# Patient Record
Sex: Female | Born: 1945 | Race: White | Hispanic: No | Marital: Married | Smoking: Never smoker
Health system: Southern US, Community
[De-identification: ages and names within clinical notes are randomized; demographics above are authoritative.]

## PROBLEM LIST (undated history)

## (undated) DIAGNOSIS — K219 Gastro-esophageal reflux disease without esophagitis: Secondary | ICD-10-CM

## (undated) DIAGNOSIS — I10 Essential (primary) hypertension: Secondary | ICD-10-CM

## (undated) DIAGNOSIS — G4700 Insomnia, unspecified: Secondary | ICD-10-CM

## (undated) DIAGNOSIS — E785 Hyperlipidemia, unspecified: Secondary | ICD-10-CM

## (undated) DIAGNOSIS — K559 Vascular disorder of intestine, unspecified: Secondary | ICD-10-CM

## (undated) DIAGNOSIS — T7840XA Allergy, unspecified, initial encounter: Secondary | ICD-10-CM

## (undated) DIAGNOSIS — M8580 Other specified disorders of bone density and structure, unspecified site: Secondary | ICD-10-CM

## (undated) HISTORY — DX: Other specified disorders of bone density and structure, unspecified site: M85.80

## (undated) HISTORY — DX: Essential (primary) hypertension: I10

## (undated) HISTORY — DX: Gastro-esophageal reflux disease without esophagitis: K21.9

## (undated) HISTORY — DX: Allergy, unspecified, initial encounter: T78.40XA

## (undated) HISTORY — DX: Vascular disorder of intestine, unspecified: K55.9

## (undated) HISTORY — PX: BREAST EXCISIONAL BIOPSY: SUR124

## (undated) HISTORY — PX: EYE SURGERY: SHX253

## (undated) HISTORY — PX: BREAST SURGERY: SHX581

## (undated) HISTORY — DX: Insomnia, unspecified: G47.00

## (undated) HISTORY — PX: BREAST BIOPSY: SHX20

## (undated) HISTORY — DX: Hyperlipidemia, unspecified: E78.5

## (undated) HISTORY — PX: FINGER SURGERY: SHX640

## (undated) HISTORY — PX: APPENDECTOMY: SHX54

---

## 2004-01-04 HISTORY — PX: COLONOSCOPY: SHX174

## 2004-01-26 ENCOUNTER — Emergency Department (HOSPITAL_COMMUNITY): Admission: EM | Admit: 2004-01-26 | Discharge: 2004-01-26 | Payer: Self-pay | Admitting: Emergency Medicine

## 2004-05-10 ENCOUNTER — Other Ambulatory Visit: Admission: RE | Admit: 2004-05-10 | Discharge: 2004-05-10 | Payer: Self-pay | Admitting: Family Medicine

## 2004-06-28 ENCOUNTER — Ambulatory Visit: Payer: Self-pay | Admitting: Internal Medicine

## 2004-06-28 ENCOUNTER — Ambulatory Visit (HOSPITAL_COMMUNITY): Admission: RE | Admit: 2004-06-28 | Discharge: 2004-06-28 | Payer: Self-pay | Admitting: Internal Medicine

## 2005-01-20 ENCOUNTER — Other Ambulatory Visit: Admission: RE | Admit: 2005-01-20 | Discharge: 2005-01-20 | Payer: Self-pay | Admitting: Family Medicine

## 2005-02-09 ENCOUNTER — Encounter: Admission: RE | Admit: 2005-02-09 | Discharge: 2005-02-09 | Payer: Self-pay | Admitting: Family Medicine

## 2006-12-05 ENCOUNTER — Encounter: Admission: RE | Admit: 2006-12-05 | Discharge: 2006-12-05 | Payer: Self-pay | Admitting: Obstetrics

## 2007-07-02 ENCOUNTER — Ambulatory Visit: Payer: Self-pay | Admitting: Internal Medicine

## 2007-07-02 DIAGNOSIS — J309 Allergic rhinitis, unspecified: Secondary | ICD-10-CM | POA: Insufficient documentation

## 2007-07-02 DIAGNOSIS — K219 Gastro-esophageal reflux disease without esophagitis: Secondary | ICD-10-CM | POA: Insufficient documentation

## 2007-07-02 DIAGNOSIS — K5900 Constipation, unspecified: Secondary | ICD-10-CM | POA: Insufficient documentation

## 2009-01-03 DIAGNOSIS — K559 Vascular disorder of intestine, unspecified: Secondary | ICD-10-CM

## 2009-01-03 HISTORY — DX: Vascular disorder of intestine, unspecified: K55.9

## 2009-04-21 ENCOUNTER — Ambulatory Visit: Payer: Self-pay | Admitting: Family Medicine

## 2009-04-21 DIAGNOSIS — N959 Unspecified menopausal and perimenopausal disorder: Secondary | ICD-10-CM | POA: Insufficient documentation

## 2009-04-24 ENCOUNTER — Encounter: Admission: RE | Admit: 2009-04-24 | Discharge: 2009-04-24 | Payer: Self-pay | Admitting: Family Medicine

## 2009-05-13 ENCOUNTER — Ambulatory Visit: Payer: Self-pay | Admitting: Family Medicine

## 2009-05-13 ENCOUNTER — Ambulatory Visit: Payer: Self-pay | Admitting: Obstetrics & Gynecology

## 2009-05-13 DIAGNOSIS — J019 Acute sinusitis, unspecified: Secondary | ICD-10-CM | POA: Insufficient documentation

## 2009-05-14 LAB — CONVERTED CEMR LAB
ALT: 20 units/L (ref 0–35)
Albumin: 4.1 g/dL (ref 3.5–5.2)
Alkaline Phosphatase: 75 units/L (ref 39–117)
Glucose, Bld: 91 mg/dL (ref 70–99)
LDL Cholesterol: 93 mg/dL (ref 0–99)
Potassium: 4.6 meq/L (ref 3.5–5.3)
Sodium: 140 meq/L (ref 135–145)
TSH: 0.721 microintl units/mL (ref 0.350–4.500)
Total Bilirubin: 0.5 mg/dL (ref 0.3–1.2)
Total Protein: 7.6 g/dL (ref 6.0–8.3)
Triglycerides: 197 mg/dL — ABNORMAL HIGH (ref <150)
VLDL: 39 mg/dL (ref 0–40)

## 2010-01-03 HISTORY — PX: COLONOSCOPY: SHX174

## 2010-02-02 NOTE — Assessment & Plan Note (Signed)
Summary: SOB, sinusitis   Vital Signs:  Patient profile:   65 year old female Height:      64.5 inches Weight:      153 pounds O2 Sat:      96 % on Room air Temp:     98.2 degrees F oral Pulse rate:   86 / minute BP sitting:   142 / 85  (left arm) Cuff size:   regular  Vitals Entered By: Kathlene November (May 13, 2009 9:57 AM)  O2 Flow:  Room air CC: cough, chest congestion, nasal congestion, H/A for 2 days, bodyaches last week. Started last week   Primary Care Provider:  Nani Gasser, MD  CC:  cough, chest congestion, nasal congestion, H/A for 2 days, and bodyaches last week. Started last week.  History of Present Illness: cough, chest congestion, nasal congestion, H/A for 2 days, bodyaches last week. Started last week.  In bed for about 3 days because. Taking Advil and OTC medications.  INitally had some fever. Chest still feels raw.  Does feel SOB going up the steps today.  Was bitten by a tick a couple of weeks ago. Cough has not been keeping her awake at night. NO alleviating or exacerbating sxs.   Current Medications (verified): 1)  Prempro 0.3-1.5 Mg  Tabs (Conj Estrog-Medroxyprogest Ace) .Marland Kitchen.. 1 Once Daily 2)  Allegra-D 24 Hour 180-240 Mg  Tb24 (Fexofenadine-Pseudoephedrine) .Marland Kitchen.. 1 Once Daily 3)  Prilosec 20 Mg Cpdr (Omeprazole) .... Take One Tablet By Mouth Once A Day  Allergies (verified): No Known Drug Allergies  Comments:  Nurse/Medical Assistant: The patient's medications and allergies were reviewed with the patient and were updated in the Medication and Allergy Lists. Kathlene November (May 13, 2009 9:58 AM)  Physical Exam  General:  Well-developed,well-nourished,in no acute distress; alert,appropriate and cooperative throughout examination Head:  Normocephalic and atraumatic without obvious abnormalities. No apparent alopecia or balding. Eyes:  No corneal or conjunctival inflammation noted. EOMI. Perrla. Ears:  External ear exam shows no significant lesions or  deformities.  Otoscopic examination reveals clear canals, tympanic membranes are intact bilaterally without bulging, retraction, inflammation or discharge. Hearing is grossly normal bilaterally. Nose:  External nasal examination shows no deformity or inflammation. Nasal mucosa are pink and moist without lesions or exudates. Mouth:  Oral mucosa and oropharynx without lesions or exudates.  Teeth in good repair. Neck:  No deformities, masses, or tenderness noted. Lungs:  Rhonchi at the bases bilat.  normal respiratory effort and no intercostal retractions.  Good effort.  Heart:  Normal rate and regular rhythm. S1 and S2 normal without gallop, murmur, click, rub or other extra sounds. Skin:  no rashes.  no rashes.   Cervical Nodes:  No lymphadenopathy noted Psych:  Cognition and judgment appear intact. Alert and cooperative with normal attention span and concentration. No apparent delusions, illusions, hallucinations   Impression & Recommendations:  Problem # 1:  SINUSITIS - ACUTE-NOS (ICD-461.9)  Her updated medication list for this problem includes:    Allegra-d 24 Hour 180-240 Mg Tb24 (Fexofenadine-pseudoephedrine) .Marland Kitchen... 1 once daily    Zithromax Z-pak 250 Mg Tabs (Azithromycin) .Marland Kitchen... Take as directed.  Instructed on treatment. Call if symptoms persist or worsen. If not better in 5 days then will order CXR and pt can go downstairs for that. Encouraged her to seek prompt medical care if she becomes more SOB.    Complete Medication List: 1)  Prempro 0.3-1.5 Mg Tabs (Conj estrog-medroxyprogest ace) .Marland Kitchen.. 1 once daily 2)  Allegra-d  24 Hour 180-240 Mg Tb24 (Fexofenadine-pseudoephedrine) .Marland Kitchen.. 1 once daily 3)  Prilosec 20 Mg Cpdr (Omeprazole) .... Take one tablet by mouth once a day 4)  Zithromax Z-pak 250 Mg Tabs (Azithromycin) .... Take as directed.  Patient Instructions: 1)  IF not 50% better in 5 day please call us for a Chest xray.  Prescriptions: ZITHROMAX Z-PAK 250 MG TABS (AZITHROMYCIN)  Take as directed.  # 1 pack x 0   Entered and Authorized by:   Nani Gasser MD   Signed by:   Nani Gasser MD on 05/13/2009   Method used:   Printed then faxed to ...       [facility]       [address]       [place], [place]  [address]       Ph: [phone] or [phone]       Fax: [phone]   RxID:   [id number]

## 2010-02-02 NOTE — Assessment & Plan Note (Signed)
Summary: NOV: CPE   Vital Signs:  Patient profile:   65 year old female Height:      64.5 inches Weight:      153 pounds BMI:     25.95 Temp:     98.3 degrees F oral Pulse rate:   81 / minute BP sitting:   127 / 75  (left arm) Cuff size:   regular  Vitals Entered By: Kathlene November (April 21, 2009 8:33 AM) CC: NP- CPE no pap Is Patient Diabetic? No   Primary Care Provider:  Nani Gasser, MD  CC:  NP- CPE no pap.  History of Present Illness: NP- CPE no pap. No specific complaints. Says last CPE ws in 2009. Would like referal to GYN for her paps. SHe says she is supposed to have one every 6 months but last one was ove a year ago. Also says she is overdue for her mammogram. and often has cysts that are followed. Was also supposed to have a f/u in 6 months for that and she is a year overdue.    Habits & Providers  Alcohol-Tobacco-Diet     Alcohol drinks/day: <1     Alcohol type: wine     Tobacco Status: never  Exercise-Depression-Behavior     Does Patient Exercise: no     Have you felt down or hopeless? no     STD Risk: never     Drug Use: no     Seat Belt Use: always  Current Medications (verified): 1)  Prempro 0.3-1.5 Mg  Tabs (Conj Estrog-Medroxyprogest Ace) .Marland Kitchen.. 1 Once Daily 2)  Allegra-D 24 Hour 180-240 Mg  Tb24 (Fexofenadine-Pseudoephedrine) .Marland Kitchen.. 1 Once Daily 3)  Prilosec 20 Mg Cpdr (Omeprazole) .... Take One Tablet By Mouth Once A Day  Allergies (verified): No Known Drug Allergies  Past History:  Past Medical History: Last updated: 07/02/2007 Allergic rhinitis GERD  Past Surgical History: Appendectomy 1961 breast biopsy 1980s, 1996 benign growth removed from under 1999 Cryotherapy on the cervix  Colonoscopy.  2005 or  2006  Family History: Reviewed history from 07/02/2007 and no changes required. 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  Married for [employer] with one kids.   Never Smoked Alcohol use-yes Drug use-no Regular exercise-no 3 caffeinated drinks per day. Smoking Status:  never Does Patient Exercise:  no STD Risk:  never Drug Use:  no Seat Belt Use:  always  Review of Systems       No fever/sweats/weakness, unexplained weight loss/gain.  No vison changes.  No difficulty hearing/ringing in ears, + hay fever/allergies.  No chest pain/discomfort, palpitations.  No Br lump/nipple discharge.  + cough/wheeze.  + blood in BM, no nausea/vomiting/diarrhea.  No nighttime urination, leaking urine, unusual vaginal bleeding, discharge (penis or vagina).  No muscle/joint pain. No rash, change in mole.  No HA, memory loss.  No anxiety, sleep d/o, depression.  No easy bruising/bleeding, unexplained lump   Physical Exam  General:  Well-developed,well-nourished,in no acute distress; alert,appropriate and cooperative throughout examination Head:  Normocephalic and atraumatic without obvious abnormalities. No apparent alopecia or balding. Eyes:  No corneal or conjunctival inflammation noted. EOMI. Perrla.  Ears:  External ear exam shows no significant lesions or deformities.  Otoscopic examination reveals clear canals, tympanic membranes are intact bilaterally without bulging, retraction, inflammation or discharge. Hearing  is grossly normal bilaterally. Nose:  External nasal examination shows no deformity or inflammation. Nasal mucosa are pink and moist without lesions or exudates. Mouth:  Oral mucosa and oropharynx without lesions or exudates.  Teeth in good repair. Neck:  No deformities, masses, or tenderness noted. Chest Wall:  No deformities, masses, or tenderness noted. Breasts:  No mass, nodules, thickening, tenderness, bulging, retraction, inflamation, nipple discharge or skin changes noted.   Lungs:  Normal respiratory effort,  chest expands symmetrically. Lungs are clear to auscultation, no crackles or wheezes. Heart:  Normal rate and regular rhythm. S1 and S2 normal without gallop, murmur, click, rub or other extra sounds. Abdomen:  Bowel sounds positive,abdomen soft and non-tender without masses, organomegaly or hernias noted. Msk:  No deformity or scoliosis noted of thoracic or lumbar spine.   Pulses:  R and L carotid,radial,dorsalis pedis and posterior tibial pulses are full and equal bilaterally Extremities:  No clubbing, cyanosis, edema, or deformity noted with normal full range of motion of all joints.   Neurologic:  No cranial nerve deficits noted. Station and gait are normal.DTRs are symmetrical throughout. Sensory, motor and coordinative functions appear intact. Skin:  Cyst on DIP on the first finger on teh right hand.  Cervical Nodes:  No lymphadenopathy noted Axillary Nodes:  No palpable lymphadenopathy Psych:  Cognition and judgment appear intact. Alert and cooperative with normal attention span and concentration. No apparent delusions, illusions, hallucinations   Impression & Recommendations:  Problem # 1:  PREVENTIVE HEALTH CARE (ICD-V70.0)  Due for screeening labs.   Performed an EKG since she plans on starting an intense exercise program for weight loss aznd currently she is not very active. Denies any CP or SOB.  Father died MI at age 29.  EKG shows Rate of 63 bpm, no acute changes.   Schedule for DEXA. Given pneumovax and shingles vaccines today. Will schedule for diagnositic mamm[facility] [place].   Encourage daily calcium with vitamin D two times a day  Orders: T-Comprehensive Metabolic Panel [id number]) T-Lipid Profile ([id number]) T-TSH [id number]) T-Dual DXA Bone Density/ Axial (22025) T-Mammography, Diagnostic (bilateral) (42706)  Complete Medication List: 1)  Prempro 0.3-1.5 Mg Tabs (Conj estrog-medroxyprogest ace) .Marland Kitchen.. 1 once daily 2)  Allegra-d 24 Hour 180-240 Mg  Tb24 (Fexofenadine-pseudoephedrine) .Marland Kitchen.. 1 once daily 3)  Prilosec 20 Mg Cpdr (Omeprazole) .... Take one tablet by mouth once a day  Other Orders: Gynecologic Referral (Gyn) Zoster (Shingles) Vaccine Live [phone]) Admin 1st Vaccine (83151) Pneumococcal Vaccine (76160) Admin of Any Addtl Vaccine (73710)  Patient Instructions: 1)  Call [phone] to schedule your bone density test downstairs. Ask for the [facility] location.  2)  Can go to the lab Mon-Friday 8 AM - 5PM for fasting labs. Fast for 8 hours.  Can have water and take medications.  3)  Encourage daily calcium with vitamin D two times a day   TD Result Date:  01/03/2009 TD Result:  given TD Next Due:  10 yr Flex Sig Next Due:  Not Indicated Colonoscopy Result Date:  01/03/2006 Colonoscopy Result:  normal Colonoscopy Next Due:  10 yr Hemoccult Next Due:  Not Indicated   Immunizations Administered:  Zostavax # 1:    Vaccine Type: Zostavax    Site: left deltoid    Mfr: Merck    Dose: 0.44ml    Route: IM    Given by: Kathlene November    Exp. Date: 01/30/2010    Lot #: [id number]    VIS given: 10/15/04  given April 21, 2009.  Pneumonia Vaccine:    Vaccine Type: Pneumovax    Site: right deltoid    Mfr: Merck    Dose: 0.5 ml    Route: IM    Given by: Kathlene November    Exp. Date: 02/03/2010    Lot #: [id number]    VIS given: 08/01/95 version given April 21, 2009.

## 2010-05-18 NOTE — Assessment & Plan Note (Signed)
NAME:  Stacy Henry, Stacy Henry NO.:  [id number]   MEDICAL RECORD NO.:  [id number]          PATIENT TYPE:  POB   LOCATION:  [facility] [place]         FACILITY:  [facility]   PHYSICIAN:  Elsie Lincoln, MD      DATE OF BIRTH:  February 15, 1945   DATE OF SERVICE:  05/13/2009                                  CLINIC NOTE   The patient is a 65 year old G1, P44 female who presents for yearly exam.  The patient previously had gotten care from Dr. Clearance Coots.  She has been on  hormone replacement therapy about 10 years.  He has tried to wean her in  the past, but has been unsuccessful due to severe hot flashes.  She has  recently started taking the hormone replacement every other day.  I  explained to her that she is having peaks and drops in her estrogen  levels and she might be able to quit entirely and the patient will try.  She understands the risk of breast cancer and heart disease.  The  patient denies any postmenopausal bleeding.  She has been having a  leakage of urine with coughing due to her recent cold.  She has been  having to have a pad and has some perineal irritation.   PAST MEDICAL HISTORY:  New-onset arthritis, reflux.   PAST SURGICAL HISTORY:  Laparotomy for ruptured appendix, removal of  cyst in her tongue, and benign breast lump, removed.   OB HISTORY:  NSVD x1.   GYN HISTORY:  The patient has had an abnormal Pap smear, it sounds like  LEEP x2 in the recent history in past 15 years.  The patient will need  yearly Pap smears for the next 20 years after the last LEEP.  The  patient again is menopausal and has not had any postmenopausal bleeding.  The patient has never had any history of ovarian cyst, fibroid tumors,  endometriosis, or sexually transmitted diseases.   MEDICATIONS:  Prempro, over-the-counter Allegra-D, and Prevacid.   ALLERGIES:  None.   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  She lives with her  spouse.  She does not smoke.  She drinks alcohol socially.  She does not do drugs.  Has never been  sexually or physically abused.   FAMILY HISTORY:  Her brother has diabetes.  Her father and brother have  heart disease.  Her mother had high blood pressure and some aunt has  remote history of familial cancers, but no breast, colon, ovarian, or  uterine cancers in first-degree relatives.   SYSTEMIC REVIEW:  Positive weight gain in the past recent history,  weakness, fatigue, and fever due to her recent cough, hot flashes,  headache, cough, shortness of breath, muscle and joint pain, and vaginal  itching.   PHYSICAL EXAMINATION:  VITAL SIGNS:  Pulse 95, blood pressure 161/91 on  Allegra-D, weight 152, height 65 inches.  GENERAL:  Well nourished, well developed, in no apparent distress.  HEENT:  Normocephalic, atraumatic.  Good dentition.  Thyroid, no masses.  LUNGS:  Crackles bilaterally with scant wheezes.  HEART:  Regular rate and rhythm.  BREASTS:  No masses.  No lymphadenopathy.  No nipple discharge.  ABDOMEN:  Soft, nontender.  No organomegaly.  No hernia.  GENITALIA:  Perineum and enterogenous areas are very red and irritated  down by the rectum as well, otherwise tanner V.  Vagina pink, normal  rugae, well estrogenized.  Cervix closed, nontender.  Uterus and adnexa  are not well palpated, but nontender and no masses.  Rectovaginal,  nontender.  Stool in the vault.  No cystocele.  No rectocele.  No pelvic  organ prolapse.  No hemorrhoids.  EXTREMITIES:  No edema.   ASSESSMENT AND PLAN:  A 65 year old, well-woman exam.  1. Pap smear which will be required due to loop electrosurgical      excision procedures in the past 15 years.  2. Chest x-ray PA and lateral and the patient to follow up with [facility] today due to her crackles in her lungs.  3. The patient to come back in 3 weeks after she is not wearing pads      from leaking urine to see if the perineal irritation  is better.  4. Stop taking hormone replacement therapy and see if her hot flashes      are bearable.           ______________________________  Elsie Lincoln, MD     KL/MEDQ  D:  05/13/2009  T:  05/13/2009  Job:  [id number]

## 2010-05-21 NOTE — Op Note (Signed)
NAME:  Stacy Henry, Stacy Henry                 ACCOUNT NO.:  [id number]   MEDICAL RECORD NO.:  [id number]          PATIENT TYPE:  AMB   LOCATION:  DAY                           FACILITY:  [facility]   PHYSICIAN:  R. Roetta Sessions, M.D. DATE OF BIRTH:  1945/06/05   DATE OF PROCEDURE:  06/28/2004  DATE OF DISCHARGE:                                 OPERATIVE REPORT   PROCEDURES:  Screening colonoscopy.   INDICATIONS FOR PROCEDURE:  The patient is a 65 year old lady referred at  the courtesy of Dr. _Ibara_________ in [place], [place], for  colorectal cancer screening.  She has no GI symptoms.  There is no family  history of colorectal neoplasia.  Her questions were answered.  She is  agreeable.  Please see the documentation in the medical record.   PROCEDURE NOTE:  O2 saturation, blood pressure, pulse and respirations  monitored throughout the entire procedure.  Conscious sedation with Versed 5  mg IV and Demerol 75 mg IV in divided doses.   INSTRUMENT:  Olympus video chip system.   FINDINGS:  A digital rectal exam revealed no abnormalities.   ENDOSCOPIC FINDINGS:  Prep was good.   Rectum:  Examination of the rectal mucosa, including retroflexed view of the  anal verge revealed no abnormalities.   Colon:  The colonic mucosa was surveyed from the rectosigmoid junction to  the left transverse and right colon to the area of the appendiceal orifice,  ileocecal valve and cecum.  These structures were well seen and photographed  for the record.  From this level, the scope was slowly withdrawn.  All  previously mentioned mucosal surfaces were again seen.  The colonic mucosa  appeared normal.  The patient tolerated the procedure well and was reacted  at endoscopy.   IMPRESSION:  1.  Normal rectum.  2.  Normal colon.   RECOMMENDATIONS:  Repeat colonoscopy in 10 years.     RMR/MEDQ  D:  06/28/2004  T:  06/28/2004  Job:  [id number]

## 2010-07-09 ENCOUNTER — Other Ambulatory Visit: Payer: Self-pay | Admitting: Family Medicine

## 2011-01-13 ENCOUNTER — Other Ambulatory Visit: Payer: Self-pay | Admitting: Nurse Practitioner

## 2011-01-13 ENCOUNTER — Other Ambulatory Visit: Payer: Self-pay | Admitting: Family Medicine

## 2011-01-13 DIAGNOSIS — Z139 Encounter for screening, unspecified: Secondary | ICD-10-CM

## 2011-01-18 ENCOUNTER — Ambulatory Visit
Admission: RE | Admit: 2011-01-18 | Discharge: 2011-01-18 | Disposition: A | Discharge status: Home / Self Care | Payer: Managed Care, Other (non HMO) | Source: Ambulatory Visit | Attending: Family Medicine | Admitting: Family Medicine

## 2011-01-18 DIAGNOSIS — Z139 Encounter for screening, unspecified: Secondary | ICD-10-CM

## 2012-03-26 ENCOUNTER — Other Ambulatory Visit: Payer: Self-pay | Admitting: Family Medicine

## 2012-03-26 DIAGNOSIS — Z1231 Encounter for screening mammogram for malignant neoplasm of breast: Secondary | ICD-10-CM

## 2012-03-29 ENCOUNTER — Ambulatory Visit (HOSPITAL_BASED_OUTPATIENT_CLINIC_OR_DEPARTMENT_OTHER)
Admission: RE | Admit: 2012-03-29 | Discharge: 2012-03-29 | Disposition: A | Discharge status: Home / Self Care | Payer: Medicare Other | Source: Ambulatory Visit | Attending: Family Medicine | Admitting: Family Medicine

## 2012-03-29 DIAGNOSIS — Z1231 Encounter for screening mammogram for malignant neoplasm of breast: Secondary | ICD-10-CM | POA: Insufficient documentation

## 2012-08-06 ENCOUNTER — Encounter: Payer: Self-pay | Admitting: Family Medicine

## 2012-08-06 ENCOUNTER — Telehealth: Payer: Self-pay | Admitting: General Practice

## 2012-08-06 ENCOUNTER — Ambulatory Visit (INDEPENDENT_AMBULATORY_CARE_PROVIDER_SITE_OTHER): Payer: Medicare Other | Admitting: Family Medicine

## 2012-08-06 VITALS — BP 123/78 | HR 94 | Temp 98.6°F | Wt 155.2 lb

## 2012-08-06 DIAGNOSIS — L259 Unspecified contact dermatitis, unspecified cause: Secondary | ICD-10-CM

## 2012-08-06 MED ORDER — TRIAMCINOLONE ACETONIDE 0.1 % EX OINT: TOPICAL_OINTMENT | Freq: Two times a day (BID) | CUTANEOUS | Status: AC

## 2012-08-06 NOTE — Progress Notes (Signed)
Patient ID: Stacy Henry, female   DOB: 01-25-1945, 67 y.o.   MRN: [id number] SUBJECTIVE: CC: Chief Complaint  Patient presents with  . Rash    rash lower extremities for about 6 weeks and arms     HPI: Doesn't recall any chemical exposures.  She is having her house remodeled at present. There is  Sanding and painting going on.  No past medical history on file. No past surgical history on file. History   Social History  . Marital Status: Married    Spouse Name: N/A    Number of Children: N/A  . Years of Education: N/A   Occupational History  . Not on file.   Social History Main Topics  . Smoking status: Never Smoker   . Smokeless tobacco: Not on file  . Alcohol Use: Not on file  . Drug Use: Not on file  . Sexually Active: Not on file   Other Topics Concern  . Not on file   Social History Narrative  . No narrative on file   No family history on file. No current outpatient prescriptions on file prior to visit.   No current facility-administered medications on file prior to visit.   Allergies  Allergen Reactions  . Sulfa Antibiotics    Immunization History  Administered Date(s) Administered  . Pneumococcal Polysaccharide 04/21/2009  . Td 01/03/2009  . Zoster 04/21/2009   Prior to Admission medications   Medication Sig Start Date End Date Taking? Authorizing Provider  fexofenadine-pseudoephedrine (ALLEGRA-D) 60-120 MG per tablet Take 1 tablet by mouth 2 (two) times daily.   Yes Historical Provider, MD  ranitidine (ZANTAC) 150 MG tablet Take 150 mg by mouth 2 (two) times daily.   Yes Historical Provider, MD  triamcinolone ointment (KENALOG) 0.1 % Apply topically 2 (two) times daily. 08/06/12 08/16/12  Ileana Ladd, MD     ROS: As above in the HPI. All other systems are stable or negative.  OBJECTIVE: APPEARANCE:  Patient in no acute distress.The patient appeared well nourished and normally developed. Acyanotic. Waist: VITAL SIGNS:BP 123/78  Pulse  94  Temp(Src) 98.6 F (37 C) (Oral)  Wt 155 lb 3.2 oz (70.398 kg)  BMI 26.24 kg/m2 WF  SKIN: warm and  Dryred eczematous rash on the antecubital fossae and the popliteal fossae. Rash is red and scaly.  HEAD and Neck: without JVD, Head and scalp: normal Eyes:No scleral icterus. Fundi normal, eye movements normal. Ears: Auricle normal, canal normal, Tympanic membranes normal, insufflation normal. Nose: normal Throat: normal Neck & thyroid: normal  CHEST & LUNGS: Chest wall: normal Lungs: Clear  CVS: Reveals the PMI to be normally located. Regular rhythm, First and Second Heart sounds are normal,  absence of murmurs, rubs or gallops. Peripheral vasculature: Radial pulses: normal Dorsal pedis pulses: normal Posterior pulses: normal  ABDOMEN:  Appearance: normal Benign, no organomegaly, no masses, no Abdominal Aortic enlargement. No Guarding , no rebound. No Bruits. Bowel sounds: normal  RECTAL: N/A GU: N/A  EXTREMETIES: nonedematous. Both Femoral and Pedal pulses are normal.  MUSCULOSKELETAL:  Spine: normal Joints: intact  NEUROLOGIC: oriented to time,place and person; nonfocal. Strength is normal Sensory is normal Reflexes are normal Cranial Nerves are normal.  ASSESSMENT: Contact dermatitis - Plan: triamcinolone ointment (KENALOG) 0.1 %  PLAN:   Meds ordered this encounter  Medications  . fexofenadine-pseudoephedrine (ALLEGRA-D) 60-120 MG per tablet    Sig: Take 1 tablet by mouth 2 (two) times daily.  . ranitidine (ZANTAC) 150 MG tablet  Sig: Take 150 mg by mouth 2 (two) times daily.  Marland Kitchen triamcinolone ointment (KENALOG) 0.1 %    Sig: Apply topically 2 (two) times daily.    Dispense:  80 g    Refill:  0   Skin care.  Avoidance of the chemicals in the house and paints.  Return if symptoms worsen or fail to improve.  Kathleene Bergemann P. Modesto Charon, M.D.

## 2012-08-06 NOTE — Telephone Encounter (Signed)
appt made

## 2012-10-17 ENCOUNTER — Ambulatory Visit (INDEPENDENT_AMBULATORY_CARE_PROVIDER_SITE_OTHER): Payer: Medicare Other | Admitting: Family Medicine

## 2012-10-17 ENCOUNTER — Encounter: Payer: Self-pay | Admitting: Family Medicine

## 2012-10-17 VITALS — BP 128/75 | HR 69 | Temp 98.2°F | Ht 64.5 in | Wt 153.0 lb

## 2012-10-17 DIAGNOSIS — R300 Dysuria: Secondary | ICD-10-CM

## 2012-10-17 LAB — POCT UA - MICROSCOPIC ONLY: Yeast, UA: NEGATIVE

## 2012-10-17 LAB — POCT URINALYSIS DIPSTICK

## 2012-10-17 MED ORDER — CEPHALEXIN 500 MG PO CAPS: 500.0000 mg | ORAL_CAPSULE | Freq: Three times a day (TID) | ORAL | Status: DC

## 2012-10-17 NOTE — Progress Notes (Signed)
  Subjective:    Patient ID: Stacy Henry, female    DOB: 1945-01-13, 67 y.o.   MRN: [id number]  HPI DYSURIA Onset:  2 days  Description: dysuria, increased urinary frequency  Modifying factors: none  Symptoms Urgency:  yes Frequency: yes  Hesitancy:  yes Hematuria:  no Flank Pain:  no Fever: no Nausea/Vomiting:  no Missed LMP: no STD exposure: no Discharge: no Irritants: no Rash: no  Red Flags   More than 3 UTI's last 12 months:  no PMH of  Diabetes or Immunosuppression:  no Renal Disease/Calculi: no Urinary Tract Abnormality:  no Instrumentation or Trauma: no      Review of Systems  All other systems reviewed and are negative.       Objective:   Physical Exam  Constitutional: She appears well-developed and well-nourished.  HENT:  Head: Normocephalic and atraumatic.  Eyes: Conjunctivae are normal. Pupils are equal, round, and reactive to light.  Neck: Normal range of motion.  Cardiovascular: Normal rate and regular rhythm.   Pulmonary/Chest: Effort normal and breath sounds normal.  Abdominal: Soft. Bowel sounds are normal.  No flank pain  + suprapubic tenderness   Neurological: She is alert.  Skin: Skin is warm.          Assessment & Plan:  Dysuria - Plan: POCT urinalysis dipstick, POCT UA - Microscopic Only, cephALEXin (KEFLEX) 500 MG capsule, Urine culture  Will treat with keflex  Urine culture Discussed infectious and GU red flags  Follow up as needed.

## 2012-10-19 LAB — URINE CULTURE

## 2012-10-25 ENCOUNTER — Encounter: Payer: Self-pay | Admitting: Family Medicine

## 2012-10-25 ENCOUNTER — Ambulatory Visit (INDEPENDENT_AMBULATORY_CARE_PROVIDER_SITE_OTHER): Payer: Medicare Other | Admitting: Family Medicine

## 2012-10-25 VITALS — BP 127/82 | HR 72 | Temp 98.4°F | Ht 65.0 in | Wt 153.0 lb

## 2012-10-25 DIAGNOSIS — K219 Gastro-esophageal reflux disease without esophagitis: Secondary | ICD-10-CM

## 2012-10-25 DIAGNOSIS — Z136 Encounter for screening for cardiovascular disorders: Secondary | ICD-10-CM

## 2012-10-25 DIAGNOSIS — Z0000 Encounter for general adult medical examination without abnormal findings: Secondary | ICD-10-CM

## 2012-10-25 DIAGNOSIS — Z23 Encounter for immunization: Secondary | ICD-10-CM

## 2012-10-25 DIAGNOSIS — L659 Nonscarring hair loss, unspecified: Secondary | ICD-10-CM

## 2012-10-25 LAB — POCT GLYCOSYLATED HEMOGLOBIN (HGB A1C): Hemoglobin A1C: 5.6

## 2012-10-25 MED ORDER — OMEPRAZOLE 20 MG PO CPDR: 20.0000 mg | DELAYED_RELEASE_CAPSULE | Freq: Every day | ORAL | Status: DC

## 2012-10-25 MED ORDER — CALCIUM CARBONATE-VITAMIN D 500-200 MG-UNIT PO TABS: 1.0000 | ORAL_TABLET | Freq: Two times a day (BID) | ORAL | Status: DC

## 2012-10-25 NOTE — Progress Notes (Signed)
Patient will return next week for me to do a baseline EKG.

## 2012-10-25 NOTE — Progress Notes (Signed)
Subjective:     Stacy Henry is a 67 y.o. female and is here for a comprehensive physical exam. The patient reports No significant issues apart from reflux and some mild hair thinning noted by her stylist.   Reflux: Patient reports a history of reflux symptoms over the past 3 years. Has been using over-the-counter Zantac but with some improvement of symptoms. Has had some persistent indigestion that persisted throughout the day for several months. Despite medication. Patient does state that her diet is poor. She does eat a high amount of fatty foods as well as chocolate and coffee. Does take ibuprofen intermittently. Patient denies any diaphoresis or nausea. No exertional symptoms. Symptoms are related to food ingestion. He is otherwise active.  Hair thinning: Patient states that her Stacy Henry recent told her that she's had some hair thinning. The patient denies a prior history of this in the past. No prior history of alopecia. Patient denies being under stress. No fatigue, weight gain, excessive sleep. Mood stable.  History   Social History  . Marital Status: Married    Spouse Name: N/A    Number of Children: N/A  . Years of Education: N/A   Occupational History  . Not on file.   Social History Main Topics  . Smoking status: Never Smoker   . Smokeless tobacco: Never Used  . Alcohol Use: No  . Drug Use: No  . Sexual Activity: Not on file   Other Topics Concern  . Not on file   Social History Narrative  . No narrative on file   Health Maintenance  Topic Date Due  . Influenza Vaccine  08/03/2012  . Mammogram  03/30/2014  . Colonoscopy  01/04/2019  . Tetanus/tdap  01/04/2019  . Pneumococcal Polysaccharide Vaccine Age 48 And Over  Completed  . Zostavax  Completed    The following portions of the patient's history were reviewed and updated as appropriate: allergies, current medications, past family history, past medical history, past social history, past surgical history and  problem list.  Review of Systems Pertinent items are noted in HPI.   Objective:    BP 127/82  Pulse 72  Temp(Src) 98.4 F (36.9 C) (Oral)  Ht 5\' 5"  (1.651 m)  Wt 153 lb (69.4 kg)  BMI 25.46 kg/m2 General appearance: alert and cooperative Head: Normocephalic, without obvious abnormality, atraumatic Eyes: conjunctivae/corneas clear. PERRL, EOM's intact. Fundi benign. Ears: normal TM's and external ear canals both ears Lungs: clear to auscultation bilaterally Heart: regular rate and rhythm, S1, S2 normal, no murmur, click, rub or gallop Abdomen: soft, non-tender; bowel sounds normal; no masses,  no organomegaly Extremities: extremities normal, atraumatic, no cyanosis or edema Pulses: 2+ and symmetric Skin: Skin color, texture, turgor normal. No rashes or lesions    Assessment:   Otherwise normal female exam.      Plan:  Hair thinning - Plan: TSH, CBC, IBC panel, Sedimentation Rate, NMR, lipoprofile  Screening for cardiovascular condition - Plan: POCT A1C, Comprehensive metabolic panel, NMR, lipoprofile, CANCELED: NMR Lipoprofile with Lipids  GERD (gastroesophageal reflux disease) - Plan: omeprazole (PRILOSEC) 20 MG capsule, NMR, lipoprofile  Will check baseline risk stratification labs including TSH, CBC, CMET, lipoprofile.  Will also check iron panel and ESR given hair thinning. Encouraged multivitamin.  Start vitamin D.  Start on PPI. Discussed dietary and lifestyle changes. Sxs highly atypical for cardiac source as sxs have been present >3 years and related to post prandial food intake. No chest pressure, diaphoresis, nausea. No underlying cardiac  RFs apart from age and family history. Consider cardiac and/or GI workup if sxs persist despite treatment.    See After Visit Summary for Counseling Recommendations

## 2012-10-25 NOTE — Patient Instructions (Signed)
Gastroesophageal Reflux Disease, Adult  Gastroesophageal reflux disease (GERD) happens when acid from your stomach flows up into the esophagus. When acid comes in contact with the esophagus, the acid causes soreness (inflammation) in the esophagus. Over time, GERD may create small holes (ulcers) in the lining of the esophagus.  CAUSES   · Increased body weight. This puts pressure on the stomach, making acid rise from the stomach into the esophagus.  · Smoking. This increases acid production in the stomach.  · Drinking alcohol. This causes decreased pressure in the lower esophageal sphincter (valve or ring of muscle between the esophagus and stomach), allowing acid from the stomach into the esophagus.  · Late evening meals and a full stomach. This increases pressure and acid production in the stomach.  · A malformed lower esophageal sphincter.  Sometimes, no cause is found.  SYMPTOMS   · Burning pain in the lower part of the mid-chest behind the breastbone and in the mid-stomach area. This may occur twice a week or more often.  · Trouble swallowing.  · Sore throat.  · Dry cough.  · Asthma-like symptoms including chest tightness, shortness of breath, or wheezing.  DIAGNOSIS   Your caregiver may be able to diagnose GERD based on your symptoms. In some cases, X-rays and other tests may be done to check for complications or to check the condition of your stomach and esophagus.  TREATMENT   Your caregiver may recommend over-the-counter or prescription medicines to help decrease acid production. Ask your caregiver before starting or adding any new medicines.   HOME CARE INSTRUCTIONS   · Change the factors that you can control. Ask your caregiver for guidance concerning weight loss, quitting smoking, and alcohol consumption.  · Avoid foods and drinks that make your symptoms worse, such as:  · Caffeine or alcoholic drinks.  · Chocolate.  · Peppermint or mint flavorings.  · Garlic and onions.  · Spicy foods.  · Citrus fruits,  such as oranges, lemons, or limes.  · Tomato-based foods such as sauce, chili, salsa, and pizza.  · Fried and fatty foods.  · Avoid lying down for the 3 hours prior to your bedtime or prior to taking a nap.  · Eat small, frequent meals instead of large meals.  · Wear loose-fitting clothing. Do not wear anything tight around your waist that causes pressure on your stomach.  · Raise the head of your bed 6 to 8 inches with wood blocks to help you sleep. Extra pillows will not help.  · Only take over-the-counter or prescription medicines for pain, discomfort, or fever as directed by your caregiver.  · Do not take aspirin, ibuprofen, or other nonsteroidal anti-inflammatory drugs (NSAIDs).  SEEK IMMEDIATE MEDICAL CARE IF:   · You have pain in your arms, neck, jaw, teeth, or back.  · Your pain increases or changes in intensity or duration.  · You develop nausea, vomiting, or sweating (diaphoresis).  · You develop shortness of breath, or you faint.  · Your vomit is green, yellow, black, or looks like coffee grounds or blood.  · Your stool is red, bloody, or black.  These symptoms could be signs of other problems, such as heart disease, gastric bleeding, or esophageal bleeding.  MAKE SURE YOU:   · Understand these instructions.  · Will watch your condition.  · Will get help right away if you are not doing well or get worse.  Document Released: 09/29/2004 Document Revised: 03/14/2011 Document Reviewed: 07/09/2010  [employer]® Patient   Information ©2014 [employer].

## 2012-10-27 LAB — NMR, LIPOPROFILE
LDL Particle Number: 1129 nmol/L — ABNORMAL HIGH (ref <1000)
LDL Size: 21.5 nm (ref >20.5)
LDLC SERPL CALC-MCNC: 113 mg/dL — ABNORMAL HIGH (ref <100)
LP-IR Score: 34 (ref <=45)

## 2012-10-27 LAB — COMPREHENSIVE METABOLIC PANEL
ALT: 21 IU/L (ref 0–32)
Albumin: 4.2 g/dL (ref 3.6–4.8)
Alkaline Phosphatase: 69 IU/L (ref 39–117)
BUN: 26 mg/dL (ref 8–27)
CO2: 26 mmol/L (ref 18–29)
Chloride: 103 mmol/L (ref 97–108)
GFR calc Af Amer: 73 mL/min/{1.73_m2} (ref >59)
Glucose: 98 mg/dL (ref 65–99)
Potassium: 4.4 mmol/L (ref 3.5–5.2)
Total Bilirubin: 0.4 mg/dL (ref 0.0–1.2)
Total Protein: 7.2 g/dL (ref 6.0–8.5)

## 2012-10-27 LAB — CBC
Hemoglobin: 14.2 g/dL (ref 11.1–15.9)
MCHC: 33.6 g/dL (ref 31.5–35.7)
RDW: 13.8 % (ref 12.3–15.4)
WBC: 7.6 10*3/uL (ref 3.4–10.8)

## 2012-10-27 LAB — TSH: TSH: 0.725 u[IU]/mL (ref 0.450–4.500)

## 2012-10-27 LAB — SEDIMENTATION RATE: Sed Rate: 6 mm/hr (ref 0–40)

## 2013-04-01 ENCOUNTER — Other Ambulatory Visit: Payer: Self-pay

## 2013-04-01 DIAGNOSIS — Z1231 Encounter for screening mammogram for malignant neoplasm of breast: Secondary | ICD-10-CM

## 2013-04-12 ENCOUNTER — Ambulatory Visit
Admission: RE | Admit: 2013-04-12 | Discharge: 2013-04-12 | Disposition: A | Discharge status: Home / Self Care | Payer: Medicare Other | Source: Ambulatory Visit

## 2013-04-12 DIAGNOSIS — Z1231 Encounter for screening mammogram for malignant neoplasm of breast: Secondary | ICD-10-CM

## 2013-04-19 ENCOUNTER — Other Ambulatory Visit: Payer: Self-pay | Admitting: Obstetrics

## 2013-04-19 DIAGNOSIS — E2839 Other primary ovarian failure: Secondary | ICD-10-CM

## 2013-04-22 ENCOUNTER — Ambulatory Visit
Admission: RE | Admit: 2013-04-22 | Discharge: 2013-04-22 | Disposition: A | Discharge status: Home / Self Care | Payer: Self-pay | Source: Ambulatory Visit | Attending: Obstetrics | Admitting: Obstetrics

## 2013-04-22 DIAGNOSIS — E2839 Other primary ovarian failure: Secondary | ICD-10-CM

## 2013-06-22 ENCOUNTER — Ambulatory Visit (INDEPENDENT_AMBULATORY_CARE_PROVIDER_SITE_OTHER): Payer: Medicare Other | Admitting: Nurse Practitioner

## 2013-06-22 ENCOUNTER — Encounter: Payer: Self-pay | Admitting: Nurse Practitioner

## 2013-06-22 VITALS — BP 128/74 | HR 94 | Temp 97.1°F | Ht 65.0 in | Wt 154.8 lb

## 2013-06-22 DIAGNOSIS — R300 Dysuria: Secondary | ICD-10-CM

## 2013-06-22 DIAGNOSIS — N3000 Acute cystitis without hematuria: Secondary | ICD-10-CM

## 2013-06-22 LAB — POCT URINALYSIS DIPSTICK

## 2013-06-22 LAB — POCT UA - MICROSCOPIC ONLY

## 2013-06-22 MED ORDER — CIPROFLOXACIN HCL 500 MG PO TABS: 500.0000 mg | ORAL_TABLET | Freq: Two times a day (BID) | ORAL | Status: DC

## 2013-06-22 NOTE — Patient Instructions (Signed)

## 2013-06-22 NOTE — Progress Notes (Signed)
   Subjective:    Patient ID: Stacy Henry, female    DOB: Dec 30, 1945, 68 y.o.   MRN: [id number]  HPI Patient in today c/o dysuria- started Thursday and has gotten worse- Has been taking AZO. She also has a sore place on her left shin area- thinks it is poison oak- very itchy.    Review of Systems  Respiratory: Negative.   Cardiovascular: Negative.   Genitourinary: Positive for dysuria, urgency and frequency.  Skin: Positive for wound.  All other systems reviewed and are negative.      Objective:   Physical Exam  Constitutional: She is oriented to person, place, and time. She appears well-developed and well-nourished.  Cardiovascular: Normal rate, regular rhythm and normal heart sounds.   Pulmonary/Chest: Effort normal and breath sounds normal.  Abdominal: Soft. Bowel sounds are normal. She exhibits no distension. Tenderness: suprapubic pain.  Genitourinary: Vagina normal and uterus normal.  No CVA Tenderness  Neurological: She is alert and oriented to person, place, and time.  Skin: Skin is warm and dry.  Psychiatric: She has a normal mood and affect. Her behavior is normal. Judgment and thought content normal.   BP 128/74  Pulse 94  Temp(Src) 97.1 F (36.2 C) (Oral)  Ht 5\' 5"  (1.651 m)  Wt 154 lb 12.8 oz (70.217 kg)  BMI 25.76 kg/m2   Results for orders placed in visit on 06/22/13  POCT URINALYSIS DIPSTICK      Result Value Ref Range   Color, UA interference     Clarity, UA interference     Glucose, UA interference     Bilirubin, UA interferenceinterference     Ketones, UA interference     Spec Grav, UA       Blood, UA interference     pH, UA       Protein, UA       Urobilinogen, UA       Nitrite, UA interference     Leukocytes, UA      POCT UA - MICROSCOPIC ONLY      Result Value Ref Range   WBC, Ur, HPF, POC tntc     RBC, urine, microscopic tntc     Bacteria, U Microscopic occasional     Mucus, UA none     Epithelial cells, urine per micros few       Crystals, Ur, HPF, POC none     Casts, Ur, LPF, POC none     Yeast, UA none          Assessment & Plan:  1. Dysuria - POCT urinalysis dipstick - POCT UA - Microscopic Only  2. Acute cystitis without hematuria Force fluids AZO over the counter X2 days RTO prn Culture pending - Urine culture - ciprofloxacin (CIPRO) 500 MG tablet; Take 1 tablet (500 mg total) by mouth 2 (two) times daily.   Dispense: 14 tablet; Refill: 0  Mary-Margaret Hassell Done, FNP

## 2013-06-24 ENCOUNTER — Telehealth: Payer: Self-pay | Admitting: Nurse Practitioner

## 2013-06-24 NOTE — Telephone Encounter (Signed)
Did not look like spider bite in Saturday and that is not actyally why she was herre. But ER is fine

## 2013-06-24 NOTE — Telephone Encounter (Signed)
PATIENT IS GOING TO THE ER

## 2013-06-26 LAB — URINE CULTURE

## 2013-07-31 ENCOUNTER — Other Ambulatory Visit: Payer: Self-pay | Admitting: Family Medicine

## 2013-10-21 ENCOUNTER — Encounter: Payer: Self-pay | Admitting: Family Medicine

## 2013-10-21 ENCOUNTER — Ambulatory Visit (INDEPENDENT_AMBULATORY_CARE_PROVIDER_SITE_OTHER): Payer: Medicare Other | Admitting: Family Medicine

## 2013-10-21 VITALS — BP 127/80 | HR 84 | Temp 96.5°F | Ht 65.0 in | Wt 156.5 lb

## 2013-10-21 DIAGNOSIS — N390 Urinary tract infection, site not specified: Secondary | ICD-10-CM

## 2013-10-21 DIAGNOSIS — R300 Dysuria: Secondary | ICD-10-CM

## 2013-10-21 LAB — POCT URINALYSIS DIPSTICK
Bilirubin, UA: NEGATIVE
Blood, UA: NEGATIVE
Glucose, UA: NEGATIVE
Ketones, UA: NEGATIVE
Nitrite, UA: POSITIVE
Spec Grav, UA: 1.02
Urobilinogen, UA: NEGATIVE
pH, UA: 6

## 2013-10-21 LAB — POCT UA - MICROSCOPIC ONLY
Casts, Ur, LPF, POC: NEGATIVE
Crystals, Ur, HPF, POC: NEGATIVE
Yeast, UA: NEGATIVE

## 2013-10-21 MED ORDER — CIPROFLOXACIN HCL 500 MG PO TABS: 500.0000 mg | ORAL_TABLET | Freq: Two times a day (BID) | ORAL | Status: DC

## 2013-10-21 NOTE — Progress Notes (Signed)
   Subjective:    Patient ID: Stacy Henry, female    DOB: 25-Apr-1945, 68 y.o.   MRN: [id number]  HPI  This 68 y.o. female presents for evaluation of Urinary sx's.  Review of Systems    No chest pain, SOB, HA, dizziness, vision change, N/V, diarrhea, constipation, myalgias, arthralgias or rash.  Objective:   Physical Exam Vital signs noted  Well developed well nourished female.  HEENT - Head atraumatic Normocephalic                Eyes - PERRLA, Conjuctiva - clear Sclera- Clear EOMI                Ears - EAC's Wnl TM's Wnl Gross Hearing WNL                Nose - Nares patent                 Throat - oropharanx wnl Respiratory - Lungs CTA bilateral Cardiac - RRR S1 and S2 without murmur GI - Abdomen soft Nontender and bowel sounds active x 4 Extremities - No edema. Neuro - Grossly intact.  Results for orders placed in visit on 10/21/13  POCT UA - MICROSCOPIC ONLY      Result Value Ref Range   WBC, Ur, HPF, POC 10-15     RBC, urine, microscopic 5-8     Bacteria, U Microscopic moderate     Mucus, UA moderate     Epithelial cells, urine per micros moderate     Crystals, Ur, HPF, POC negative     Casts, Ur, LPF, POC negative     Yeast, UA negative    POCT URINALYSIS DIPSTICK      Result Value Ref Range   Color, UA gold     Clarity, UA clear     Glucose, UA negative     Bilirubin, UA negative     Ketones, UA negative     Spec Grav, UA 1.020     Blood, UA negative     pH, UA 6.0     Protein, UA trace     Urobilinogen, UA negative     Nitrite, UA positive     Leukocytes, UA large (3+)         Assessment & Plan:  Dysuria - Plan: POCT UA - Microscopic Only, POCT urinalysis dipstick, ciprofloxacin (CIPRO) 500 MG tablet, Urine culture  Urinary tract infection without hematuria, site unspecified - Plan: ciprofloxacin (CIPRO) 500 MG tablet, Urine culture  Lysbeth Penner FNP

## 2013-10-23 LAB — URINE CULTURE

## 2013-12-03 ENCOUNTER — Other Ambulatory Visit (INDEPENDENT_AMBULATORY_CARE_PROVIDER_SITE_OTHER): Payer: Medicare Other

## 2013-12-03 ENCOUNTER — Telehealth: Payer: Self-pay | Admitting: Family Medicine

## 2013-12-03 ENCOUNTER — Other Ambulatory Visit: Payer: Self-pay | Admitting: Family Medicine

## 2013-12-03 DIAGNOSIS — N390 Urinary tract infection, site not specified: Secondary | ICD-10-CM

## 2013-12-03 DIAGNOSIS — R300 Dysuria: Secondary | ICD-10-CM

## 2013-12-03 DIAGNOSIS — R319 Hematuria, unspecified: Secondary | ICD-10-CM

## 2013-12-03 LAB — POCT UA - MICROSCOPIC ONLY
Casts, Ur, LPF, POC: NEGATIVE
Crystals, Ur, HPF, POC: NEGATIVE
Mucus, UA: NEGATIVE
Yeast, UA: NEGATIVE

## 2013-12-03 LAB — POCT URINALYSIS DIPSTICK

## 2013-12-03 MED ORDER — CIPROFLOXACIN HCL 500 MG PO TABS: 500.0000 mg | ORAL_TABLET | Freq: Two times a day (BID) | ORAL | Status: DC

## 2013-12-03 NOTE — Telephone Encounter (Signed)
Patient to come by and leave urine specimen to check for dysuria.  We will have provider check results.

## 2013-12-06 LAB — URINE CULTURE

## 2013-12-07 ENCOUNTER — Other Ambulatory Visit: Payer: Self-pay | Admitting: Family Medicine

## 2013-12-13 ENCOUNTER — Encounter: Payer: Self-pay | Admitting: Pharmacist

## 2013-12-13 ENCOUNTER — Ambulatory Visit (INDEPENDENT_AMBULATORY_CARE_PROVIDER_SITE_OTHER): Payer: Medicare Other | Admitting: Pharmacist

## 2013-12-13 ENCOUNTER — Encounter (INDEPENDENT_AMBULATORY_CARE_PROVIDER_SITE_OTHER): Payer: Self-pay

## 2013-12-13 VITALS — BP 122/82 | HR 80 | Ht 64.5 in | Wt 156.0 lb

## 2013-12-13 DIAGNOSIS — Z0000 Encounter for general adult medical examination without abnormal findings: Secondary | ICD-10-CM

## 2013-12-13 DIAGNOSIS — M8580 Other specified disorders of bone density and structure, unspecified site: Secondary | ICD-10-CM

## 2013-12-13 DIAGNOSIS — E7800 Pure hypercholesterolemia, unspecified: Secondary | ICD-10-CM

## 2013-12-13 LAB — POCT GLYCOSYLATED HEMOGLOBIN (HGB A1C): Hemoglobin A1C: 5.6

## 2013-12-13 MED ORDER — ALIGN PO CAPS: 1.0000 | ORAL_CAPSULE | Freq: Every day | ORAL | Status: DC

## 2013-12-13 MED ORDER — PANTOPRAZOLE SODIUM 40 MG PO TBEC: 40.0000 mg | DELAYED_RELEASE_TABLET | Freq: Every day | ORAL | Status: DC

## 2013-12-13 NOTE — Progress Notes (Signed)
Patient ID: Stacy Henry, female   DOB: 01-30-1945, 68 y.o.   MRN: [id number]  Subjective:    Stacy Henry is a 68 y.o. female who presents for Medicare Initial Wellness Visit.  Preventive Screening-Counseling & Management  Tobacco History  Smoking status  . Never Smoker   Smokeless tobacco  . Never Used    Current Problems (verified) Patient Active Problem List   Diagnosis Date Noted  . SINUSITIS - ACUTE-NOS 05/13/2009  . POSTMENOPAUSAL SYNDROME 04/21/2009  . ALLERGIC RHINITIS 07/02/2007  . GERD 07/02/2007  . CONSTIPATION 07/02/2007    Medications Prior to Visit Current Outpatient Prescriptions on File Prior to Visit  Medication Sig Dispense Refill  . fexofenadine-pseudoephedrine (ALLEGRA-D) 60-120 MG per tablet Take 1 tablet by mouth daily.     . calcium-vitamin D (OSCAL WITH D) 500-200 MG-UNIT per tablet Take 1 tablet by mouth 2 (two) times daily. (Patient not taking: Reported on 12/13/2013) 60 tablet 6   No current facility-administered medications on file prior to visit.   patietn mentions that she has been prescribed omeprazole 26m but she has taken her husbands pantoprazole 464mand it has worked much better to control GERD symptoms.   Current Medications (verified) Current Outpatient Prescriptions  Medication Sig Dispense Refill  . fexofenadine-pseudoephedrine (ALLEGRA-D) 60-120 MG per tablet Take 1 tablet by mouth daily.     . bifidobacterium infantis (ALIGN) capsule Take 1 capsule by mouth daily.    . calcium-vitamin D (OSCAL WITH D) 500-200 MG-UNIT per tablet Take 1 tablet by mouth 2 (two) times daily. (Patient not taking: Reported on 12/13/2013) 60 tablet 6  . Multiple Vitamins-Minerals (THERA-M) TABS Take 1 tablet by mouth.    . pantoprazole (PROTONIX) 40 MG tablet Take 1 tablet (40 mg total) by mouth daily. 90 tablet 1   No current facility-administered medications for this visit.     Allergies (verified) Sulfa antibiotics   PAST  HISTORY  Family History Family History  Problem Relation Age of Onset  . Alcohol abuse Sister   . Diabetes Brother   . Heart attack Brother 3563. Diabetes Brother   . Cirrhosis Brother   . Alcohol abuse Brother   . Hypertension Mother   . Atrial fibrillation Mother   . Heart attack Father   . Kidney disease Sister   . Atrial fibrillation Sister     Social History History  Substance Use Topics  . Smoking status: Never Smoker   . Smokeless tobacco: Never Used  . Alcohol Use: Yes     Comment: ocassional wine - less than weekly     Are there smokers in your home (other than you)? No  Risk Factors Current exercise habits: walking once weekly - needs to increase  Dietary issues discussed: limiting sweets   Cardiac risk factors: advanced age (older than 5560or men, 6535or women).  Depression Screen (Note: if answer to either of the following is "Yes", a more complete depression screening is indicated)   Over the past 2 weeks, have you felt down, depressed or hopeless? No  Over the past 2 weeks, have you felt little interest or pleasure in doing things? No  Have you lost interest or pleasure in daily life? No  Do you often feel hopeless? No  Do you cry easily over simple problems? No  Activities of Daily Living In your present state of health, do you have any difficulty performing the following activities?:  Driving? No Managing money?  No Feeding yourself? No  Getting from bed to chair? No  Climbing a flight of stairs? No Preparing food and eating?: No Bathing or showering? No Getting dressed: No Getting to the toilet? No Using the toilet:No Moving around from place to place: No In the past year have you fallen or had a near fall?:No   Are you sexually active?  Yes  Do you have more than one partner?  No  Hearing Difficulties: No Do you often ask people to speak up or repeat themselves? No Do you experience ringing or noises in your ears? No Do you have  difficulty understanding soft or whispered voices? No   Do you feel that you have a problem with memory? No  Do you often misplace items? No  Do you feel safe at home?  Yes  Cognitive Testing  Alert? Yes  Normal Appearance?Yes  Oriented to person? Yes  Place? Yes   Time? Yes  Recall of three objects?  Yes  Can perform simple calculations? Yes  Displays appropriate judgment?Yes  Can read the correct time from a watch face?Yes   Advanced Directives have been discussed with the patient? Yes  List the Names of Other Physician/Practitioners you currently use: 1.  Dr Mcarthur Rossetti - optometrist  Indicate any recent Medical Services you may have received from other than [facility] providers in the past year (date may be approximate).  Immunization History  Administered Date(s) Administered  . Influenza,inj,Quad PF,36+ Mos 10/25/2012  . Pneumococcal Polysaccharide-23 04/21/2009  . Td 01/03/2009  . Zoster 04/21/2009    Screening Tests Health Maintenance  Topic Date Due  . INFLUENZA VACCINE  08/03/2013  . MAMMOGRAM  04/13/2015  . COLONOSCOPY  01/04/2019  . TETANUS/TDAP  01/04/2019  . PNEUMOCOCCAL POLYSACCHARIDE VACCINE AGE 70 AND OVER  Completed  . ZOSTAVAX  Completed    All answers were reviewed with the patient and necessary referrals were made:  Stacy Henry, Stacy Henry   12/13/2013   History reviewed: allergies, current medications, past family history, past medical history, past social history, past surgical history and problem list   Patient c/o increased constipation that she feels has been worsened by 3 rounds of ABX over the last 6 months  Objective:     Body mass index is 26.37 kg/(m^2). BP 122/82 mmHg  Pulse 80  Ht 5' 4.5" (1.638 m)  Wt 156 lb (70.761 kg)  BMI 26.37 kg/m2   A1c was 5.6% 10/25/2012 Checked A1c today and was 5.6% today as well    Assessment:     Initial Medicare Annual Wellness Visit Constipation GERD osteopenia     Plan:     During the  course of the visit the patient was educated and counseled about appropriate screening and preventive services including:    Pneumococcal vaccine - plan to get in 2 weeks since just finished ABX yesterday   Influenza vaccine - appt to get in 2 weeks since just finished ABX yesterday  Td vaccine - UTD  Screening mammography - UTD  Screening Pap smear and pelvic exam  -UTD  Bone densitometry screening - UTD.  Patient was unaware of last DEXA results.  Reviewed with patient.  Discussed fracture risk.  Recommended calcium 1219m daily through dietary intake and supplementation. Recommended weight bearing exercise 30 minutes 5 days per week.  Colorectal cancer screening - UTD  Diabetes screening - checked FBG and A1c today  Glaucoma screening - has visit schedule with optometrist next week.   Advanced directives: has NO advanced directive - patient states she  already has packet at home  Recommended she take [place] or other probiotic once daily for bowel health given several courses of ABX this year  Referral made for dermatologist for suspicious moles  Change omeprazole to pantoprazole 58m 1 tablet daily  Orders Placed This Encounter  Procedures  . CMP14+EGFR  . NMR, lipoprofile  . Vit D  25 hydroxy (rtn osteoporosis monitoring)  . Ambulatory referral to Dermatology    Referral Priority:  Routine    Referral Type:  Consultation    Referral Reason:  Specialty Services Required    Requested Specialty:  Dermatology    Number of Visits Requested:  1  . POCT glycosylated hemoglobin (Hb A1C)     Diet review for nutrition referral? Yes ____  Not Indicated __X__   Patient Instructions (the written plan) was given to the patient.  Medicare Attestation I have personally reviewed: The patient's medical and social history Their use of alcohol, tobacco or illicit drugs Their current medications and supplements The patient's functional ability including ADLs,fall risks, home safety  risks, cognitive, and hearing and visual impairment Diet and physical activities Evidence for depression or mood disorders  The patient's weight, height, BMI, and BP/HR have been recorded in the chart.  I have made referrals, counseling, and provided education to the patient based on review of the above and I have provided the patient with a written personalized care plan for preventive services.     [facility]  12/13/2013

## 2013-12-13 NOTE — Patient Instructions (Addendum)
Health Maintenance Summary     INFLUENZA VACCINE Appt to get  12/2013     MAMMOGRAM Next Due 04/13/2014 Last 04/12/2013    COLONOSCOPY Next Due 01/04/2019 Last 01/03/2009   Pneumonia Appt to get Prevnar 13 12/2013    Bone Density Next Due 04/23/2015 04/22/2013   Zostavax  Completed 2011     TETANUS/TDAP Next Due 01/04/2019 Last 01/03/2009      Preventive Care for Adults A healthy lifestyle and preventive care can promote health and wellness. Preventive health guidelines for women include the following key practices.  A routine yearly physical is a good way to check with your health care provider about your health and preventive screening. It is a chance to share any concerns and updates on your health and to receive a thorough exam.  Visit your dentist for a routine exam and preventive care every 6 months. Brush your teeth twice a day and floss once a day. Good oral hygiene prevents tooth decay and gum disease.  The frequency of eye exams is based on your age, health, family medical history, use of contact lenses, and other factors. Follow your health care provider's recommendations for frequency of eye exams.  Eat a healthy diet. Foods like vegetables, fruits, whole grains, low-fat dairy products, and lean protein foods contain the nutrients you need without too many calories. Decrease your intake of foods high in solid fats, added sugars, and salt. Eat the right amount of calories for you.Get information about a proper diet from your health care provider, if necessary.  Regular physical exercise is one of the most important things you can do for your health. Most adults should get at least 150 minutes of moderate-intensity exercise (any activity that increases your heart rate and causes you to sweat) each week. In addition, most adults need muscle-strengthening exercises on 2 or more days a week.  Maintain a healthy weight. The body mass index (BMI) is a screening tool to identify possible  weight problems. It provides an estimate of body fat based on height and weight. Your health care provider can find your BMI and can help you achieve or maintain a healthy weight.For adults 20 years and older:  A BMI below 18.5 is considered underweight.  A BMI of 18.5 to 24.9 is normal.  A BMI of 25 to 29.9 is considered overweight.  A BMI of 30 and above is considered obese.  Maintain normal blood lipids and cholesterol levels by exercising and minimizing your intake of saturated fat. Eat a balanced diet with plenty of fruit and vegetables. Blood tests for lipids and cholesterol should begin at age 74 and be repeated every 5 years. If your lipid or cholesterol levels are high, you are over 50, or you are at high risk for heart disease, you may need your cholesterol levels checked more frequently.Ongoing high lipid and cholesterol levels should be treated with medicines if diet and exercise are not working.  If you smoke, find out from your health care provider how to quit. If you do not use tobacco, do not start.  Lung cancer screening is recommended for adults aged 48-80 years who are at high risk for developing lung cancer because of a history of smoking. A yearly low-dose CT scan of the lungs is recommended for people who have at least a 30-pack-year history of smoking and are a current smoker or have quit within the past 15 years. A pack year of smoking is smoking an average of 1 pack of  cigarettes a day for 1 year (for example: 1 pack a day for 30 years or 2 packs a day for 15 years). Yearly screening should continue until the smoker has stopped smoking for at least 15 years. Yearly screening should be stopped for people who develop a health problem that would prevent them from having lung cancer treatment.  If you are pregnant, do not drink alcohol. If you are breastfeeding, be very cautious about drinking alcohol. If you are not pregnant and choose to drink alcohol, do not have more than 1  drink per day. One drink is considered to be 12 ounces (355 mL) of beer, 5 ounces (148 mL) of wine, or 1.5 ounces (44 mL) of liquor.  Avoid use of street drugs. Do not share needles with anyone. Ask for help if you need support or instructions about stopping the use of drugs.  High blood pressure causes heart disease and increases the risk of stroke. Your blood pressure should be checked at least every 1 to 2 years. Ongoing high blood pressure should be treated with medicines if weight loss and exercise do not work.  If you are 59-62 years old, ask your health care provider if you should take aspirin to prevent strokes.  Diabetes screening involves taking a blood sample to check your fasting blood sugar level. This should be done once every 3 years, after age 2, if you are within normal weight and without risk factors for diabetes. Testing should be considered at a younger age or be carried out more frequently if you are overweight and have at least 1 risk factor for diabetes.  Breast cancer screening is essential preventive care for women. You should practice "breast self-awareness." This means understanding the normal appearance and feel of your breasts and may include breast self-examination. Any changes detected, no matter how small, should be reported to a health care provider. Women in their 21s and 30s should have a clinical breast exam (CBE) by a health care provider as part of a regular health exam every 1 to 3 years. After age 14, women should have a CBE every year. Starting at age 20, women should consider having a mammogram (breast X-ray test) every year. Women who have a family history of breast cancer should talk to their health care provider about genetic screening. Women at a high risk of breast cancer should talk to their health care providers about having an MRI and a mammogram every year.  Breast cancer gene (BRCA)-related cancer risk assessment is recommended for women who have  family members with BRCA-related cancers. BRCA-related cancers include breast, ovarian, tubal, and peritoneal cancers. Having family members with these cancers may be associated with an increased risk for harmful changes (mutations) in the breast cancer genes BRCA1 and BRCA2. Results of the assessment will determine the need for genetic counseling and BRCA1 and BRCA2 testing.  Routine pelvic exams to screen for cancer are no longer recommended for nonpregnant women who are considered low risk for cancer of the pelvic organs (ovaries, uterus, and vagina) and who do not have symptoms. Ask your health care provider if a screening pelvic exam is right for you.  If you have had past treatment for cervical cancer or a condition that could lead to cancer, you need Pap tests and screening for cancer for at least 20 years after your treatment. If Pap tests have been discontinued, your risk factors (such as having a new sexual partner) need to be reassessed to determine if screening  should be resumed. Some women have medical problems that increase the chance of getting cervical cancer. In these cases, your health care provider may recommend more frequent screening and Pap tests.  The HPV test is an additional test that may be used for cervical cancer screening. The HPV test looks for the virus that can cause the cell changes on the cervix. The cells collected during the Pap test can be tested for HPV. The HPV test could be used to screen women aged 52 years and older, and should be used in women of any age who have unclear Pap test results. After the age of 23, women should have HPV testing at the same frequency as a Pap test.  Colorectal cancer can be detected and often prevented. Most routine colorectal cancer screening begins at the age of 21 years and continues through age 65 years. However, your health care provider may recommend screening at an earlier age if you have risk factors for colon cancer. On a yearly  basis, your health care provider may provide home test kits to check for hidden blood in the stool. Use of a small camera at the end of a tube, to directly examine the colon (sigmoidoscopy or colonoscopy), can detect the earliest forms of colorectal cancer. Talk to your health care provider about this at age 53, when routine screening begins. Direct exam of the colon should be repeated every 5-10 years through age 32 years, unless early forms of pre-cancerous polyps or small growths are found.  People who are at an increased risk for hepatitis B should be screened for this virus. You are considered at high risk for hepatitis B if:  You were born in a country where hepatitis B occurs often. Talk with your health care provider about which countries are considered high risk.  Your parents were born in a high-risk country and you have not received a shot to protect against hepatitis B (hepatitis B vaccine).  You have HIV or AIDS.  You use needles to inject street drugs.  You live with, or have sex with, someone who has hepatitis B.  You get hemodialysis treatment.  You take certain medicines for conditions like cancer, organ transplantation, and autoimmune conditions.  Hepatitis C blood testing is recommended for all people born from [birth year] through 1965 and any individual with known risks for hepatitis C.  Practice safe sex. Use condoms and avoid high-risk sexual practices to reduce the spread of sexually transmitted infections (STIs). STIs include gonorrhea, chlamydia, syphilis, trichomonas, herpes, HPV, and human immunodeficiency virus (HIV). Herpes, HIV, and HPV are viral illnesses that have no cure. They can result in disability, cancer, and death.  You should be screened for sexually transmitted illnesses (STIs) including gonorrhea and chlamydia if:  You are sexually active and are younger than 24 years.  You are older than 24 years and your health care provider tells you that you are at  risk for this type of infection.  Your sexual activity has changed since you were last screened and you are at an increased risk for chlamydia or gonorrhea. Ask your health care provider if you are at risk.  If you are at risk of being infected with HIV, it is recommended that you take a prescription medicine daily to prevent HIV infection. This is called preexposure prophylaxis (PrEP). You are considered at risk if:  You are a heterosexual woman, are sexually active, and are at increased risk for HIV infection.  You take drugs by injection.  You are sexually active with a partner who has HIV.  Talk with your health care provider about whether you are at high risk of being infected with HIV. If you choose to begin PrEP, you should first be tested for HIV. You should then be tested every 3 months for as long as you are taking PrEP.  Osteoporosis is a disease in which the bones lose minerals and strength with aging. This can result in serious bone fractures or breaks. The risk of osteoporosis can be identified using a bone density scan. Women ages 96 years and over and women at risk for fractures or osteoporosis should discuss screening with their health care providers. Ask your health care provider whether you should take a calcium supplement or vitamin D to reduce the rate of osteoporosis.  Menopause can be associated with physical symptoms and risks. Hormone replacement therapy is available to decrease symptoms and risks. You should talk to your health care provider about whether hormone replacement therapy is right for you.  Use sunscreen. Apply sunscreen liberally and repeatedly throughout the day. You should seek shade when your shadow is shorter than you. Protect yourself by wearing long sleeves, pants, a wide-brimmed hat, and sunglasses year round, whenever you are outdoors.  Once a month, do a whole body skin exam, using a mirror to look at the skin on your back. Tell your health care  provider of new moles, moles that have irregular borders, moles that are larger than a pencil eraser, or moles that have changed in shape or color.  Stay current with required vaccines (immunizations).  Influenza vaccine. All adults should be immunized every year.  Tetanus, diphtheria, and acellular pertussis (Td, Tdap) vaccine. Pregnant women should receive 1 dose of Tdap vaccine during each pregnancy. The dose should be obtained regardless of the length of time since the last dose. Immunization is preferred during the 27th-36th week of gestation. An adult who has not previously received Tdap or who does not know her vaccine status should receive 1 dose of Tdap. This initial dose should be followed by tetanus and diphtheria toxoids (Td) booster doses every 10 years. Adults with an unknown or incomplete history of completing a 3-dose immunization series with Td-containing vaccines should begin or complete a primary immunization series including a Tdap dose. Adults should receive a Td booster every 10 years.  Varicella vaccine. An adult without evidence of immunity to varicella should receive 2 doses or a second dose if she has previously received 1 dose. Pregnant females who do not have evidence of immunity should receive the first dose after pregnancy. This first dose should be obtained before leaving the health care facility. The second dose should be obtained 4-8 weeks after the first dose.  Human papillomavirus (HPV) vaccine. Females aged 13-26 years who have not received the vaccine previously should obtain the 3-dose series. The vaccine is not recommended for use in pregnant females. However, pregnancy testing is not needed before receiving a dose. If a female is found to be pregnant after receiving a dose, no treatment is needed. In that case, the remaining doses should be delayed until after the pregnancy. Immunization is recommended for any person with an immunocompromised condition through the  age of 26 years if she did not get any or all doses earlier. During the 3-dose series, the second dose should be obtained 4-8 weeks after the first dose. The third dose should be obtained 24 weeks after the first dose and 16 weeks after the  second dose.  Zoster vaccine. One dose is recommended for adults aged 16 years or older unless certain conditions are present.  Measles, mumps, and rubella (MMR) vaccine. Adults born before 60 generally are considered immune to measles and mumps. Adults born in 2 or later should have 1 or more doses of MMR vaccine unless there is a contraindication to the vaccine or there is laboratory evidence of immunity to each of the three diseases. A routine second dose of MMR vaccine should be obtained at least 28 days after the first dose for students attending postsecondary schools, health care workers, or international travelers. People who received inactivated measles vaccine or an unknown type of measles vaccine during 1963-1967 should receive 2 doses of MMR vaccine. People who received inactivated mumps vaccine or an unknown type of mumps vaccine before 1979 and are at high risk for mumps infection should consider immunization with 2 doses of MMR vaccine. For females of childbearing age, rubella immunity should be determined. If there is no evidence of immunity, females who are not pregnant should be vaccinated. If there is no evidence of immunity, females who are pregnant should delay immunization until after pregnancy. Unvaccinated health care workers born before [birth year] who lack laboratory evidence of measles, mumps, or rubella immunity or laboratory confirmation of disease should consider measles and mumps immunization with 2 doses of MMR vaccine or rubella immunization with 1 dose of MMR vaccine.  Pneumococcal 13-valent conjugate (PCV13) vaccine. When indicated, a person who is uncertain of her immunization history and has no record of immunization should receive the  PCV13 vaccine. An adult aged 27 years or older who has certain medical conditions and has not been previously immunized should receive 1 dose of PCV13 vaccine. This PCV13 should be followed with a dose of pneumococcal polysaccharide (PPSV23) vaccine. The PPSV23 vaccine dose should be obtained at least 8 weeks after the dose of PCV13 vaccine. An adult aged 40 years or older who has certain medical conditions and previously received 1 or more doses of PPSV23 vaccine should receive 1 dose of PCV13. The PCV13 vaccine dose should be obtained 1 or more years after the last PPSV23 vaccine dose.  Pneumococcal polysaccharide (PPSV23) vaccine. When PCV13 is also indicated, PCV13 should be obtained first. All adults aged 34 years and older should be immunized. An adult younger than age 28 years who has certain medical conditions should be immunized. Any person who resides in a nursing home or long-term care facility should be immunized. An adult smoker should be immunized. People with an immunocompromised condition and certain other conditions should receive both PCV13 and PPSV23 vaccines. People with human immunodeficiency virus (HIV) infection should be immunized as soon as possible after diagnosis. Immunization during chemotherapy or radiation therapy should be avoided. Routine use of PPSV23 vaccine is not recommended for American Indians, [place] Natives, or people younger than 65 years unless there are medical conditions that require PPSV23 vaccine. When indicated, people who have unknown immunization and have no record of immunization should receive PPSV23 vaccine. One-time revaccination 5 years after the first dose of PPSV23 is recommended for people aged 19-64 years who have chronic kidney failure, nephrotic syndrome, asplenia, or immunocompromised conditions. People who received 1-2 doses of PPSV23 before age 21 years should receive another dose of PPSV23 vaccine at age 18 years or later if at least 5 years have  passed since the previous dose. Doses of PPSV23 are not needed for people immunized with PPSV23 at or after age 24  years.  Meningococcal vaccine. Adults with asplenia or persistent complement component deficiencies should receive 2 doses of quadrivalent meningococcal conjugate (MenACWY-D) vaccine. The doses should be obtained at least 2 months apart. Microbiologists working with certain meningococcal bacteria, [place] recruits, people at risk during an outbreak, and people who travel to or live in countries with a high rate of meningitis should be immunized. A first-year college student up through age 48 years who is living in a residence hall should receive a dose if she did not receive a dose on or after her 16th birthday. Adults who have certain high-risk conditions should receive one or more doses of vaccine.  Hepatitis A vaccine. Adults who wish to be protected from this disease, have certain high-risk conditions, work with hepatitis A-infected animals, work in hepatitis A research labs, or travel to or work in countries with a high rate of hepatitis A should be immunized. Adults who were previously unvaccinated and who anticipate close contact with an international adoptee during the first 60 days after arrival in the [place] from a country with a high rate of hepatitis A should be immunized.  Hepatitis B vaccine. Adults who wish to be protected from this disease, have certain high-risk conditions, may be exposed to blood or other infectious body fluids, are household contacts or sex partners of hepatitis B positive people, are clients or workers in certain care facilities, or travel to or work in countries with a high rate of hepatitis B should be immunized.  Haemophilus influenzae type b (Hib) vaccine. A previously unvaccinated person with asplenia or sickle cell disease or having a scheduled splenectomy should receive 1 dose of Hib vaccine. Regardless of previous immunization, a recipient of  a hematopoietic stem cell transplant should receive a 3-dose series 6-12 months after her successful transplant. Hib vaccine is not recommended for adults with HIV infection. Preventive Services / Frequency Ages 77 to 76 years  Blood pressure check.** / Every 1 to 2 years.  Lipid and cholesterol check.** / Every 5 years beginning at age 37.  Clinical breast exam.** / Every 3 years for women in their 50s and 102s.  BRCA-related cancer risk assessment.** / For women who have family members with a BRCA-related cancer (breast, ovarian, tubal, or peritoneal cancers).  Pap test.** / Every 2 years from ages 14 through 47. Every 3 years starting at age 47 through age 38 or 98 with a history of 3 consecutive normal Pap tests.  HPV screening.** / Every 3 years from ages 29 through ages 46 to 22 with a history of 3 consecutive normal Pap tests.  Hepatitis C blood test.** / For any individual with known risks for hepatitis C.  Skin self-exam. / Monthly.  Influenza vaccine. / Every year.  Tetanus, diphtheria, and acellular pertussis (Tdap, Td) vaccine.** / Consult your health care provider. Pregnant women should receive 1 dose of Tdap vaccine during each pregnancy. 1 dose of Td every 10 years.  Varicella vaccine.** / Consult your health care provider. Pregnant females who do not have evidence of immunity should receive the first dose after pregnancy.  HPV vaccine. / 3 doses over 6 months, if 62 and younger. The vaccine is not recommended for use in pregnant females. However, pregnancy testing is not needed before receiving a dose.  Measles, mumps, rubella (MMR) vaccine.** / You need at least 1 dose of MMR if you were born in 1957 or later. You may also need a 2nd dose. For females of childbearing age, rubella  immunity should be determined. If there is no evidence of immunity, females who are not pregnant should be vaccinated. If there is no evidence of immunity, females who are pregnant should delay  immunization until after pregnancy.  Pneumococcal 13-valent conjugate (PCV13) vaccine.** / Consult your health care provider.  Pneumococcal polysaccharide (PPSV23) vaccine.** / 1 to 2 doses if you smoke cigarettes or if you have certain conditions.  Meningococcal vaccine.** / 1 dose if you are age 55 to 73 years and a Market researcher living in a residence hall, or have one of several medical conditions, you need to get vaccinated against meningococcal disease. You may also need additional booster doses.  Hepatitis A vaccine.** / Consult your health care provider.  Hepatitis B vaccine.** / Consult your health care provider.  Haemophilus influenzae type b (Hib) vaccine.** / Consult your health care provider. Ages 20 to 28 years  Blood pressure check.** / Every 1 to 2 years.  Lipid and cholesterol check.** / Every 5 years beginning at age 43 years.  Lung cancer screening. / Every year if you are aged 1-80 years and have a 30-pack-year history of smoking and currently smoke or have quit within the past 15 years. Yearly screening is stopped once you have quit smoking for at least 15 years or develop a health problem that would prevent you from having lung cancer treatment.  Clinical breast exam.** / Every year after age 72 years.  BRCA-related cancer risk assessment.** / For women who have family members with a BRCA-related cancer (breast, ovarian, tubal, or peritoneal cancers).  Mammogram.** / Every year beginning at age 31 years and continuing for as long as you are in good health. Consult with your health care provider.  Pap test.** / Every 3 years starting at age 33 years through age 50 or 32 years with a history of 3 consecutive normal Pap tests.  HPV screening.** / Every 3 years from ages 51 years through ages 24 to 58 years with a history of 3 consecutive normal Pap tests.  Fecal occult blood test (FOBT) of stool. / Every year beginning at age 54 years and continuing  until age 60 years. You may not need to do this test if you get a colonoscopy every 10 years.  Flexible sigmoidoscopy or colonoscopy.** / Every 5 years for a flexible sigmoidoscopy or every 10 years for a colonoscopy beginning at age 40 years and continuing until age 69 years.  Hepatitis C blood test.** / For all people born from [birth year] through 1966 and any individual with known risks for hepatitis C.  Skin self-exam. / Monthly.  Influenza vaccine. / Every year.  Tetanus, diphtheria, and acellular pertussis (Tdap/Td) vaccine.** / Consult your health care provider. Pregnant women should receive 1 dose of Tdap vaccine during each pregnancy. 1 dose of Td every 10 years.  Varicella vaccine.** / Consult your health care provider. Pregnant females who do not have evidence of immunity should receive the first dose after pregnancy.  Zoster vaccine.** / 1 dose for adults aged 12 years or older.  Measles, mumps, rubella (MMR) vaccine.** / You need at least 1 dose of MMR if you were born in 1957 or later. You may also need a 2nd dose. For females of childbearing age, rubella immunity should be determined. If there is no evidence of immunity, females who are not pregnant should be vaccinated. If there is no evidence of immunity, females who are pregnant should delay immunization until after pregnancy.  Pneumococcal 13-valent conjugate (  PCV13) vaccine.** / Consult your health care provider.  Pneumococcal polysaccharide (PPSV23) vaccine.** / 1 to 2 doses if you smoke cigarettes or if you have certain conditions.  Meningococcal vaccine.** / Consult your health care provider.  Hepatitis A vaccine.** / Consult your health care provider.  Hepatitis B vaccine.** / Consult your health care provider.  Haemophilus influenzae type b (Hib) vaccine.** / Consult your health care provider. Ages 1 years and over  Blood pressure check.** / Every 1 to 2 years.  Lipid and cholesterol check.** / Every 5 years  beginning at age 77 years.  Lung cancer screening. / Every year if you are aged 73-80 years and have a 30-pack-year history of smoking and currently smoke or have quit within the past 15 years. Yearly screening is stopped once you have quit smoking for at least 15 years or develop a health problem that would prevent you from having lung cancer treatment.  Clinical breast exam.** / Every year after age 98 years.  BRCA-related cancer risk assessment.** / For women who have family members with a BRCA-related cancer (breast, ovarian, tubal, or peritoneal cancers).  Mammogram.** / Every year beginning at age 72 years and continuing for as long as you are in good health. Consult with your health care provider.  Pap test.** / Every 3 years starting at age 34 years through age 36 or 72 years with 3 consecutive normal Pap tests. Testing can be stopped between 65 and 70 years with 3 consecutive normal Pap tests and no abnormal Pap or HPV tests in the past 10 years.  HPV screening.** / Every 3 years from ages 38 years through ages 55 or 62 years with a history of 3 consecutive normal Pap tests. Testing can be stopped between 65 and 70 years with 3 consecutive normal Pap tests and no abnormal Pap or HPV tests in the past 10 years.  Fecal occult blood test (FOBT) of stool. / Every year beginning at age 14 years and continuing until age 27 years. You may not need to do this test if you get a colonoscopy every 10 years.  Flexible sigmoidoscopy or colonoscopy.** / Every 5 years for a flexible sigmoidoscopy or every 10 years for a colonoscopy beginning at age 39 years and continuing until age 67 years.  Hepatitis C blood test.** / For all people born from [birth year] through 1966 and any individual with known risks for hepatitis C.  Osteoporosis screening.** / A one-time screening for women ages 50 years and over and women at risk for fractures or osteoporosis.  Skin self-exam. / Monthly.  Influenza vaccine. /  Every year.  Tetanus, diphtheria, and acellular pertussis (Tdap/Td) vaccine.** / 1 dose of Td every 10 years.  Varicella vaccine.** / Consult your health care provider.  Zoster vaccine.** / 1 dose for adults aged 26 years or older.  Pneumococcal 13-valent conjugate (PCV13) vaccine.** / Consult your health care provider.  Pneumococcal polysaccharide (PPSV23) vaccine.** / 1 dose for all adults aged 51 years and older.  Meningococcal vaccine.** / Consult your health care provider.  Hepatitis A vaccine.** / Consult your health care provider.  Hepatitis B vaccine.** / Consult your health care provider.  Haemophilus influenzae type b (Hib) vaccine.** / Consult your health care provider. ** Family history and personal history of risk and conditions may change your health care provider's recommendations. Document Released: 02/15/2001 Document Revised: 05/06/2013 Document Reviewed: 05/17/2010 [facility] Patient Information 2015 [place], [place]. This information is not intended to replace advice given to you  by your health care provider. Make sure you discuss any questions you have with your health care provider.

## 2013-12-14 LAB — CMP14+EGFR
ALT: 20 IU/L (ref 0–32)
AST: 25 IU/L (ref 0–40)
Albumin/Globulin Ratio: 1.5 (ref 1.1–2.5)
Albumin: 4.3 g/dL (ref 3.6–4.8)
Alkaline Phosphatase: 75 IU/L (ref 39–117)
BILIRUBIN TOTAL: 0.5 mg/dL (ref 0.0–1.2)
BUN/Creatinine Ratio: 20 (ref 11–26)
BUN: 22 mg/dL (ref 8–27)
CALCIUM: 9.3 mg/dL (ref 8.7–10.3)
CHLORIDE: 101 mmol/L (ref 97–108)
CO2: 26 mmol/L (ref 18–29)
Creatinine, Ser: 1.09 mg/dL — ABNORMAL HIGH (ref 0.57–1.00)
GFR calc Af Amer: 60 mL/min/{1.73_m2} (ref >59)
GFR calc non Af Amer: 52 mL/min/{1.73_m2} — ABNORMAL LOW (ref >59)
GLUCOSE: 100 mg/dL — AB (ref 65–99)
Globulin, Total: 2.9 g/dL (ref 1.5–4.5)
POTASSIUM: 4.8 mmol/L (ref 3.5–5.2)
Sodium: 141 mmol/L (ref 134–144)
TOTAL PROTEIN: 7.2 g/dL (ref 6.0–8.5)

## 2013-12-14 LAB — NMR, LIPOPROFILE
Cholesterol: 187 mg/dL (ref 100–199)
HDL CHOLESTEROL BY NMR: 49 mg/dL (ref >39)
HDL Particle Number: 32.4 umol/L (ref >=30.5)
LDL PARTICLE NUMBER: 838 nmol/L (ref <1000)
LDL SIZE: 21.5 nm (ref >20.5)
LDL-C: 101 mg/dL — ABNORMAL HIGH (ref 0–99)
LP-IR Score: 42 (ref <=45)
Small LDL Particle Number: 288 nmol/L (ref <=527)
Triglycerides by NMR: 185 mg/dL — ABNORMAL HIGH (ref 0–149)

## 2013-12-14 LAB — VITAMIN D 25 HYDROXY (VIT D DEFICIENCY, FRACTURES): Vit D, 25-Hydroxy: 24.6 ng/mL — ABNORMAL LOW (ref 30.0–100.0)

## 2013-12-18 ENCOUNTER — Telehealth: Payer: Self-pay | Admitting: Pharmacist

## 2013-12-18 NOTE — Telephone Encounter (Signed)
A1c WNL.  CMP WNL except serum creatinine slightly elevated LDL ok at 101 but Triglycerides slightly elevated - recommend low CHO foods. B12 = WNL Vitamin D low - recommend start vitamin D OTC - 1000 iu daily .  Recheck labs in 3 months  Tried to call - LM to call office

## 2013-12-19 NOTE — Telephone Encounter (Signed)
Patient aware of labs - appt for Dr Sabra Heck for follow up of chronic conditions for march 2016

## 2013-12-30 ENCOUNTER — Ambulatory Visit: Payer: Medicare Other

## 2014-01-29 DIAGNOSIS — L570 Actinic keratosis: Secondary | ICD-10-CM | POA: Diagnosis not present

## 2014-01-29 DIAGNOSIS — L821 Other seborrheic keratosis: Secondary | ICD-10-CM | POA: Diagnosis not present

## 2014-02-05 DIAGNOSIS — M9901 Segmental and somatic dysfunction of cervical region: Secondary | ICD-10-CM | POA: Diagnosis not present

## 2014-02-05 DIAGNOSIS — M9903 Segmental and somatic dysfunction of lumbar region: Secondary | ICD-10-CM | POA: Diagnosis not present

## 2014-02-05 DIAGNOSIS — M9902 Segmental and somatic dysfunction of thoracic region: Secondary | ICD-10-CM | POA: Diagnosis not present

## 2014-02-05 DIAGNOSIS — M5137 Other intervertebral disc degeneration, lumbosacral region: Secondary | ICD-10-CM | POA: Diagnosis not present

## 2014-02-06 DIAGNOSIS — M9903 Segmental and somatic dysfunction of lumbar region: Secondary | ICD-10-CM | POA: Diagnosis not present

## 2014-02-06 DIAGNOSIS — M5137 Other intervertebral disc degeneration, lumbosacral region: Secondary | ICD-10-CM | POA: Diagnosis not present

## 2014-02-06 DIAGNOSIS — M9901 Segmental and somatic dysfunction of cervical region: Secondary | ICD-10-CM | POA: Diagnosis not present

## 2014-02-06 DIAGNOSIS — M9902 Segmental and somatic dysfunction of thoracic region: Secondary | ICD-10-CM | POA: Diagnosis not present

## 2014-02-10 DIAGNOSIS — M9903 Segmental and somatic dysfunction of lumbar region: Secondary | ICD-10-CM | POA: Diagnosis not present

## 2014-02-10 DIAGNOSIS — H11063 Recurrent pterygium of eye, bilateral: Secondary | ICD-10-CM | POA: Diagnosis not present

## 2014-02-10 DIAGNOSIS — H2513 Age-related nuclear cataract, bilateral: Secondary | ICD-10-CM | POA: Diagnosis not present

## 2014-02-10 DIAGNOSIS — M9902 Segmental and somatic dysfunction of thoracic region: Secondary | ICD-10-CM | POA: Diagnosis not present

## 2014-02-10 DIAGNOSIS — M5137 Other intervertebral disc degeneration, lumbosacral region: Secondary | ICD-10-CM | POA: Diagnosis not present

## 2014-02-10 DIAGNOSIS — M9901 Segmental and somatic dysfunction of cervical region: Secondary | ICD-10-CM | POA: Diagnosis not present

## 2014-02-12 DIAGNOSIS — M5137 Other intervertebral disc degeneration, lumbosacral region: Secondary | ICD-10-CM | POA: Diagnosis not present

## 2014-02-12 DIAGNOSIS — M9902 Segmental and somatic dysfunction of thoracic region: Secondary | ICD-10-CM | POA: Diagnosis not present

## 2014-02-12 DIAGNOSIS — M9901 Segmental and somatic dysfunction of cervical region: Secondary | ICD-10-CM | POA: Diagnosis not present

## 2014-02-12 DIAGNOSIS — M9903 Segmental and somatic dysfunction of lumbar region: Secondary | ICD-10-CM | POA: Diagnosis not present

## 2014-02-24 DIAGNOSIS — M9901 Segmental and somatic dysfunction of cervical region: Secondary | ICD-10-CM | POA: Diagnosis not present

## 2014-02-24 DIAGNOSIS — M9902 Segmental and somatic dysfunction of thoracic region: Secondary | ICD-10-CM | POA: Diagnosis not present

## 2014-02-24 DIAGNOSIS — M5137 Other intervertebral disc degeneration, lumbosacral region: Secondary | ICD-10-CM | POA: Diagnosis not present

## 2014-02-24 DIAGNOSIS — M9903 Segmental and somatic dysfunction of lumbar region: Secondary | ICD-10-CM | POA: Diagnosis not present

## 2014-02-27 DIAGNOSIS — M9903 Segmental and somatic dysfunction of lumbar region: Secondary | ICD-10-CM | POA: Diagnosis not present

## 2014-02-27 DIAGNOSIS — M5137 Other intervertebral disc degeneration, lumbosacral region: Secondary | ICD-10-CM | POA: Diagnosis not present

## 2014-02-27 DIAGNOSIS — M9902 Segmental and somatic dysfunction of thoracic region: Secondary | ICD-10-CM | POA: Diagnosis not present

## 2014-02-27 DIAGNOSIS — M9901 Segmental and somatic dysfunction of cervical region: Secondary | ICD-10-CM | POA: Diagnosis not present

## 2014-03-26 ENCOUNTER — Ambulatory Visit: Payer: Medicare Other | Admitting: Family Medicine

## 2014-04-25 ENCOUNTER — Ambulatory Visit: Payer: Medicare Other | Admitting: Family Medicine

## 2014-04-28 ENCOUNTER — Encounter: Payer: Self-pay | Admitting: Physician Assistant

## 2014-04-28 ENCOUNTER — Other Ambulatory Visit: Payer: Self-pay | Admitting: Family Medicine

## 2014-04-28 ENCOUNTER — Ambulatory Visit (INDEPENDENT_AMBULATORY_CARE_PROVIDER_SITE_OTHER): Payer: Medicare Other | Admitting: Physician Assistant

## 2014-04-28 VITALS — BP 135/74 | HR 86 | Temp 97.3°F | Ht 64.5 in | Wt 154.0 lb

## 2014-04-28 DIAGNOSIS — Z0000 Encounter for general adult medical examination without abnormal findings: Secondary | ICD-10-CM

## 2014-04-28 DIAGNOSIS — Z0189 Encounter for other specified special examinations: Secondary | ICD-10-CM

## 2014-04-28 LAB — POCT CBC
GRANULOCYTE PERCENT: 50.2 % (ref 37–80)
HCT, POC: 43.5 % (ref 37.7–47.9)
Hemoglobin: 13.8 g/dL (ref 12.2–16.2)
LYMPH, POC: 2.8 (ref 0.6–3.4)
MCH, POC: 27.8 pg (ref 27–31.2)
MCHC: 31.7 g/dL — AB (ref 31.8–35.4)
MCV: 87.6 fL (ref 80–97)
MPV: 8.8 fL (ref 0–99.8)
POC GRANULOCYTE: 3.5 (ref 2–6.9)
POC LYMPH %: 39.4 % (ref 10–50)
Platelet Count, POC: 230 10*3/uL (ref 142–424)
RBC: 4.96 M/uL (ref 4.04–5.48)
RDW, POC: 14.3 %
WBC: 7 10*3/uL (ref 4.6–10.2)

## 2014-04-28 MED ORDER — FEXOFENADINE-PSEUDOEPHED ER 60-120 MG PO TB12: 1.0000 | ORAL_TABLET | Freq: Every day | ORAL | Status: DC

## 2014-04-28 NOTE — Telephone Encounter (Signed)
done

## 2014-04-28 NOTE — Progress Notes (Signed)
   Subjective:    Patient ID: Stacy Henry, female    DOB: 03-07-1945, 69 y.o.   MRN: [id number]  HPI 69 y/o female presents for f/u of labs for cholesterol screening. She was seen by [facility] rep and told that she needed labs but she is unsure of ones needed.     Review of Systems  All other systems reviewed and are negative.      Objective:   Physical Exam  Constitutional: She is oriented to person, place, and time. She appears well-developed and well-nourished. No distress.  HENT:  Head: Normocephalic and atraumatic.  Pulmonary/Chest: Effort normal and breath sounds normal.  Neurological: She is alert and oriented to person, place, and time.  Skin: She is not diaphoretic.  Psychiatric: She has a normal mood and affect. Her behavior is normal. Judgment and thought content normal.  Nursing note and vitals reviewed.         Assessment & Plan:  1. Encounter for wellness examination  - POCT CBC - Lipid panel - CMP14+EGFR - Thyroid Panel With TSH - Vit D  25 hydroxy (rtn osteoporosis monitoring)   Continue all meds Labs pending Health Maintenance reviewed Diet and exercise encouraged   Khyren Hing A. Benjamin Stain PA-C

## 2014-04-29 ENCOUNTER — Other Ambulatory Visit: Payer: Self-pay | Admitting: Physician Assistant

## 2014-04-29 DIAGNOSIS — E559 Vitamin D deficiency, unspecified: Secondary | ICD-10-CM

## 2014-04-29 LAB — LIPID PANEL
CHOL/HDL RATIO: 3.3 ratio (ref 0.0–4.4)
Cholesterol, Total: 182 mg/dL (ref 100–199)
HDL: 56 mg/dL (ref >39)
LDL Calculated: 97 mg/dL (ref 0–99)
Triglycerides: 144 mg/dL (ref 0–149)
VLDL Cholesterol Cal: 29 mg/dL (ref 5–40)

## 2014-04-29 LAB — CMP14+EGFR
A/G RATIO: 1.6 (ref 1.1–2.5)
ALT: 20 IU/L (ref 0–32)
AST: 26 IU/L (ref 0–40)
Albumin: 4.3 g/dL (ref 3.5–4.8)
Alkaline Phosphatase: 71 IU/L (ref 39–117)
BUN / CREAT RATIO: 17 (ref 11–26)
BUN: 18 mg/dL (ref 8–27)
Bilirubin Total: 0.6 mg/dL (ref 0.0–1.2)
CO2: 26 mmol/L (ref 18–29)
Calcium: 9.5 mg/dL (ref 8.7–10.3)
Chloride: 104 mmol/L (ref 97–108)
Creatinine, Ser: 1.05 mg/dL — ABNORMAL HIGH (ref 0.57–1.00)
GFR calc non Af Amer: 54 mL/min/{1.73_m2} — ABNORMAL LOW (ref >59)
GFR, EST AFRICAN AMERICAN: 62 mL/min/{1.73_m2} (ref >59)
GLOBULIN, TOTAL: 2.7 g/dL (ref 1.5–4.5)
GLUCOSE: 103 mg/dL — AB (ref 65–99)
POTASSIUM: 4.4 mmol/L (ref 3.5–5.2)
Sodium: 144 mmol/L (ref 134–144)
Total Protein: 7 g/dL (ref 6.0–8.5)

## 2014-04-29 LAB — THYROID PANEL WITH TSH
Free Thyroxine Index: 2.2 (ref 1.2–4.9)
T3 UPTAKE RATIO: 27 % (ref 24–39)
T4, Total: 8.3 ug/dL (ref 4.5–12.0)
TSH: 0.533 u[IU]/mL (ref 0.450–4.500)

## 2014-04-29 LAB — VITAMIN D 25 HYDROXY (VIT D DEFICIENCY, FRACTURES): VIT D 25 HYDROXY: 24.8 ng/mL — AB (ref 30.0–100.0)

## 2014-04-29 MED ORDER — CHOLECALCIFEROL 125 MCG (5000 UT) PO TABS: 1.0000 | ORAL_TABLET | Freq: Every day | ORAL | Status: DC

## 2014-04-30 ENCOUNTER — Telehealth: Payer: Self-pay | Admitting: *Deleted

## 2014-04-30 NOTE — Telephone Encounter (Signed)
Pt notified of results Verbalizes understanding 

## 2014-04-30 NOTE — Telephone Encounter (Signed)
-----   Message from Adella Nissen, PA-C sent at 04/29/2014  3:57 PM EDT ----- Labs WNL except Vit D. Prescription sent to pharmacy  Tiffany A. Benjamin Stain PA-C

## 2014-05-19 ENCOUNTER — Telehealth: Payer: Self-pay | Admitting: Internal Medicine

## 2014-05-19 NOTE — Telephone Encounter (Signed)
On recall for tcs

## 2014-05-20 NOTE — Telephone Encounter (Signed)
Letter mailed to pt.  

## 2014-05-21 DIAGNOSIS — L814 Other melanin hyperpigmentation: Secondary | ICD-10-CM | POA: Diagnosis not present

## 2014-05-21 DIAGNOSIS — D225 Melanocytic nevi of trunk: Secondary | ICD-10-CM | POA: Diagnosis not present

## 2014-05-21 DIAGNOSIS — L821 Other seborrheic keratosis: Secondary | ICD-10-CM | POA: Diagnosis not present

## 2014-05-21 DIAGNOSIS — L570 Actinic keratosis: Secondary | ICD-10-CM | POA: Diagnosis not present

## 2014-07-02 DIAGNOSIS — L570 Actinic keratosis: Secondary | ICD-10-CM | POA: Diagnosis not present

## 2014-07-08 ENCOUNTER — Encounter: Payer: Self-pay | Admitting: Family

## 2014-07-08 ENCOUNTER — Encounter (INDEPENDENT_AMBULATORY_CARE_PROVIDER_SITE_OTHER): Payer: Self-pay

## 2014-07-08 ENCOUNTER — Ambulatory Visit (INDEPENDENT_AMBULATORY_CARE_PROVIDER_SITE_OTHER): Payer: Medicare Other | Admitting: Family

## 2014-07-08 VITALS — BP 141/86 | HR 84 | Temp 97.7°F

## 2014-07-08 DIAGNOSIS — R309 Painful micturition, unspecified: Secondary | ICD-10-CM | POA: Diagnosis not present

## 2014-07-08 DIAGNOSIS — B373 Candidiasis of vulva and vagina: Secondary | ICD-10-CM

## 2014-07-08 DIAGNOSIS — B3731 Acute candidiasis of vulva and vagina: Secondary | ICD-10-CM

## 2014-07-08 LAB — POCT URINALYSIS DIPSTICK

## 2014-07-08 LAB — POCT UA - MICROSCOPIC ONLY
BACTERIA, U MICROSCOPIC: NEGATIVE
CRYSTALS, UR, HPF, POC: NEGATIVE
Casts, Ur, LPF, POC: NEGATIVE
EPITHELIAL CELLS, URINE PER MICROSCOPY: NEGATIVE
Mucus, UA: NEGATIVE
Yeast, UA: NEGATIVE

## 2014-07-08 MED ORDER — BUTOCONAZOLE NITRATE (1 DOSE) 2 % VA CREA: TOPICAL_CREAM | VAGINAL | Status: DC

## 2014-07-08 MED ORDER — FLUCONAZOLE 150 MG PO TABS: 150.0000 mg | ORAL_TABLET | ORAL | Status: DC

## 2014-07-08 NOTE — Patient Instructions (Addendum)

## 2014-07-08 NOTE — Progress Notes (Signed)
   Subjective:    Patient ID: Stacy Henry, female    DOB: 11-Jul-1945, 69 y.o.   MRN: [id number]  Vaginal Itching The patient's primary symptoms include genital itching. The patient's pertinent negatives include no genital lesions, genital odor, pelvic pain or vaginal bleeding. This is a recurrent problem. Associated symptoms include chills, dysuria, flank pain, frequency and urgency. Pertinent negatives include no headaches, nausea or vomiting.  Dysuria  This is a new problem. The current episode started in the past 7 days. The problem occurs every urination. The problem has been unchanged. The quality of the pain is described as burning. The pain is at a severity of 8/10. There has been no fever. Associated symptoms include chills, flank pain, frequency and urgency. Pertinent negatives include no discharge, hesitancy, nausea or vomiting. She has tried increased fluids (AZO) for the symptoms. The treatment provided mild relief.      Review of Systems  Constitutional: Positive for chills.  HENT: Negative.   Eyes: Negative.   Respiratory: Negative.  Negative for shortness of breath.   Cardiovascular: Negative.  Negative for palpitations.  Gastrointestinal: Negative.  Negative for nausea and vomiting.  Endocrine: Negative.   Genitourinary: Positive for dysuria, urgency, frequency and flank pain. Negative for hesitancy and pelvic pain.  Musculoskeletal: Negative.   Neurological: Negative.  Negative for headaches.  Hematological: Negative.   Psychiatric/Behavioral: Negative.   All other systems reviewed and are negative.      Objective:   Physical Exam  Constitutional: She is oriented to person, place, and time. She appears well-developed and well-nourished. No distress.  HENT:  Head: Normocephalic and atraumatic.  Eyes: Pupils are equal, round, and reactive to light.  Neck: Normal range of motion. Neck supple. No thyromegaly present.  Cardiovascular: Normal rate, regular  rhythm, normal heart sounds and intact distal pulses.   No murmur heard. Pulmonary/Chest: Effort normal and breath sounds normal. No respiratory distress. She has no wheezes.  Abdominal: Soft. Bowel sounds are normal. She exhibits no distension. There is no tenderness.  Musculoskeletal: Normal range of motion. She exhibits no edema or tenderness.  Negative for CVA tenderness   Neurological: She is alert and oriented to person, place, and time. She has normal reflexes. No cranial nerve deficit.  Skin: Skin is warm and dry.  Psychiatric: She has a normal mood and affect. Her behavior is normal. Judgment and thought content normal.  Vitals reviewed.     BP 141/86 mmHg  Pulse 84  Temp(Src) 97.7 F (36.5 C) (Oral)     Assessment & Plan:  1. Voiding pain - POCT urinalysis dipstick - POCT UA - Microscopic Only  2. Vagina, candidiasis -Keep clean and dry -Cotton underwear -Do bubble baths -RTO prn - fluconazole (DIFLUCAN) 150 MG tablet; Take 1 tablet (150 mg total) by mouth every 3 (three) days.  Dispense: 3 tablet; Refill: 0 - Butoconazole Nitrate, 1 Dose, 2 % CREA; Insert 5 g into the vagina once daily at night for 3 days  Dispense: 15 g; Refill: 0  Evelina Dun, FNP

## 2014-07-09 ENCOUNTER — Telehealth: Payer: Self-pay | Admitting: Family

## 2014-07-09 MED ORDER — TERCONAZOLE 0.8 % VA CREA: 1.0000 | TOPICAL_CREAM | Freq: Every day | VAGINAL | Status: DC

## 2014-07-09 NOTE — Telephone Encounter (Signed)
Prescription sent to pharmacy.

## 2014-08-11 ENCOUNTER — Ambulatory Visit (INDEPENDENT_AMBULATORY_CARE_PROVIDER_SITE_OTHER): Payer: Medicare Other | Admitting: Physician Assistant

## 2014-08-11 ENCOUNTER — Encounter (INDEPENDENT_AMBULATORY_CARE_PROVIDER_SITE_OTHER): Payer: Self-pay

## 2014-08-11 ENCOUNTER — Encounter: Payer: Self-pay | Admitting: Physician Assistant

## 2014-08-11 VITALS — BP 120/79 | HR 85 | Temp 97.4°F | Ht 64.5 in | Wt 154.0 lb

## 2014-08-11 DIAGNOSIS — B373 Candidiasis of vulva and vagina: Secondary | ICD-10-CM | POA: Diagnosis not present

## 2014-08-11 DIAGNOSIS — R300 Dysuria: Secondary | ICD-10-CM | POA: Diagnosis not present

## 2014-08-11 DIAGNOSIS — A499 Bacterial infection, unspecified: Secondary | ICD-10-CM | POA: Diagnosis not present

## 2014-08-11 DIAGNOSIS — N3090 Cystitis, unspecified without hematuria: Secondary | ICD-10-CM

## 2014-08-11 DIAGNOSIS — B3731 Acute candidiasis of vulva and vagina: Secondary | ICD-10-CM

## 2014-08-11 DIAGNOSIS — N760 Acute vaginitis: Secondary | ICD-10-CM

## 2014-08-11 DIAGNOSIS — B9689 Other specified bacterial agents as the cause of diseases classified elsewhere: Secondary | ICD-10-CM

## 2014-08-11 LAB — POCT URINALYSIS DIPSTICK
Bilirubin, UA: NEGATIVE
Glucose, UA: NEGATIVE
KETONES UA: NEGATIVE
Nitrite, UA: NEGATIVE
PROTEIN UA: NEGATIVE
RBC UA: NEGATIVE
Spec Grav, UA: >=1.03
Urobilinogen, UA: NEGATIVE
pH, UA: 5

## 2014-08-11 LAB — POCT WET PREP (WET MOUNT)

## 2014-08-11 LAB — POCT UA - MICROSCOPIC ONLY
Casts, Ur, LPF, POC: NEGATIVE
Crystals, Ur, HPF, POC: NEGATIVE
MUCUS UA: NEGATIVE
RBC, URINE, MICROSCOPIC: NEGATIVE
Yeast, UA: NEGATIVE

## 2014-08-11 MED ORDER — NITROFURANTOIN MONOHYD MACRO 100 MG PO CAPS: 100.0000 mg | ORAL_CAPSULE | Freq: Two times a day (BID) | ORAL | Status: DC

## 2014-08-11 MED ORDER — METRONIDAZOLE 500 MG PO TABS: 500.0000 mg | ORAL_TABLET | Freq: Two times a day (BID) | ORAL | Status: DC

## 2014-08-11 MED ORDER — FLUCONAZOLE 150 MG PO TABS: ORAL_TABLET | ORAL | Status: DC

## 2014-08-11 NOTE — Progress Notes (Signed)
   Subjective:    Patient ID: Stacy Henry, female    DOB: 02-Aug-1945, 69 y.o.   MRN: [id number]  HPI 69 y/o female presents with c/o vaginal itching. She was recently treated for yeast infection with terconazole x 3 days,  after taking antibiotics for UTI. No change in washing detergents or soaps.     Review of Systems  Endocrine: Positive for polyuria.  Genitourinary: Positive for dysuria and frequency. Negative for hematuria and vaginal discharge.       Vaginal itch  Musculoskeletal: Negative for back pain.  Skin: Positive for rash (redness of vagina and labia ).       Objective:   Physical Exam  Abdominal: There is tenderness (suprapubic ).  No cva ttp     Results for orders placed or performed in visit on 08/11/14  POCT UA - Microscopic Only  Result Value Ref Range   WBC, Ur, HPF, POC 80-150    RBC, urine, microscopic NEG    Bacteria, U Microscopic FEW    Mucus, UA NEG    Epithelial cells, urine per micros FEW    Crystals, Ur, HPF, POC NEG    Casts, Ur, LPF, POC NEG    Yeast, UA NEG   POCT urinalysis dipstick  Result Value Ref Range   Color, UA AMBER    Clarity, UA CLEAR    Glucose, UA NEG    Bilirubin, UA NEG    Ketones, UA NEG    Spec Grav, UA >=1.030    Blood, UA NEG    pH, UA 5.0    Protein, UA NEG    Urobilinogen, UA negative    Nitrite, UA NEG    Leukocytes, UA moderate (2+) (A) Negative  POCT Wet Prep ([place])  Result Value Ref Range   Source Wet Prep POC     WBC, Wet Prep HPF POC 1-3    Bacteria Wet Prep HPF POC Moderate (A) None, Few   Clue Cells Wet Prep HPF POC None None   Yeast Wet Prep HPF POC None    KOH Wet Prep POC     Trichomonas Wet Prep HPF POC NONE          Assessment & Plan:  1. Dysuria  - POCT UA - Microscopic Only - POCT urinalysis dipstick - POCT Wet Prep [facility]) - Urine culture - nitrofurantoin, macrocrystal-monohydrate, (MACROBID) 100 MG capsule; Take 1 capsule (100 mg total) by mouth 2 (two) times daily.   Dispense: 20 capsule; Refill: 0  2. Cystitis  - nitrofurantoin, macrocrystal-monohydrate, (MACROBID) 100 MG capsule; Take 1 capsule (100 mg total) by mouth 2 (two) times daily.  Dispense: 20 capsule; Refill: 0  3. BV (bacterial vaginosis)  - metroNIDAZOLE (FLAGYL) 500 MG tablet; Take 1 tablet (500 mg total) by mouth 2 (two) times daily.  Dispense: 14 tablet; Refill: 0  4. Vulvovaginal candidiasis  - fluconazole (DIFLUCAN) 150 MG tablet; Take 1 tablet PO on day 1. Repeat in 3 days  Dispense: 2 tablet; Refill: 0   Continue all meds Labs pending Health Maintenance reviewed Diet and exercise encouraged RTO 2 weeks   Orian Figueira A. Benjamin Stain PA-C

## 2014-08-11 NOTE — Patient Instructions (Signed)

## 2014-08-13 ENCOUNTER — Telehealth: Payer: Self-pay | Admitting: Physician Assistant

## 2014-08-13 ENCOUNTER — Other Ambulatory Visit: Payer: Self-pay | Admitting: Physician Assistant

## 2014-08-13 LAB — URINE CULTURE

## 2014-08-13 MED ORDER — CIPROFLOXACIN HCL 500 MG PO TABS: 500.0000 mg | ORAL_TABLET | Freq: Two times a day (BID) | ORAL | Status: DC

## 2014-08-13 NOTE — Telephone Encounter (Signed)
Patient states that the antibiotics are causing her severe nausea and diarrhea. She states she has tried them with food and milk and it has not helped at all. Please advise

## 2014-08-13 NOTE — Telephone Encounter (Signed)
No answer left VM to call back.

## 2014-08-13 NOTE — Telephone Encounter (Signed)
She needs to stop Macrobid because the bacteria from the culture was only intermittently resistant to it. Prescription sent to pharmacy for Cipro. If nausea continues, I can send over vaginal suppositories for the Metronidazole.   Briunna Leicht A. Benjamin Stain PA-C

## 2014-08-16 ENCOUNTER — Other Ambulatory Visit: Payer: Self-pay | Admitting: Nurse Practitioner

## 2014-08-20 NOTE — Telephone Encounter (Signed)
Patient aware.

## 2014-08-27 DIAGNOSIS — H1045 Other chronic allergic conjunctivitis: Secondary | ICD-10-CM | POA: Diagnosis not present

## 2014-08-27 DIAGNOSIS — H16223 Keratoconjunctivitis sicca, not specified as Sjogren's, bilateral: Secondary | ICD-10-CM | POA: Diagnosis not present

## 2014-10-09 ENCOUNTER — Encounter: Payer: Self-pay | Admitting: Family Medicine

## 2014-10-09 ENCOUNTER — Ambulatory Visit (INDEPENDENT_AMBULATORY_CARE_PROVIDER_SITE_OTHER): Payer: Medicare Other | Admitting: Family Medicine

## 2014-10-09 VITALS — BP 129/70 | HR 91 | Temp 98.3°F | Ht 64.5 in | Wt 154.0 lb

## 2014-10-09 DIAGNOSIS — R197 Diarrhea, unspecified: Secondary | ICD-10-CM | POA: Diagnosis not present

## 2014-10-09 NOTE — Patient Instructions (Signed)
Great to meet you!  The stool studies can take several days to come back.   Be sure to let us know if you have bloody or black sticky stools, develop persistent fever, or cannot tolerate fluids by mouth  We will work on a GI appointment.

## 2014-10-09 NOTE — Progress Notes (Signed)
   HPI  Patient presents today for evaluation of diarrhea and abdominal pain.  Patient explains that her last 2 months she's had diarrhea having at least 3 loose stools per day.  She's been having episodic periods of abdominal pain, chills, subjective fever, racing heart, elevated blood pressure, and emesis that it happened on 2 different occasions. The 2 occasions were October 1 in October 5 and they lasted about 4-6 hours.  She states that the pain is similar to previous episode of ischemic colitis that she had, however she denies hematochezia and now on a this time.  She denies fevers or feeling poorly other than these episodes. She is sore today due to the emesis last night but states that she feels fine otherwise.  PMH: Smoking status noted ROS: Per HPI  Objective: BP 129/70 mmHg  Pulse 91  Temp(Src) 98.3 F (36.8 C) (Oral)  Ht 5' 4.5" (1.638 m)  Wt 154 lb (69.854 kg)  BMI 26.04 kg/m2 Gen: NAD, alert, cooperative with exam HEENT: NCAT, CV: RRR, good S1/S2, no murmur Resp: CTABL, no wheezes, non-labored Abd: Positive bowel sounds, slight tenderness to palpation throughout, no guarding Ext: No edema, warm Neuro: Alert and oriented, No gross deficits  Assessment and plan:  # Diarrhea, episodic abdominal pain Evaluate for CHF and other infectious etiology with stool studies Abdominal exam is reassuring Given 2 month duration and previous episode of ischemic colitis will go ahead and refer to GI. Given her strict return precautions, for now will avoid antibiotics frequent recent courses and reassuring exam with complete resolution of symptoms after her episodes. Good by mouth hydration Basic labs   Orders Placed This Encounter  Procedures  . Clostridium Difficile by PCR    Order Specific Question:  Is your patient experiencing loose or watery stools (3 or more in 24 hours)?    Answer:  Yes    Order Specific Question:  Has the patient received laxatives in the last 24  hours?    Answer:  No    Order Specific Question:  Has a negative Cdiff test resulted in the last 7 days?    Answer:  No  . GI pathogen panel by PCR, stool  . CBC with Differential  . CMP14+EGFR  . Ambulatory referral to Gastroenterology    Referral Priority:  Routine    Referral Type:  Consultation    Referral Reason:  Specialty Services Required    Number of Visits Requested:  Lyle, MD [facility] 10/09/2014, 12:13 PM

## 2014-10-10 ENCOUNTER — Telehealth: Payer: Self-pay | Admitting: Family Medicine

## 2014-10-10 LAB — CBC WITH DIFFERENTIAL/PLATELET
Basophils Absolute: 0 10*3/uL (ref 0.0–0.2)
Basos: 0 %
EOS (ABSOLUTE): 0.3 10*3/uL (ref 0.0–0.4)
EOS: 1 %
HEMATOCRIT: 40.7 % (ref 34.0–46.6)
HEMOGLOBIN: 13.6 g/dL (ref 11.1–15.9)
Immature Grans (Abs): 0 10*3/uL (ref 0.0–0.1)
Immature Granulocytes: 0 %
LYMPHS ABS: 2 10*3/uL (ref 0.7–3.1)
Lymphs: 9 %
MCH: 29.4 pg (ref 26.6–33.0)
MCHC: 33.4 g/dL (ref 31.5–35.7)
MCV: 88 fL (ref 79–97)
MONOCYTES: 5 %
Monocytes Absolute: 1.1 10*3/uL — ABNORMAL HIGH (ref 0.1–0.9)
Neutrophils Absolute: 20.1 10*3/uL — ABNORMAL HIGH (ref 1.4–7.0)
Neutrophils: 85 %
Platelets: 264 10*3/uL (ref 150–379)
RBC: 4.62 x10E6/uL (ref 3.77–5.28)
RDW: 13.8 % (ref 12.3–15.4)
WBC: 23.5 10*3/uL (ref 3.4–10.8)

## 2014-10-10 LAB — CMP14+EGFR
A/G RATIO: 1.3 (ref 1.1–2.5)
ALT: 20 IU/L (ref 0–32)
AST: 20 IU/L (ref 0–40)
Albumin: 4 g/dL (ref 3.5–4.8)
Alkaline Phosphatase: 63 IU/L (ref 39–117)
BUN/Creatinine Ratio: 23 (ref 11–26)
BUN: 22 mg/dL (ref 8–27)
Bilirubin Total: 0.7 mg/dL (ref 0.0–1.2)
CALCIUM: 9.5 mg/dL (ref 8.7–10.3)
CO2: 25 mmol/L (ref 18–29)
Chloride: 100 mmol/L (ref 97–108)
Creatinine, Ser: 0.94 mg/dL (ref 0.57–1.00)
GFR calc non Af Amer: 62 mL/min/{1.73_m2} (ref >59)
GFR, EST AFRICAN AMERICAN: 71 mL/min/{1.73_m2} (ref >59)
Globulin, Total: 3.1 g/dL (ref 1.5–4.5)
Glucose: 87 mg/dL (ref 65–99)
POTASSIUM: 3.8 mmol/L (ref 3.5–5.2)
Sodium: 142 mmol/L (ref 134–144)
TOTAL PROTEIN: 7.1 g/dL (ref 6.0–8.5)

## 2014-10-10 MED ORDER — METRONIDAZOLE 500 MG PO TABS: 500.0000 mg | ORAL_TABLET | Freq: Three times a day (TID) | ORAL | Status: DC

## 2014-10-10 MED ORDER — AMOXICILLIN-POT CLAVULANATE 875-125 MG PO TABS: 1.0000 | ORAL_TABLET | Freq: Two times a day (BID) | ORAL | Status: DC

## 2014-10-10 NOTE — Telephone Encounter (Addendum)
Called to discuss WBC of 23.5, Likely an infectious source for her abd pain.   Left VM to call back.   I will plan to treat for infectious colitis carefully to cover for C diff.   Augmentin 875 BID X 10 days and  Flagyl 500 TID X 10 days.   Laroy Apple, MD [facility] 10/10/2014, 10:44 AM

## 2014-10-10 NOTE — Telephone Encounter (Signed)
Aware ,script sent in.   May need to schedule follow up for more testing if no improvement.

## 2014-10-14 ENCOUNTER — Telehealth: Payer: Self-pay | Admitting: *Deleted

## 2014-10-14 ENCOUNTER — Encounter: Payer: Self-pay | Admitting: Internal Medicine

## 2014-10-14 DIAGNOSIS — R197 Diarrhea, unspecified: Secondary | ICD-10-CM | POA: Diagnosis not present

## 2014-10-14 NOTE — Telephone Encounter (Signed)
-----   Message from Timmothy Euler, MD sent at 10/14/2014  8:01 AM EDT -----  I cant see that she has dropped off her stool studies. These are important, if she is still having abd issues I think it would be smart for her to get seen again.   Do you mind checking on her? Thanks, sam   ----- Message -----    From: SYSTEM    Sent: 10/14/2014  12:05 AM      To: Timmothy Euler, MD

## 2014-10-14 NOTE — Telephone Encounter (Signed)
Attempted to contact patient.  Patient VM is full and can not leave a message

## 2014-10-19 LAB — CLOSTRIDIUM DIFFICILE BY PCR

## 2014-10-19 LAB — GI PATHOGEN PANEL BY PCR, STOOL
C difficile Toxin A/B: NOT DETECTED
CRYPTOSPORIDIUM: NOT DETECTED
Campylobacter: NOT DETECTED
E. coli O157: NOT DETECTED
Enterotoxigenic E coli (ETEC): NOT DETECTED
GIARDIA LAMBLIA: NOT DETECTED
Norovirus GI/GII: NOT DETECTED
ROTAVIRUS A: NOT DETECTED
SALMONELLA: NOT DETECTED
Shiga Toxin-producing E. coli: NOT DETECTED
Shigella: NOT DETECTED

## 2014-11-04 ENCOUNTER — Ambulatory Visit (INDEPENDENT_AMBULATORY_CARE_PROVIDER_SITE_OTHER): Payer: Medicare Other | Admitting: Gastroenterology

## 2014-11-04 ENCOUNTER — Encounter: Payer: Self-pay | Admitting: Gastroenterology

## 2014-11-04 VITALS — BP 130/82 | HR 92 | Temp 97.2°F | Ht 65.0 in | Wt 154.0 lb

## 2014-11-04 DIAGNOSIS — K559 Vascular disorder of intestine, unspecified: Secondary | ICD-10-CM | POA: Insufficient documentation

## 2014-11-04 DIAGNOSIS — R197 Diarrhea, unspecified: Secondary | ICD-10-CM

## 2014-11-04 NOTE — Progress Notes (Signed)
CC'D TO PCP °

## 2014-11-04 NOTE — Assessment & Plan Note (Signed)
69 year old female with recurrent episodes of severe acute onset abdominal pain associated with vomiting, diarrhea often bloody he presents for further evaluation. Most recent episode about 4 weeks ago. Noted to have leukocytosis at that time. No blood per rectum with that episode. Otherwise very similar to prior episodes that have been occurring every couple of years over the past 15 years. Hospitalized back in 2011, records reviewed, endoscopically appear to be consistent with ischemic colitis although I do not have any pathology available and it is not clear that biopsies were done. Clinically she is doing better at this time. Discussed with patient today, prior to her leaving the office, need to review records. Based on review of records, it appears that she did have ischemic colitis in 2011 detailed. Last colonoscopy at that time. I will address further with Dr. Gala Romney. Suspect she will need to have CTA abdomen to evaluate mesenteric vasculature is a next step. Would like to follow-up on leukocytosis as well. Further recommendations to follow.

## 2014-11-04 NOTE — Progress Notes (Signed)
Primary Care Physician: Wardell Honour, MD  Primary Gastroenterologist:  Garfield Cornea, MD   Chief Complaint  Patient presents with  . Diarrhea    HPI: Stacy Henry is a 69 y.o. female here at the request of her PCP for further evaluation of recurrent abdominal pain associated with diarrhea. She was last seen in 2006 at time of screening colonoscopy, which was normal. Patient states that she has had recurrent episodes of severe abdominal pain associated with vomiting and diarrhea, often associated with blood per rectum which is been going on since 1999. Would have episode every couple of years. Actually did well from 2005 2 2012 when she had severe episode resulting in admission at [facility]. She describes having a colonoscopy and was told she had ischemic colitis. Recently had another episode. Describes acute onset severe crampy abdominal pain. Feels better if she's able to vomit or have a bowel movement. Pain usually lasts for several hours but then goes away. Significant fatigue afterwards. Recent episode slightly different and the fact that she did not have any rectal bleeding associated with it.  She notes frequent UTIs over the past one year resulting in multiple courses of antibiotics. Typically chronically has constipation which is managed with dietary measures. With last episode of abdominal pain 4 weeks ago she has had persisting diarrhea 2-3 stools per day although over the last couple of days symptoms have been improving. GI pathogen panel was negative recently. She did have a leukocytosis of 23,000. She was treated empirically with Augmentin and Flagyl per PCP which made her significantly nauseated.  She has chronic GERD, well controlled on pantoprazole. Denies dysphagia.  Reviewed records via care everywhere. CT abdomen and pelvis in 2011 showed long segment wall thickening of the entire descending colon near the junction with the sigmoid colon.  Fatty liver. Stool studies were negative at that time. Colonoscopy dated 08/17/2009 by Dr. Tora Duck showed moderate ischemic colitis around the splenic flexure, ulceration was noted as well as friability. Sharp demarcation from normal tissue to inflamed tissue. I don't see where any pathology was obtained.    Current Outpatient Prescriptions  Medication Sig Dispense Refill  . fexofenadine-pseudoephedrine (ALLEGRA-D) 60-120 MG per tablet Take 1 tablet by mouth daily. 30 tablet 11  . pantoprazole (PROTONIX) 40 MG tablet Take 1 tablet (40 mg total) by mouth daily. 90 tablet 1   No current facility-administered medications for this visit.    Allergies as of 11/04/2014 - Review Complete 11/04/2014  Allergen Reaction Noted  . Sulfa antibiotics  08/06/2012   Past Medical History  Diagnosis Date  . GERD (gastroesophageal reflux disease)   . Osteopenia   . Allergy   . Ischemic colitis ([place]) 2011   Past Surgical History  Procedure Laterality Date  . Appendectomy  as teenager  . Breast surgery Bilateral     "lump"removal  . Colonoscopy  2006    RMR: normal.   Family History  Problem Relation Age of Onset  . Alcohol abuse Sister   . Diabetes Brother   . Heart attack Brother 45  . Diabetes Brother   . Cirrhosis Brother   . Alcohol abuse Brother   . Hypertension Mother   . Atrial fibrillation Mother   . Heart attack Father   . Kidney disease Sister   . Atrial fibrillation Sister   . Colon cancer Neg Hx    Social History   Social History  . Marital Status: Married  Spouse Name: N/A  . Number of Children: N/A  . Years of Education: N/A   Social History Main Topics  . Smoking status: Never Smoker   . Smokeless tobacco: Never Used  . Alcohol Use: Yes     Comment: ocassional wine - less than weekly  . Drug Use: No  . Sexual Activity: Yes   Other Topics Concern  . None   Social History Narrative    ROS:  General: Negative for anorexia, weight loss, fever, chills,  fatigue, weakness. ENT: Negative for hoarseness, difficulty swallowing , nasal congestion. CV: Negative for chest pain, angina, palpitations, dyspnea on exertion, peripheral edema.  Respiratory: Negative for dyspnea at rest, dyspnea on exertion, cough, sputum, wheezing.  GI: See history of present illness. GU:  Negative for dysuria, hematuria, urinary incontinence, urinary frequency, nocturnal urination.  Endo: Negative for unusual weight change.    Physical Examination:   BP 130/82 mmHg  Pulse 92  Temp(Src) 97.2 F (36.2 C) (Oral)  Ht 5\' 5"  (1.651 m)  Wt 154 lb (69.854 kg)  BMI 25.63 kg/m2  General: Well-nourished, well-developed in no acute distress.  Eyes: No icterus. Mouth: Oropharyngeal mucosa moist and pink , no lesions erythema or exudate. Lungs: Clear to auscultation bilaterally.  Heart: Regular rate and rhythm, no murmurs rubs or gallops.  Abdomen: Bowel sounds are normal, nontender, nondistended, no hepatosplenomegaly or masses, no abdominal bruits or hernia , no rebound or guarding.   Extremities: No lower extremity edema. No clubbing or deformities. Neuro: Alert and oriented x 4   Skin: Warm and dry, no jaundice.   Psych: Alert and cooperative, normal mood and affect.  Labs:  GI pathogen panel negative on 10/16 Lab Results  Component Value Date   WBC 23.5* 10/09/2014   HGB 13.8 04/28/2014   HCT 40.7 10/09/2014   MCV 87.6 04/28/2014          Lab Results  Component Value Date   CREATININE 0.94 10/09/2014   BUN 22 10/09/2014   NA 142 10/09/2014   K 3.8 10/09/2014   CL 100 10/09/2014   CO2 25 10/09/2014   Lab Results  Component Value Date   ALT 20 10/09/2014   AST 20 10/09/2014   ALKPHOS 63 10/09/2014   BILITOT 0.7 10/09/2014     Imaging Studies: No results found.

## 2014-11-04 NOTE — Addendum Note (Signed)
Addended by: Mahala Menghini on: 11/04/2014 02:10 PM   Modules accepted: Orders

## 2014-11-04 NOTE — Progress Notes (Signed)
Please let patient know that we do need to recheck her CBC due to elevated WBC count recently. I have reviewed her outside records and waiting on response from Dr. Gala Romney about proceeding with CTA abd.   Please have CBC, creatinine done (in preparation for CTA).

## 2014-11-04 NOTE — Patient Instructions (Signed)
1. We will review your records from [place] [facility] and make further recommendations.

## 2014-11-05 ENCOUNTER — Encounter: Payer: Self-pay | Admitting: Gastroenterology

## 2014-11-05 NOTE — Progress Notes (Signed)
Pt is aware. She will go and have blood work done. [facility] did lab orders.   Ok to schedule tcs. Please schedule.

## 2014-11-05 NOTE — Progress Notes (Signed)
Please let patient know that we need labs as outlined.  Per RMR: He recommends colonoscopy as initial step, since last colonoscopy 5 years ago. Please schedule if patient agreeable.

## 2014-11-06 ENCOUNTER — Other Ambulatory Visit: Payer: Self-pay

## 2014-11-06 DIAGNOSIS — R109 Unspecified abdominal pain: Secondary | ICD-10-CM

## 2014-11-06 DIAGNOSIS — R197 Diarrhea, unspecified: Secondary | ICD-10-CM

## 2014-11-06 MED ORDER — PEG 3350-KCL-NA BICARB-NACL 420 G PO SOLR: 4000.0000 mL | Freq: Once | ORAL | Status: DC

## 2014-11-06 NOTE — Progress Notes (Signed)
Pt is set for 12/03/2014 @ 0900. Instructions mailed to pt. She is aware of date and time

## 2014-11-18 DIAGNOSIS — R197 Diarrhea, unspecified: Secondary | ICD-10-CM | POA: Diagnosis not present

## 2014-11-18 DIAGNOSIS — K559 Vascular disorder of intestine, unspecified: Secondary | ICD-10-CM | POA: Diagnosis not present

## 2014-11-19 LAB — CBC WITH DIFFERENTIAL/PLATELET
Basophils Absolute: 0 10*3/uL (ref 0.0–0.1)
Basophils Relative: 0 % (ref 0–1)
Eosinophils Absolute: 0.2 10*3/uL (ref 0.0–0.7)
Eosinophils Relative: 2 % (ref 0–5)
HEMATOCRIT: 43.6 % (ref 36.0–46.0)
Hemoglobin: 14.2 g/dL (ref 12.0–15.0)
LYMPHS ABS: 3.3 10*3/uL (ref 0.7–4.0)
LYMPHS PCT: 37 % (ref 12–46)
MCH: 28.5 pg (ref 26.0–34.0)
MCHC: 32.6 g/dL (ref 30.0–36.0)
MCV: 87.4 fL (ref 78.0–100.0)
MONO ABS: 0.8 10*3/uL (ref 0.1–1.0)
MONOS PCT: 9 % (ref 3–12)
MPV: 11 fL (ref 8.6–12.4)
NEUTROS ABS: 4.6 10*3/uL (ref 1.7–7.7)
NEUTROS PCT: 52 % (ref 43–77)
Platelets: 275 10*3/uL (ref 150–400)
RBC: 4.99 MIL/uL (ref 3.87–5.11)
RDW: 14.2 % (ref 11.5–15.5)
WBC: 8.9 10*3/uL (ref 4.0–10.5)

## 2014-11-19 LAB — CREATININE, SERUM: Creat: 0.93 mg/dL (ref 0.60–0.93)

## 2014-11-23 NOTE — Progress Notes (Signed)
Quick Note:  Please let patient know her labs are normal. Her WBC now normal. TCS as planned. ______

## 2014-12-03 ENCOUNTER — Encounter (HOSPITAL_COMMUNITY)
Admission: RE | Disposition: A | Discharge status: Home / Self Care | Payer: Self-pay | Source: Ambulatory Visit | Attending: Internal Medicine

## 2014-12-03 ENCOUNTER — Ambulatory Visit (HOSPITAL_COMMUNITY)
Admission: RE | Admit: 2014-12-03 | Discharge: 2014-12-03 | Disposition: A | Discharge status: Home / Self Care | Payer: Medicare Other | Source: Ambulatory Visit | Attending: Internal Medicine | Admitting: Internal Medicine

## 2014-12-03 ENCOUNTER — Encounter (HOSPITAL_COMMUNITY): Payer: Self-pay

## 2014-12-03 DIAGNOSIS — K219 Gastro-esophageal reflux disease without esophagitis: Secondary | ICD-10-CM | POA: Diagnosis not present

## 2014-12-03 DIAGNOSIS — R1084 Generalized abdominal pain: Secondary | ICD-10-CM | POA: Diagnosis not present

## 2014-12-03 DIAGNOSIS — Z8719 Personal history of other diseases of the digestive system: Secondary | ICD-10-CM | POA: Diagnosis not present

## 2014-12-03 DIAGNOSIS — R197 Diarrhea, unspecified: Secondary | ICD-10-CM | POA: Insufficient documentation

## 2014-12-03 DIAGNOSIS — R109 Unspecified abdominal pain: Secondary | ICD-10-CM

## 2014-12-03 DIAGNOSIS — R1110 Vomiting, unspecified: Secondary | ICD-10-CM | POA: Diagnosis not present

## 2014-12-03 DIAGNOSIS — K921 Melena: Secondary | ICD-10-CM | POA: Diagnosis not present

## 2014-12-03 DIAGNOSIS — Z79899 Other long term (current) drug therapy: Secondary | ICD-10-CM | POA: Insufficient documentation

## 2014-12-03 HISTORY — PX: COLONOSCOPY: SHX5424

## 2014-12-03 SURGERY — COLONOSCOPY
Anesthesia: Moderate Sedation

## 2014-12-03 MED ORDER — MEPERIDINE HCL 100 MG/ML IJ SOLN
INTRAMUSCULAR | Status: DC | PRN
  Administered 2014-12-03 (×2): 50 mg via INTRAVENOUS

## 2014-12-03 MED ORDER — MIDAZOLAM HCL 5 MG/5ML IJ SOLN
INTRAMUSCULAR | Status: DC | PRN
  Administered 2014-12-03: 1 mg via INTRAVENOUS
  Administered 2014-12-03 (×2): 2 mg via INTRAVENOUS

## 2014-12-03 MED ORDER — ONDANSETRON HCL 4 MG/2ML IJ SOLN
INTRAMUSCULAR | Status: DC | PRN
  Administered 2014-12-03: 4 mg via INTRAVENOUS

## 2014-12-03 MED ORDER — ONDANSETRON HCL 4 MG/2ML IJ SOLN
INTRAMUSCULAR | Status: AC
  Filled 2014-12-03: qty 2

## 2014-12-03 MED ORDER — MEPERIDINE HCL 100 MG/ML IJ SOLN
INTRAMUSCULAR | Status: AC
  Filled 2014-12-03: qty 2

## 2014-12-03 MED ORDER — SODIUM CHLORIDE 0.9 % IV SOLN
INTRAVENOUS | Status: DC
  Administered 2014-12-03: 08:00:00 via INTRAVENOUS

## 2014-12-03 MED ORDER — STERILE WATER FOR IRRIGATION IR SOLN
Status: DC | PRN
  Administered 2014-12-03: 09:00:00

## 2014-12-03 MED ORDER — MIDAZOLAM HCL 5 MG/5ML IJ SOLN
INTRAMUSCULAR | Status: AC
  Filled 2014-12-03: qty 10

## 2014-12-03 NOTE — H&P (View-Only) (Signed)
    Primary Care Physician: MILLER, STEPHEN M, MD  Primary Gastroenterologist:  Michael Rourk, MD   Chief Complaint  Patient presents with  . Diarrhea    HPI: Stacy Henry is a 69 y.o. female here at the request of her PCP for further evaluation of recurrent abdominal pain associated with diarrhea. She was last seen in 2006 at time of screening colonoscopy, which was normal. Patient states that she has had recurrent episodes of severe abdominal pain associated with vomiting and diarrhea, often associated with blood per rectum which is been going on since 1999. Would have episode every couple of years. Actually did well from 2005 2 2012 when she had severe episode resulting in admission at [facility]. She describes having a colonoscopy and was told she had ischemic colitis. Recently had another episode. Describes acute onset severe crampy abdominal pain. Feels better if she's able to vomit or have a bowel movement. Pain usually lasts for several hours but then goes away. Significant fatigue afterwards. Recent episode slightly different and the fact that she did not have any rectal bleeding associated with it.  She notes frequent UTIs over the past one year resulting in multiple courses of antibiotics. Typically chronically has constipation which is managed with dietary measures. With last episode of abdominal pain 4 weeks ago she has had persisting diarrhea 2-3 stools per day although over the last couple of days symptoms have been improving. GI pathogen panel was negative recently. She did have a leukocytosis of 23,000. She was treated empirically with Augmentin and Flagyl per PCP which made her significantly nauseated.  She has chronic GERD, well controlled on pantoprazole. Denies dysphagia.  Reviewed records via care everywhere. CT abdomen and pelvis in 2011 showed long segment wall thickening of the entire descending colon near the junction with the sigmoid colon.  Fatty liver. Stool studies were negative at that time. Colonoscopy dated 08/17/2009 by Dr. William Bray showed moderate ischemic colitis around the splenic flexure, ulceration was noted as well as friability. Sharp demarcation from normal tissue to inflamed tissue. I don't see where any pathology was obtained.    Current Outpatient Prescriptions  Medication Sig Dispense Refill  . fexofenadine-pseudoephedrine (ALLEGRA-D) 60-120 MG per tablet Take 1 tablet by mouth daily. 30 tablet 11  . pantoprazole (PROTONIX) 40 MG tablet Take 1 tablet (40 mg total) by mouth daily. 90 tablet 1   No current facility-administered medications for this visit.    Allergies as of 11/04/2014 - Review Complete 11/04/2014  Allergen Reaction Noted  . Sulfa antibiotics  08/06/2012   Past Medical History  Diagnosis Date  . GERD (gastroesophageal reflux disease)   . Osteopenia   . Allergy   . Ischemic colitis (HCC) 2011   Past Surgical History  Procedure Laterality Date  . Appendectomy  as teenager  . Breast surgery Bilateral     "lump"removal  . Colonoscopy  2006    RMR: normal.   Family History  Problem Relation Age of Onset  . Alcohol abuse Sister   . Diabetes Brother   . Heart attack Brother 35  . Diabetes Brother   . Cirrhosis Brother   . Alcohol abuse Brother   . Hypertension Mother   . Atrial fibrillation Mother   . Heart attack Father   . Kidney disease Sister   . Atrial fibrillation Sister   . Colon cancer Neg Hx    Social History   Social History  . Marital Status: Married      Spouse Name: N/A  . Number of Children: N/A  . Years of Education: N/A   Social History Main Topics  . Smoking status: Never Smoker   . Smokeless tobacco: Never Used  . Alcohol Use: Yes     Comment: ocassional wine - less than weekly  . Drug Use: No  . Sexual Activity: Yes   Other Topics Concern  . None   Social History Narrative    ROS:  General: Negative for anorexia, weight loss, fever, chills,  fatigue, weakness. ENT: Negative for hoarseness, difficulty swallowing , nasal congestion. CV: Negative for chest pain, angina, palpitations, dyspnea on exertion, peripheral edema.  Respiratory: Negative for dyspnea at rest, dyspnea on exertion, cough, sputum, wheezing.  GI: See history of present illness. GU:  Negative for dysuria, hematuria, urinary incontinence, urinary frequency, nocturnal urination.  Endo: Negative for unusual weight change.    Physical Examination:   BP 130/82 mmHg  Pulse 92  Temp(Src) 97.2 F (36.2 C) (Oral)  Ht 5' 5" (1.651 m)  Wt 154 lb (69.854 kg)  BMI 25.63 kg/m2  General: Well-nourished, well-developed in no acute distress.  Eyes: No icterus. Mouth: Oropharyngeal mucosa moist and pink , no lesions erythema or exudate. Lungs: Clear to auscultation bilaterally.  Heart: Regular rate and rhythm, no murmurs rubs or gallops.  Abdomen: Bowel sounds are normal, nontender, nondistended, no hepatosplenomegaly or masses, no abdominal bruits or hernia , no rebound or guarding.   Extremities: No lower extremity edema. No clubbing or deformities. Neuro: Alert and oriented x 4   Skin: Warm and dry, no jaundice.   Psych: Alert and cooperative, normal mood and affect.  Labs:  GI pathogen panel negative on 10/16 Lab Results  Component Value Date   WBC 23.5* 10/09/2014   HGB 13.8 04/28/2014   HCT 40.7 10/09/2014   MCV 87.6 04/28/2014          Lab Results  Component Value Date   CREATININE 0.94 10/09/2014   BUN 22 10/09/2014   NA 142 10/09/2014   K 3.8 10/09/2014   CL 100 10/09/2014   CO2 25 10/09/2014   Lab Results  Component Value Date   ALT 20 10/09/2014   AST 20 10/09/2014   ALKPHOS 63 10/09/2014   BILITOT 0.7 10/09/2014     Imaging Studies: No results found.      

## 2014-12-03 NOTE — Discharge Instructions (Signed)
°  Colonoscopy Discharge Instructions  Read the instructions outlined below and refer to this sheet in the next few weeks. These discharge instructions provide you with general information on caring for yourself after you leave the hospital. Your doctor may also give you specific instructions. While your treatment has been planned according to the most current medical practices available, unavoidable complications occasionally occur. If you have any problems or questions after discharge, call Dr. Gala Romney at [phone]. ACTIVITY  You may resume your regular activity, but move at a slower pace for the next 24 hours.   Take frequent rest periods for the next 24 hours.   Walking will help get rid of the air and reduce the bloated feeling in your belly (abdomen).   No driving for 24 hours (because of the medicine (anesthesia) used during the test).    Do not sign any important legal documents or operate any machinery for 24 hours (because of the anesthesia used during the test).  NUTRITION  Drink plenty of fluids.   You may resume your normal diet as instructed by your doctor.   Begin with a light meal and progress to your normal diet. Heavy or fried foods are harder to digest and may make you feel sick to your stomach (nauseated).   Avoid alcoholic beverages for 24 hours or as instructed.  MEDICATIONS  You may resume your normal medications unless your doctor tells you otherwise.  WHAT YOU CAN EXPECT TODAY  Some feelings of bloating in the abdomen.   Passage of more gas than usual.   Spotting of blood in your stool or on the toilet paper.  IF YOU HAD POLYPS REMOVED DURING THE COLONOSCOPY:  No aspirin products for 7 days or as instructed.   No alcohol for 7 days or as instructed.   Eat a soft diet for the next 24 hours.  FINDING OUT THE RESULTS OF YOUR TEST Not all test results are available during your visit. If your test results are not back during the visit, make an appointment  with your caregiver to find out the results. Do not assume everything is normal if you have not heard from your caregiver or the medical facility. It is important for you to follow up on all of your test results.  SEEK IMMEDIATE MEDICAL ATTENTION IF:  You have more than a spotting of blood in your stool.   Your belly is swollen (abdominal distention).   You are nauseated or vomiting.   You have a temperature over 101.   You have abdominal pain or discomfort that is severe or gets worse throughout the day.   Maintain adequate hydration daily  Your colonoscopy was normal today. However, I recommend that you have further evaluation of the blood vessels which go to your colon.  I recommend you have a CAT scan angiogram of the abdomen and pelvis in the near future. My office will be happy to schedule when you're ready.

## 2014-12-03 NOTE — Op Note (Signed)
[facility] [address] [place], [address]   COLONOSCOPY PROCEDURE REPORT  PATIENT: Stacy Henry, Stacy Henry  MR#: [id number] BIRTHDATE: 1945/04/24 , 72  yrs. old GENDER: female ENDOSCOPIST: R.  Garfield Cornea, MD FACP [facility] REFERRED [id number] Sabra Heck, M.D. PROCEDURE DATE:  Dec 08, 2014 PROCEDURE:   Colonoscopy, diagnostic INDICATIONS:Recurrent, intermittent bouts of abdominal pain, diarrhea and hematochezia. MEDICATIONS: Versed 5 mg IV and Demerol 100 mg IV in divided doses. Zofran 4 mg IV. ASA CLASS:       Class II  CONSENT: The risks, benefits, alternatives and imponderables including but not limited to bleeding, perforation as well as the possibility of a missed lesion have been reviewed.  The potential for biopsy, lesion removal, etc. have also been discussed. Questions have been answered.  All parties agreeable.  Please see the history and physical in the medical record for more information.  DESCRIPTION OF PROCEDURE:   After the risks benefits and alternatives of the procedure were thoroughly explained, informed consent was obtained.  The digital rectal exam revealed no abnormalities of the rectum.   The [id number]  endoscope was introduced through the anus and advanced to the cecum, which was identified by both the appendix and ileocecal valve. No adverse events experienced.   The quality of the prep was adequate  The instrument was then slowly withdrawn as the colon was fully examined. Estimated blood loss is zero unless otherwise noted in this procedure report.      COLON FINDINGS: Normal-appearing rectum.  Normal-appearing colonic mucosa.  Retroflexion was performed. .  Withdrawal time=7 minutes 0 seconds.  The scope was withdrawn and the procedure completed. COMPLICATIONS: There were no immediate complications.  ENDOSCOPIC IMPRESSION: Normal colonoscopy. Given symptoms and prior history, it is reasonable to conclude the patient has had  recurrent bouts of ischemic or segmental colitis. She needs a vascular study of her mesenteric vasculature.  RECOMMENDATIONS: Maintain adequate daily hydration. Will proceed with a CAT scan angiogram of the abdomen and pelvis. Patient's husband states they're busy over the next 8 weeks and they will call us back to schedule within this interval.  eSigned:  R. Garfield Cornea, MD [facility]al[facility]ss[facility] 12-08-14 10:05 AM   cc:  CPT CODES: ICD CODES:  The ICD and CPT codes recommended by this software are interpretations from the data that the clinical staff has captured with the software.  The verification of the translation of this report to the ICD and CPT codes and modifiers is the sole responsibility of the health care institution and practicing physician where this report was generated.  [employer]. will not be held responsible for the validity of the ICD and CPT codes included on this report.  AMA assumes no liability for data contained or not contained herein. CPT is a Designer, television/film set of the [employer].  PATIENT NAME:  Stacy Henry, Stacy Henry MR#: [id number]

## 2014-12-03 NOTE — Interval H&P Note (Signed)
History and Physical Interval Note:  12/03/2014 9:08 AM  Stacy Henry  has presented today for surgery, with the diagnosis of abdominal pain, diarrhea  The various methods of treatment have been discussed with the patient and family. After consideration of risks, benefits and other options for treatment, the patient has consented to  Procedure(s) with comments: COLONOSCOPY (N/A) - 0900 as a surgical intervention .  The patient's history has been reviewed, patient examined, no change in status, stable for surgery.  I have reviewed the patient's chart and labs.  Questions were answered to the patient's satisfaction.     Stacy Henry  No change. Diagnostic colonoscopy per plan.  The risks, benefits, limitations, alternatives and imponderables have been reviewed with the patient. Questions have been answered. All parties are agreeable.

## 2014-12-05 ENCOUNTER — Encounter (HOSPITAL_COMMUNITY): Payer: Self-pay | Admitting: Internal Medicine

## 2014-12-10 ENCOUNTER — Ambulatory Visit (INDEPENDENT_AMBULATORY_CARE_PROVIDER_SITE_OTHER): Payer: Medicare Other | Admitting: Pharmacist

## 2014-12-10 ENCOUNTER — Encounter: Payer: Self-pay | Admitting: Pharmacist

## 2014-12-10 VITALS — BP 144/88 | HR 64 | Ht 65.0 in | Wt 156.0 lb

## 2014-12-10 DIAGNOSIS — Z0000 Encounter for general adult medical examination without abnormal findings: Secondary | ICD-10-CM | POA: Diagnosis not present

## 2014-12-10 DIAGNOSIS — Z23 Encounter for immunization: Secondary | ICD-10-CM | POA: Diagnosis not present

## 2014-12-10 MED ORDER — FEXOFENADINE HCL 60 MG PO TABS
60.0000 mg | ORAL_TABLET | Freq: Two times a day (BID) | ORAL | Status: DC
Start: 2014-12-10 — End: 2015-01-16

## 2014-12-10 MED ORDER — FLUTICASONE PROPIONATE 50 MCG/ACT NA SUSP: 2.0000 | Freq: Every day | NASAL | Status: DC

## 2014-12-10 NOTE — Progress Notes (Signed)
prevnar 13 and fluarix given pt tolerated well

## 2014-12-10 NOTE — Progress Notes (Signed)
Patient ID: Stacy Henry, female   DOB: October 09, 1945, 69 y.o.   MRN: Stacy [id number]    Subjective:   Stacy Henry is a 69 y.o. white female who presents for a subsequent Medicare Annual Wellness Visit.  Patient is pleasant and cooperative.  She is present with her husband today.  Review of Systems  Review of Systems  Constitutional: Negative.   HENT: Positive for congestion.   Eyes: Negative.   Respiratory: Negative.   Gastrointestinal: Negative.   Genitourinary: Negative.   Musculoskeletal: Negative.   Skin: Negative.   Neurological: Positive for headaches.  Endo/Heme/Allergies: Negative.   Psychiatric/Behavioral: Negative.    Current Medications (verified) Outpatient Encounter Prescriptions as of 12/10/2014  Medication Sig  . BIOTIN PO Take 1 tablet by mouth daily.  . Cyanocobalamin (VITAMIN B-12 PO) Take 1 tablet by mouth daily.  . pantoprazole (PROTONIX) 40 MG tablet Take 1 tablet (40 mg total) by mouth daily.  . [DISCONTINUED] fexofenadine-pseudoephedrine (ALLEGRA-D) 60-120 MG per tablet Take 1 tablet by mouth daily.  . fexofenadine (ALLEGRA ALLERGY) 60 MG tablet Take 1 tablet (60 mg total) by mouth 2 (two) times daily.  . fluticasone (FLONASE) 50 MCG/ACT nasal spray Place 2 sprays into both nostrils daily.  . [DISCONTINUED] polyethylene glycol-electrolytes (NULYTELY/GOLYTELY) 420 G solution Take 4,000 mLs by mouth once. (Patient not taking: Reported on 12/10/2014)   No facility-administered encounter medications on file as of 12/10/2014.    Allergies (verified) Sulfa antibiotics   History: Past Medical History  Diagnosis Date  . GERD (gastroesophageal reflux disease)   . Osteopenia   . Allergy   . Ischemic colitis ([place]) 2011   Past Surgical History  Procedure Laterality Date  . Appendectomy  as teenager  . Breast surgery Bilateral     "lump"removal  . Colonoscopy  2006    RMR: normal.  . Colonoscopy  2012    [facility]: most c/w  ischemic colitis, no path  . Colonoscopy N/A 12/03/2014    Procedure: COLONOSCOPY;  Surgeon: Daneil Dolin, MD;  Location: [facility];  Service: Endoscopy;  Laterality: N/A;  0900  . Eye surgery     Family History  Problem Relation Age of Onset  . Alcohol abuse Sister   . Diabetes Brother   . Heart attack Brother 13  . Diabetes Brother   . Cirrhosis Brother   . Alcohol abuse Brother   . Hypertension Mother   . Atrial fibrillation Mother   . Macular degeneration Mother   . Heart attack Father   . Kidney disease Sister   . Atrial fibrillation Sister   . Colon cancer Neg Hx    Social History   Occupational History  . Not on file.   Social History Main Topics  . Smoking status: Never Smoker   . Smokeless tobacco: Never Used  . Alcohol Use: Yes     Comment: ocassional wine - less than weekly  . Drug Use: No  . Sexual Activity: Yes    Do you feel safe at home?  Yes  Dietary issues and exercise activities: Current Exercise Habits:: The patient has a physically strenous job, but has no regular exercise apart from work.  Current Dietary habits:  Admits to eating "more sweets than I should"  Objective:    Today's Vitals   12/10/14 1209  BP: 144/88  Pulse: 64  Height: 5\' 5"  (1.651 m)  Weight: 156 lb (70.761 kg)  PainSc: 0-No pain   Body mass index is 25.96 kg/(m^2).  Activities of Daily Living In your present state of health, do you have any difficulty performing the following activities: 12/10/2014  Hearing? N  Vision? N  Difficulty concentrating or making decisions? N  Walking or climbing stairs? N  Dressing or bathing? N  Doing errands, shopping? N  Preparing Food and eating ? N  Using the Toilet? N  In the past six months, have you accidently leaked urine? N  Do you have problems with loss of bowel control? N  Managing your Medications? N  Managing your Finances? N  Housekeeping or managing your Housekeeping? N    Are there smokers in your home  (other than you)? No   Cardiac Risk Factors include: advanced age (>35men, >56 women);sedentary lifestyle  Depression Screen PHQ 2/9 Scores 12/10/2014 10/09/2014 07/08/2014 04/28/2014  PHQ - 2 Score 0 3 0 0  PHQ- 9 Score - 8 - -    Fall Risk Fall Risk  12/10/2014 10/09/2014 07/08/2014 04/28/2014 12/13/2013  Falls in the past year? No No No No No    Cognitive Function: No flowsheet data found.  Immunizations and Health Maintenance Immunization History  Administered Date(s) Administered  . Influenza,inj,Quad PF,36+ Mos 10/25/2012, 12/10/2014  . Pneumococcal Conjugate-13 12/10/2014  . Pneumococcal Polysaccharide-23 04/21/2009  . Td 01/03/2009  . Zoster 04/21/2009   Health Maintenance Due  Topic Date Due  . Hepatitis C Screening  Mar 11, 1945    Patient Care Team: Wardell Honour, MD as PCP - General (Family Medicine) Druscilla Brownie, MD as Consulting Physician (Dermatology) Daneil Dolin, MD as Consulting Physician (Gastroenterology)  Indicate any recent Medical Services you may have received from other than [facility] providers in the past year (date may be approximate).    Assessment:    Annual Wellness Visit  HTN - BP was elevated today and patient reports similar when checked a few weeks ago.   Screening Tests Health Maintenance  Topic Date Due  . Hepatitis C Screening  1945-01-25  . MAMMOGRAM  04/13/2015  . INFLUENZA VACCINE  08/04/2015  . TETANUS/TDAP  01/04/2019  . COLONOSCOPY  12/02/2024  . DEXA SCAN  Completed  . ZOSTAVAX  Completed  . PNA vac Low Risk Adult  Completed        Plan:   During the course of the visit Stacy Henry was educated and counseled about the following appropriate screening and preventive services:   Vaccines to include Pneumoccal, Influenza, Hepatitis B, Td, Zostavax - discussed benefit of above vaccinations.  Patient received influenza and prevnar 13 vaccines today in office   Colorectal cancer screening - UTD, patient is currently being  evaluated by GI for abdominal pain  Cardiovascular disease screening - BP was elevated today.  Possibly related to taking allegra D. Lipids at goal   Change Allegra D to Allergra;  Start flonase NS 1-2 sprays in each nostril daily  Diabetes screening - last FBG was WNL  Bone Denisty / Osteoporosis Screening - UTD next due 04/23/2015  Mammogram - UTD (q 2 years)  Glaucoma screening / Diabetic Eye Exam - UTD  Nutrition counseling - discussed limiting fat and red meat in diet  Increase lean sources of protein such as fish, [place] and chicken.  Increase non starchy vegetables.  Limit sugar and processed foods.   Advanced Directives information packet given and reviewed in office today.   Dexa ordered for 04/2015  RTC in 4 weeks to recheck BP  Physical activity - patient will start [place] program 2017.    Patient  Instructions (the written plan) were given to the patient.   Cherre Robins, [facility]   12/10/2014

## 2014-12-10 NOTE — Patient Instructions (Addendum)
Ms. Stacy Henry , Thank you for taking time to come for your Medicare Wellness Visit. I appreciate your ongoing commitment to your health goals. Please review the following plan we discussed and let me know if I can assist you in the future.   These are the goals we discussed:  Continue to eat lots of non-starchy vegetables - carrots, green bean, squash, zucchini, tomatoes, onions, peppers, spinach and other green leafy vegetables, cabbage, lettuce, cucumbers, asparagus, okra (not fried), eggplant limit sugar and processed foods (cakes, cookies, ice cream, crackers and chips) Increase fresh fruit but limit serving sizes 1/2 cup or about the size of tennis or baseball limit red meat to no more than 1-2 times per week (serving size about the size of your palm) Choose whole grains / lean proteins - whole wheat bread, quinoa, whole grain rice (1/2 cup), fish, chicken, [place]  Continue as planned to start Silver Sneakers program in 2017!    This is a list of the screening recommended for you and due dates:  Health Maintenance  Topic Date Due  .  Hepatitis C: One time screening is recommended by Center for Disease Control  (CDC) for  adults born from [birth year] through 1966.   1945-04-12  . Pneumonia vaccines (2 of 2 - PCV13) completed  . Flu Shot  08/2015  . Mammogram  04/13/2015  . Tetanus Vaccine  01/04/2019  . Colon Cancer Screening  12/02/2024  . DEXA scan (bone density measurement)  04/2015  . Shingles Vaccine  Completed  *Topic was postponed. The date shown is not the original due date.    Hypertension Hypertension, commonly called high blood pressure, is when the force of blood pumping through your arteries is too strong. Your arteries are the blood vessels that carry blood from your heart throughout your body. A blood pressure reading consists of a higher number over a lower number, such as 110/72. The higher number (systolic) is the pressure inside your arteries when your heart pumps. The  lower number (diastolic) is the pressure inside your arteries when your heart relaxes. Ideally you want your blood pressure below 120/80. Hypertension forces your heart to work harder to pump blood. Your arteries may become narrow or stiff. Having untreated or uncontrolled hypertension can cause heart attack, stroke, kidney disease, and other problems. RISK FACTORS Some risk factors for high blood pressure are controllable. Others are not.  Risk factors you cannot control include:   Race. You may be at higher risk if you are African American.  Age. Risk increases with age.  Gender. Men are at higher risk than women before age 97 years. After age 91, women are at higher risk than men. Risk factors you can control include:  Not getting enough exercise or physical activity.  Being overweight.  Getting too much fat, sugar, calories, or salt in your diet.  Drinking too much alcohol. SIGNS AND SYMPTOMS Hypertension does not usually cause signs or symptoms. Extremely high blood pressure (hypertensive crisis) may cause headache, anxiety, shortness of breath, and nosebleed. DIAGNOSIS To check if you have hypertension, your health care provider will measure your blood pressure while you are seated, with your arm held at the level of your heart. It should be measured at least twice using the same arm. Certain conditions can cause a difference in blood pressure between your right and left arms. A blood pressure reading that is higher than normal on one occasion does not mean that you need treatment. If it is not clear  whether you have high blood pressure, you may be asked to return on a different day to have your blood pressure checked again. Or, you may be asked to monitor your blood pressure at home for 1 or more weeks. TREATMENT Treating high blood pressure includes making lifestyle changes and possibly taking medicine. Living a healthy lifestyle can help lower high blood pressure. You may need to  change some of your habits. Lifestyle changes may include:  Following the DASH diet. This diet is high in fruits, vegetables, and whole grains. It is low in salt, red meat, and added sugars.  Keep your sodium intake below 2,300 mg per day.  Getting at least 30-45 minutes of aerobic exercise at least 4 times per week.  Losing weight if necessary.  Not smoking.  Limiting alcoholic beverages.  Learning ways to reduce stress. Your health care provider may prescribe medicine if lifestyle changes are not enough to get your blood pressure under control, and if one of the following is true:  You are 21-78 years of age and your systolic blood pressure is above 140.  You are 65 years of age or older, and your systolic blood pressure is above 150.  Your diastolic blood pressure is above 90.  You have diabetes, and your systolic blood pressure is over [id number] or your diastolic blood pressure is over 90.  You have kidney disease and your blood pressure is above 140/90.  You have heart disease and your blood pressure is above 140/90. Your personal target blood pressure may vary depending on your medical conditions, your age, and other factors. HOME CARE INSTRUCTIONS  Have your blood pressure rechecked as directed by your health care provider.   Take medicines only as directed by your health care provider. Follow the directions carefully. Blood pressure medicines must be taken as prescribed. The medicine does not work as well when you skip doses. Skipping doses also puts you at risk for problems.  Do not smoke.   Monitor your blood pressure at home as directed by your health care provider. SEEK MEDICAL CARE IF:   You think you are having a reaction to medicines taken.  You have recurrent headaches or feel dizzy.  You have swelling in your ankles.  You have trouble with your vision. SEEK IMMEDIATE MEDICAL CARE IF:  You develop a severe headache or confusion.  You have unusual  weakness, numbness, or feel faint.  You have severe chest or abdominal pain.  You vomit repeatedly.  You have trouble breathing. MAKE SURE YOU:   Understand these instructions.  Will watch your condition.  Will get help right away if you are not doing well or get worse.   This information is not intended to replace advice given to you by your health care provider. Make sure you discuss any questions you have with your health care provider.   Document Released: 12/20/2004 Document Revised: 05/06/2014 Document Reviewed: 10/12/2012 Elsevier Interactive Patient Education [employer].

## 2014-12-15 ENCOUNTER — Ambulatory Visit: Payer: Medicare Other | Admitting: Pharmacist

## 2015-01-12 ENCOUNTER — Ambulatory Visit: Payer: Self-pay | Admitting: Pharmacist

## 2015-01-16 ENCOUNTER — Ambulatory Visit (INDEPENDENT_AMBULATORY_CARE_PROVIDER_SITE_OTHER): Payer: Medicare Other | Admitting: Pharmacist

## 2015-01-16 ENCOUNTER — Encounter: Payer: Self-pay | Admitting: Pharmacist

## 2015-01-16 VITALS — BP 132/82 | HR 77

## 2015-01-16 DIAGNOSIS — K219 Gastro-esophageal reflux disease without esophagitis: Secondary | ICD-10-CM | POA: Diagnosis not present

## 2015-01-16 DIAGNOSIS — Z79899 Other long term (current) drug therapy: Secondary | ICD-10-CM | POA: Diagnosis not present

## 2015-01-16 DIAGNOSIS — R030 Elevated blood-pressure reading, without diagnosis of hypertension: Secondary | ICD-10-CM | POA: Diagnosis not present

## 2015-01-16 DIAGNOSIS — IMO0001 Reserved for inherently not codable concepts without codable children: Secondary | ICD-10-CM

## 2015-01-16 NOTE — Progress Notes (Signed)
Subjective:    Patient here for follow-up of elevated blood pressure.  She is exercising and is adherent to a low-salt diet.  Blood pressure was noted to be elevated at her Annual Wellness visit 12/10/2014.  She was taking allegra D daily at that times.  Advised to switch to Allegra without decongestant.  She tried but states that she had more congestion however she is only taking Allegra D every other day.  Cardiac symptoms: none.  Cardiovascular risk factors: advanced age (older than 83 for men, 23 for women), family history of premature cardiovascular disease and hypertensionUse of agents associated with hypertension: decongestants. History of target organ damage: none.  Patient also asks about diet recommendations to decrease GERD as she would like to discontinue pantoprazole in future if possible.   The following portions of the patient's history were reviewed and updated as appropriate: allergies, current medications, past family history, past medical history, past social history, past surgical history and problem list.  Review of Systems Review of Systems  Constitutional: Negative.   HENT: Negative.   Respiratory: Negative.   Cardiovascular: Negative.   Gastrointestinal: Positive for heartburn. Negative for nausea, vomiting, abdominal pain, diarrhea, constipation, blood in stool and melena.  Skin: Negative.      Objective:    Filed Vitals:   01/16/15 1050  BP: 132/82  Pulse: 77    Assessment:    Hypertension, normal blood pressure noted today.  GERD / medication management  Plan:    Dietary sodium restriction. Regular aerobic exercise. Check blood pressures once or twice per week and record. Discussed limiting high fat, high acid foods to help with GERD (list provided to patient) Also recommended that she try taking pantoprazole every other day for 2 weeks and then gradually expanding number of days between doses until she feels she can discontinue.  She can use either  pepcid AC or tums as needed for GERD (no more than 3 per day though) Follow up: 6 months and as needed.    Cherre Robins, PharmD, CPP

## 2015-01-16 NOTE — Patient Instructions (Signed)
When trying to taper pantoprazole / Protonix - start with taking every other day for 2 weeks and then try to lengthen to every 3 to 4 days as able.  For breakthrough acid reflux you can try either Pepcid AC or Tum as needed  Food Choices for Gastroesophageal Reflux Disease, Adult When you have gastroesophageal reflux disease (GERD), the foods you eat and your eating habits are very important. Choosing the right foods can help ease the discomfort of GERD. WHAT GENERAL GUIDELINES DO I NEED TO FOLLOW?  Choose fruits, vegetables, whole grains, low-fat dairy products, and low-fat meat, fish, and poultry.  Limit fats such as oils, salad dressings, butter, nuts, and avocado.  Keep a food diary to identify foods that cause symptoms.  Avoid foods that cause reflux. These may be different for different people.  Eat frequent small meals instead of three large meals each day.  Eat your meals slowly, in a relaxed setting.  Limit fried foods.  Cook foods using methods other than frying.  Avoid drinking alcohol.  Avoid drinking large amounts of liquids with your meals.  Avoid bending over or lying down until 2-3 hours after eating. WHAT FOODS ARE NOT RECOMMENDED? The following are some foods and drinks that may worsen your symptoms: Vegetables Tomatoes. Tomato juice. Tomato and spaghetti sauce. Chili peppers. Onion and garlic. Horseradish. Fruits Oranges, grapefruit, and lemon (fruit and juice). Meats High-fat meats, fish, and poultry. This includes hot dogs, ribs, ham, sausage, salami, and bacon. Dairy Whole milk and chocolate milk. Sour cream. Cream. Butter. Ice cream. Cream cheese.  Beverages Coffee and tea, with or without caffeine. Carbonated beverages or energy drinks. Condiments Hot sauce. Barbecue sauce.  Sweets/Desserts Chocolate and cocoa. Donuts. Peppermint and spearmint. Fats and Oils High-fat foods, including [place] fries and potato chips. Other Vinegar. Strong spices,  such as black pepper, white pepper, red pepper, cayenne, curry powder, cloves, ginger, and chili powder. The items listed above may not be a complete list of foods and beverages to avoid. Contact your dietitian for more information.   This information is not intended to replace advice given to you by your health care provider. Make sure you discuss any questions you have with your health care provider.   Document Released: 12/20/2004 Document Revised: 01/10/2014 Document Reviewed: 10/24/2012 Elsevier Interactive Patient Education [employer].

## 2015-02-26 ENCOUNTER — Ambulatory Visit (INDEPENDENT_AMBULATORY_CARE_PROVIDER_SITE_OTHER): Payer: Medicare Other | Admitting: Family Medicine

## 2015-02-26 ENCOUNTER — Encounter: Payer: Self-pay | Admitting: Family Medicine

## 2015-02-26 VITALS — BP 140/89 | HR 99 | Temp 97.9°F | Ht 65.0 in | Wt 156.2 lb

## 2015-02-26 DIAGNOSIS — R300 Dysuria: Secondary | ICD-10-CM | POA: Diagnosis not present

## 2015-02-26 DIAGNOSIS — N3000 Acute cystitis without hematuria: Secondary | ICD-10-CM

## 2015-02-26 DIAGNOSIS — J209 Acute bronchitis, unspecified: Secondary | ICD-10-CM

## 2015-02-26 LAB — POCT URINALYSIS DIPSTICK
BILIRUBIN UA: NEGATIVE
GLUCOSE UA: NEGATIVE
KETONES UA: NEGATIVE
Nitrite, UA: NEGATIVE
Protein, UA: NEGATIVE
RBC UA: NEGATIVE
Urobilinogen, UA: NEGATIVE
pH, UA: 6

## 2015-02-26 LAB — POCT UA - MICROSCOPIC ONLY
CRYSTALS, UR, HPF, POC: NEGATIVE
Casts, Ur, LPF, POC: NEGATIVE
YEAST UA: NEGATIVE

## 2015-02-26 MED ORDER — CEFDINIR 300 MG PO CAPS: 300.0000 mg | ORAL_CAPSULE | Freq: Two times a day (BID) | ORAL | Status: DC

## 2015-02-26 MED ORDER — BENZONATATE 100 MG PO CAPS: 100.0000 mg | ORAL_CAPSULE | Freq: Two times a day (BID) | ORAL | Status: DC | PRN

## 2015-02-26 NOTE — Progress Notes (Signed)
   HPI  Patient presents today efer to visit.  Patient She's had cough, chest tightness, malaise, and fever the last 9 days. Initially her symptoms began and then she had subjective fever with malaise for 3-4 days. The alaisehas resolved but she continues to have coughing. She's tolerating food and fluid easily. She's been using over-the-counter cough and cold medicines. She denies any fever, chills, body aches  She's also had dysuria for 3 days No abdominal pain, fever or chills during this time, or food or fluid intolerance.  PMH: Smoking status noted ROS: Per HPI  Objective: BP 140/89 mmHg  Pulse 99  Temp(Src) 97.9 F (36.6 C) (Oral)  Ht 5\' 5"  (1.651 m)  Wt 156 lb 3.2 oz (70.852 kg)  BMI 25.99 kg/m2 Gen: NAD, alert, cooperative with exam HEENT: NCAT, TMs normal, oropharynx clear with swollen adenoids CV: RRR, good S1/S2, no murmur Resp: CTABL, no wheezes, non-labored Ext: No edema, warm Neuro: Alert and oriented, No gross deficits  Assessment and plan:  # UTI Treat with Omnicef considering cough and cold, this would cover pneumonia as well. Culture Supportive care discussed  # Cough Seems to be improving, likely viral URI consistent, , however considering time course and the fact that I'm treating with antibiotics for UTII will go ahead and cover for CAP   Orders Placed This Encounter  Procedures  . POCT urinalysis dipstick  . POCT UA - Microscopic Only    Meds ordered this encounter  Medications  . cefdinir (OMNICEF) 300 MG capsule    Sig: Take 1 capsule (300 mg total) by mouth 2 (two) times daily. 1 po BID    Dispense:  20 capsule    Refill:  0  . benzonatate (TESSALON) 100 MG capsule    Sig: Take 1 capsule (100 mg total) by mouth 2 (two) times daily as needed for cough.    Dispense:  20 capsule    Refill:  Rosman, MD [facility] 02/26/2015, 4:09 PM

## 2015-02-26 NOTE — Addendum Note (Signed)
Addended by: Wyline Mood on: 02/26/2015 04:41 PM   Modules accepted: Orders, SmartSet

## 2015-02-26 NOTE — Patient Instructions (Signed)
Great to see you!   Urinary Tract Infection Urinary tract infections (UTIs) can develop anywhere along your urinary tract. Your urinary tract is your body's drainage system for removing wastes and extra water. Your urinary tract includes two kidneys, two ureters, a bladder, and a urethra. Your kidneys are a pair of bean-shaped organs. Each kidney is about the size of your fist. They are located below your ribs, one on each side of your spine. CAUSES Infections are caused by microbes, which are microscopic organisms, including fungi, viruses, and bacteria. These organisms are so small that they can only be seen through a microscope. Bacteria are the microbes that most commonly cause UTIs. SYMPTOMS  Symptoms of UTIs may vary by age and gender of the patient and by the location of the infection. Symptoms in young women typically include a frequent and intense urge to urinate and a painful, burning feeling in the bladder or urethra during urination. Older women and men are more likely to be tired, shaky, and weak and have muscle aches and abdominal pain. A fever may mean the infection is in your kidneys. Other symptoms of a kidney infection include pain in your back or sides below the ribs, nausea, and vomiting. DIAGNOSIS To diagnose a UTI, your caregiver will ask you about your symptoms. Your caregiver will also ask you to provide a urine sample. The urine sample will be tested for bacteria and white blood cells. White blood cells are made by your body to help fight infection. TREATMENT  Typically, UTIs can be treated with medication. Because most UTIs are caused by a bacterial infection, they usually can be treated with the use of antibiotics. The choice of antibiotic and length of treatment depend on your symptoms and the type of bacteria causing your infection. HOME CARE INSTRUCTIONS  If you were prescribed antibiotics, take them exactly as your caregiver instructs you. Finish the medication even if  you feel better after you have only taken some of the medication.  Drink enough water and fluids to keep your urine clear or pale yellow.  Avoid caffeine, tea, and carbonated beverages. They tend to irritate your bladder.  Empty your bladder often. Avoid holding urine for long periods of time.  Empty your bladder before and after sexual intercourse.  After a bowel movement, women should cleanse from front to back. Use each tissue only once. SEEK MEDICAL CARE IF:   You have back pain.  You develop a fever.  Your symptoms do not begin to resolve within 3 days. SEEK IMMEDIATE MEDICAL CARE IF:   You have severe back pain or lower abdominal pain.  You develop chills.  You have nausea or vomiting.  You have continued burning or discomfort with urination. MAKE SURE YOU:   Understand these instructions.  Will watch your condition.  Will get help right away if you are not doing well or get worse.   This information is not intended to replace advice given to you by your health care provider. Make sure you discuss any questions you have with your health care provider.   Document Released: 09/29/2004 Document Revised: 09/10/2014 Document Reviewed: 01/28/2011 Elsevier Interactive Patient Education 2016 Elsevier Inc.  

## 2015-03-02 LAB — URINE CULTURE

## 2015-04-21 ENCOUNTER — Telehealth: Payer: Self-pay | Admitting: Internal Medicine

## 2015-04-21 ENCOUNTER — Other Ambulatory Visit: Payer: Self-pay

## 2015-04-21 DIAGNOSIS — R197 Diarrhea, unspecified: Secondary | ICD-10-CM

## 2015-04-21 DIAGNOSIS — R109 Unspecified abdominal pain: Secondary | ICD-10-CM

## 2015-04-21 DIAGNOSIS — K559 Vascular disorder of intestine, unspecified: Secondary | ICD-10-CM

## 2015-04-21 NOTE — Telephone Encounter (Signed)
PATIENT LAST TCS REPORT STATES THAT SHE NEEDS A CT SCAN   PLEASE CALL HER TO SCHEDULE [phone]

## 2015-04-21 NOTE — Telephone Encounter (Signed)
Patient is set for CTA on 04/24/2015 @ 0915. Pt is aware to be fasting. Per [facility] online no PA is required.

## 2015-04-24 ENCOUNTER — Ambulatory Visit (HOSPITAL_COMMUNITY)
Admission: RE | Admit: 2015-04-24 | Discharge: 2015-04-24 | Disposition: A | Discharge status: Home / Self Care | Payer: Medicare Other | Source: Ambulatory Visit | Attending: Internal Medicine | Admitting: Internal Medicine

## 2015-04-24 DIAGNOSIS — R109 Unspecified abdominal pain: Secondary | ICD-10-CM

## 2015-04-24 DIAGNOSIS — R197 Diarrhea, unspecified: Secondary | ICD-10-CM | POA: Diagnosis not present

## 2015-04-24 DIAGNOSIS — K559 Vascular disorder of intestine, unspecified: Secondary | ICD-10-CM | POA: Diagnosis not present

## 2015-04-24 LAB — POCT I-STAT CREATININE: Creatinine, Ser: 1 mg/dL (ref 0.44–1.00)

## 2015-04-24 MED ORDER — IOPAMIDOL (ISOVUE-300) INJECTION 61%
100.0000 mL | Freq: Once | INTRAVENOUS | Status: AC | PRN
  Administered 2015-04-24: 100 mL via INTRAVENOUS

## 2015-07-13 DIAGNOSIS — M9903 Segmental and somatic dysfunction of lumbar region: Secondary | ICD-10-CM | POA: Diagnosis not present

## 2015-07-13 DIAGNOSIS — M5137 Other intervertebral disc degeneration, lumbosacral region: Secondary | ICD-10-CM | POA: Diagnosis not present

## 2015-07-13 DIAGNOSIS — M9902 Segmental and somatic dysfunction of thoracic region: Secondary | ICD-10-CM | POA: Diagnosis not present

## 2015-07-13 DIAGNOSIS — M9901 Segmental and somatic dysfunction of cervical region: Secondary | ICD-10-CM | POA: Diagnosis not present

## 2015-07-15 DIAGNOSIS — M9902 Segmental and somatic dysfunction of thoracic region: Secondary | ICD-10-CM | POA: Diagnosis not present

## 2015-07-15 DIAGNOSIS — M9903 Segmental and somatic dysfunction of lumbar region: Secondary | ICD-10-CM | POA: Diagnosis not present

## 2015-07-15 DIAGNOSIS — M9901 Segmental and somatic dysfunction of cervical region: Secondary | ICD-10-CM | POA: Diagnosis not present

## 2015-07-15 DIAGNOSIS — M5137 Other intervertebral disc degeneration, lumbosacral region: Secondary | ICD-10-CM | POA: Diagnosis not present

## 2015-07-16 DIAGNOSIS — M9902 Segmental and somatic dysfunction of thoracic region: Secondary | ICD-10-CM | POA: Diagnosis not present

## 2015-07-16 DIAGNOSIS — M5137 Other intervertebral disc degeneration, lumbosacral region: Secondary | ICD-10-CM | POA: Diagnosis not present

## 2015-07-16 DIAGNOSIS — M9903 Segmental and somatic dysfunction of lumbar region: Secondary | ICD-10-CM | POA: Diagnosis not present

## 2015-07-16 DIAGNOSIS — M9901 Segmental and somatic dysfunction of cervical region: Secondary | ICD-10-CM | POA: Diagnosis not present

## 2015-07-20 DIAGNOSIS — M9901 Segmental and somatic dysfunction of cervical region: Secondary | ICD-10-CM | POA: Diagnosis not present

## 2015-07-20 DIAGNOSIS — M9902 Segmental and somatic dysfunction of thoracic region: Secondary | ICD-10-CM | POA: Diagnosis not present

## 2015-07-20 DIAGNOSIS — M9903 Segmental and somatic dysfunction of lumbar region: Secondary | ICD-10-CM | POA: Diagnosis not present

## 2015-07-20 DIAGNOSIS — M5137 Other intervertebral disc degeneration, lumbosacral region: Secondary | ICD-10-CM | POA: Diagnosis not present

## 2015-07-21 ENCOUNTER — Telehealth: Payer: Self-pay | Admitting: Family Medicine

## 2015-07-21 NOTE — Telephone Encounter (Signed)
Order will be placed.

## 2015-07-22 ENCOUNTER — Other Ambulatory Visit: Payer: Self-pay | Admitting: *Deleted

## 2015-07-22 DIAGNOSIS — M9901 Segmental and somatic dysfunction of cervical region: Secondary | ICD-10-CM | POA: Diagnosis not present

## 2015-07-22 DIAGNOSIS — M5137 Other intervertebral disc degeneration, lumbosacral region: Secondary | ICD-10-CM | POA: Diagnosis not present

## 2015-07-22 DIAGNOSIS — M9902 Segmental and somatic dysfunction of thoracic region: Secondary | ICD-10-CM | POA: Diagnosis not present

## 2015-07-22 DIAGNOSIS — M9903 Segmental and somatic dysfunction of lumbar region: Secondary | ICD-10-CM | POA: Diagnosis not present

## 2015-07-22 DIAGNOSIS — Z1231 Encounter for screening mammogram for malignant neoplasm of breast: Secondary | ICD-10-CM

## 2015-07-23 ENCOUNTER — Other Ambulatory Visit: Payer: Self-pay | Admitting: Physician Assistant

## 2015-07-27 DIAGNOSIS — M9903 Segmental and somatic dysfunction of lumbar region: Secondary | ICD-10-CM | POA: Diagnosis not present

## 2015-07-27 DIAGNOSIS — M5137 Other intervertebral disc degeneration, lumbosacral region: Secondary | ICD-10-CM | POA: Diagnosis not present

## 2015-07-27 DIAGNOSIS — M9901 Segmental and somatic dysfunction of cervical region: Secondary | ICD-10-CM | POA: Diagnosis not present

## 2015-07-27 DIAGNOSIS — M9902 Segmental and somatic dysfunction of thoracic region: Secondary | ICD-10-CM | POA: Diagnosis not present

## 2015-07-29 DIAGNOSIS — M5137 Other intervertebral disc degeneration, lumbosacral region: Secondary | ICD-10-CM | POA: Diagnosis not present

## 2015-07-29 DIAGNOSIS — M9901 Segmental and somatic dysfunction of cervical region: Secondary | ICD-10-CM | POA: Diagnosis not present

## 2015-07-29 DIAGNOSIS — M9902 Segmental and somatic dysfunction of thoracic region: Secondary | ICD-10-CM | POA: Diagnosis not present

## 2015-07-29 DIAGNOSIS — M9903 Segmental and somatic dysfunction of lumbar region: Secondary | ICD-10-CM | POA: Diagnosis not present

## 2015-07-31 ENCOUNTER — Other Ambulatory Visit: Payer: Self-pay | Admitting: Physician Assistant

## 2015-08-03 DIAGNOSIS — M9901 Segmental and somatic dysfunction of cervical region: Secondary | ICD-10-CM | POA: Diagnosis not present

## 2015-08-03 DIAGNOSIS — M5137 Other intervertebral disc degeneration, lumbosacral region: Secondary | ICD-10-CM | POA: Diagnosis not present

## 2015-08-03 DIAGNOSIS — M9902 Segmental and somatic dysfunction of thoracic region: Secondary | ICD-10-CM | POA: Diagnosis not present

## 2015-08-03 DIAGNOSIS — M9903 Segmental and somatic dysfunction of lumbar region: Secondary | ICD-10-CM | POA: Diagnosis not present

## 2015-08-05 DIAGNOSIS — M9903 Segmental and somatic dysfunction of lumbar region: Secondary | ICD-10-CM | POA: Diagnosis not present

## 2015-08-05 DIAGNOSIS — M5137 Other intervertebral disc degeneration, lumbosacral region: Secondary | ICD-10-CM | POA: Diagnosis not present

## 2015-08-05 DIAGNOSIS — M9902 Segmental and somatic dysfunction of thoracic region: Secondary | ICD-10-CM | POA: Diagnosis not present

## 2015-08-05 DIAGNOSIS — M9901 Segmental and somatic dysfunction of cervical region: Secondary | ICD-10-CM | POA: Diagnosis not present

## 2015-08-12 ENCOUNTER — Ambulatory Visit: Payer: Medicare Other | Admitting: Obstetrics

## 2015-08-20 ENCOUNTER — Telehealth: Payer: Self-pay

## 2015-08-31 NOTE — Telephone Encounter (Signed)
x

## 2015-09-10 ENCOUNTER — Ambulatory Visit
Admission: RE | Admit: 2015-09-10 | Discharge: 2015-09-10 | Disposition: A | Discharge status: Home / Self Care | Payer: Medicare Other | Source: Ambulatory Visit | Attending: Family Medicine | Admitting: Family Medicine

## 2015-09-10 DIAGNOSIS — Z1231 Encounter for screening mammogram for malignant neoplasm of breast: Secondary | ICD-10-CM

## 2015-09-23 ENCOUNTER — Ambulatory Visit (INDEPENDENT_AMBULATORY_CARE_PROVIDER_SITE_OTHER): Payer: Medicare Other | Admitting: Obstetrics

## 2015-09-23 ENCOUNTER — Encounter: Payer: Self-pay | Admitting: *Deleted

## 2015-09-23 ENCOUNTER — Encounter: Payer: Self-pay | Admitting: Obstetrics

## 2015-09-23 ENCOUNTER — Other Ambulatory Visit: Payer: Self-pay | Admitting: Obstetrics

## 2015-09-23 ENCOUNTER — Telehealth: Payer: Self-pay | Admitting: *Deleted

## 2015-09-23 VITALS — BP 113/73 | HR 99 | Temp 98.7°F | Wt 154.5 lb

## 2015-09-23 DIAGNOSIS — N904 Leukoplakia of vulva: Secondary | ICD-10-CM | POA: Diagnosis not present

## 2015-09-23 MED ORDER — CLOBETASOL PROPIONATE 0.05 % EX CREA: TOPICAL_CREAM | Freq: Two times a day (BID) | CUTANEOUS | Status: DC

## 2015-09-23 NOTE — Progress Notes (Addendum)
Patient ID: Stacy Henry, female   DOB: 10-02-1945, 70 y.o.   MRN: [id number]  Chief Complaint  Patient presents with  . Gynecologic Exam    Patient is itching in vulvar area. patient also has pain radiating. She has discharge also.    HPI Stacy Henry is a 70 y.o. female.  Severe vulva itching.  Also has vaginal discharge. HPI  Past Medical History:  Diagnosis Date  . Allergy   . GERD (gastroesophageal reflux disease)   . Ischemic colitis (Home) 2011  . Osteopenia     Past Surgical History:  Procedure Laterality Date  . APPENDECTOMY  as teenager  . BREAST SURGERY Bilateral    "lump"removal  . COLONOSCOPY  2006   RMR: normal.  . COLONOSCOPY  2012   [facility]: most c/w ischemic colitis, no path  . COLONOSCOPY N/A 12/03/2014   Procedure: COLONOSCOPY;  Surgeon: Daneil Dolin, MD;  Location: [facility];  Service: Endoscopy;  Laterality: N/A;  0900  . EYE SURGERY      Family History  Problem Relation Age of Onset  . Alcohol abuse Sister   . Diabetes Brother   . Heart attack Brother 6  . Diabetes Brother   . Cirrhosis Brother   . Alcohol abuse Brother   . Hypertension Mother   . Atrial fibrillation Mother   . Macular degeneration Mother   . Heart attack Father   . Kidney disease Sister   . Atrial fibrillation Sister   . Colon cancer Neg Hx     Social History Social History  Substance Use Topics  . Smoking status: Never Smoker  . Smokeless tobacco: Never Used  . Alcohol use Yes     Comment: ocassional wine - less than weekly    Allergies  Allergen Reactions  . Sulfa Antibiotics     Current Outpatient Prescriptions  Medication Sig Dispense Refill  . BIOTIN PO Take 1 tablet by mouth daily.    . fexofenadine-pseudoephedrine (ALLEGRA-D 24) 180-240 MG 24 hr tablet Take 1 po qd 30 tablet 2  . fluticasone (FLONASE) 50 MCG/ACT nasal spray Place 2 sprays into both nostrils daily. 17 g 1  . pantoprazole (PROTONIX) 40 MG tablet  TAKE 1 TABLET DAILY 90 tablet 0  . cefdinir (OMNICEF) 300 MG capsule Take 1 capsule (300 mg total) by mouth 2 (two) times daily. 1 po BID (Patient not taking: Reported on 09/23/2015) 20 capsule 0  . Cyanocobalamin (VITAMIN B-12 PO) Take 1 tablet by mouth daily.     Current Facility-Administered Medications  Medication Dose Route Frequency Provider Last Rate Last Dose  . clobetasol cream (TEMOVATE) 0.05 %   Topical BID Shelly Bombard, MD        Review of Systems Review of Systems Constitutional: negative for fatigue and weight loss Respiratory: negative for cough and wheezing Cardiovascular: negative for chest pain, fatigue and palpitations Gastrointestinal: negative for abdominal pain and change in bowel habits Genitourinary:positive for severe vulva, perineal and perianal itching Integument/breast: negative for nipple discharge Musculoskeletal:negative for myalgias Neurological: negative for gait problems and tremors Behavioral/Psych: negative for abusive relationship, depression Endocrine: negative for temperature intolerance     Blood pressure 113/73, pulse 99, temperature 98.7 F (37.1 C), weight 154 lb 8 oz (70.1 kg).  Physical Exam Physical Exam General:   alert  Skin:   no rash or abnormalities  Lungs:   clear to auscultation bilaterally  Heart:   regular rate and rhythm, S1, S2 normal, no  murmur, click, rub or gallop  Breasts:   normal without suspicious masses, skin or nipple changes or axillary nodes  Abdomen:  normal findings: no organomegaly, soft, non-tender and no hernia  Pelvis:  Diffuse vulva, perineal and perianal erythema with excoriations.  There is a white sheen to some of the erythema. Urinary system: urethral meatus normal and bladder without fullness, nontender Vaginal: normal without tenderness, induration or masses Cervix: normal appearance Adnexa: normal bimanual exam Uterus: anteverted and non-tender, normal size    Vulva Punch Biopsy Procedure  Note The area prepped with Betadine in routine sterile fashion.  The biopsy sit injected with 1% Xylocaine.  A 66mm Punch Biopsy then obtained and submitted to Pathology for evaluation.  The biopsy site closed with single suture of 4-0 Vicryl.  No active bleeding at conclusion of procedure.  Tolerated procedure well.  Angle Dirusso A. Iniya Matzek MD  50% of 15 min visit spent on counseling and coordination of care.   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  Meds ordered this encounter  Medications  . clobetasol cream (TEMOVATE) 0.05 %          Patient ID: Stacy Henry, female   DOB: July 11, 1945, 70 y.o.   MRN: [id number] Patient ID: Stacy Henry, female   DOB: 1945-01-24, 70 y.o.   MRN: [id number]

## 2015-09-23 NOTE — Telephone Encounter (Signed)
Was seen in office today medication not at pharmacy

## 2015-09-23 NOTE — Patient Instructions (Addendum)
Lichen Sclerosus Lichen sclerosus is a skin problem. It can happen on any part of the body, but it commonly involves the anal or genital areas. It can cause itching and discomfort in these areas. Treatment can help to control symptoms. When the genital area is affected, getting treatment is important because the condition can cause scarring that may lead to other problems. CAUSES The cause of this condition is not known. It could be the result of an overactive immune system or a lack of certain hormones. Lichen sclerosus is not an infection or a fungus. It is not passed from one person to another (not contagious). RISK FACTORS This condition is more likely to develop in women, usually after menopause. SYMPTOMS Symptoms of this condition include:  Thin, wrinkled, white areas on the skin.  Thickened white areas on the skin.  Red and swollen patches (lesions) on the skin.  Tears or cracks in the skin.  Bruising.  Blood blisters.  Severe itching. You may also have pain, itching, or burning with urination. Constipation is also common in people with lichen sclerosus. DIAGNOSIS This condition may be diagnosed with a physical exam. In some cases, a tissue sample (biopsy sample) may be removed to be looked at under a microscope. TREATMENT This condition is usually treated with medicated creams or ointments (topical steroids) that are applied over the affected areas. HOME CARE INSTRUCTIONS  Take over-the-counter and prescription medicines only as told by your health care provider.  Use creams or ointments as told by your health care provider.  Do not scratch the affected areas of skin.  Women should keep the vaginal area as clean and dry as possible.  Keep all follow-up visits as told by your health care provider. This is important. SEEK MEDICAL CARE IF:  You have increasing redness, swelling, or pain in the affected area.  You have fluid, blood, or pus coming from the affected  area.  You have new lesions on your skin.  You have pain or burning with urination.  You have pain during sex.   This information is not intended to replace advice given to you by your health care provider. Make sure you discuss any questions you have with your health care provider.   Document Released: 05/12/2010 Document Revised: 09/10/2014 Document Reviewed: 03/17/2014 Elsevier Interactive Patient Education 2016 Elsevier Inc.  

## 2015-09-24 ENCOUNTER — Encounter: Payer: Self-pay | Admitting: Obstetrics

## 2015-09-24 ENCOUNTER — Other Ambulatory Visit: Payer: Self-pay | Admitting: Obstetrics

## 2015-09-24 MED ORDER — CLOBETASOL PROPIONATE 0.05 % EX CREA: 1.0000 "application " | TOPICAL_CREAM | Freq: Two times a day (BID) | CUTANEOUS | 2 refills | Status: DC

## 2015-09-27 LAB — NUSWAB BV AND CANDIDA, NAA
CANDIDA ALBICANS, NAA: NEGATIVE
Candida glabrata, NAA: NEGATIVE

## 2015-10-14 ENCOUNTER — Ambulatory Visit: Payer: Medicare Other

## 2015-10-14 ENCOUNTER — Ambulatory Visit: Payer: Medicare Other | Admitting: Pharmacist

## 2015-10-20 ENCOUNTER — Encounter: Payer: Self-pay | Admitting: Pharmacist

## 2015-10-20 ENCOUNTER — Ambulatory Visit (INDEPENDENT_AMBULATORY_CARE_PROVIDER_SITE_OTHER): Payer: Medicare Other | Admitting: Pharmacist

## 2015-10-20 VITALS — BP 128/82 | HR 78 | Ht 65.0 in | Wt 156.0 lb

## 2015-10-20 DIAGNOSIS — R252 Cramp and spasm: Secondary | ICD-10-CM

## 2015-10-20 DIAGNOSIS — Z23 Encounter for immunization: Secondary | ICD-10-CM

## 2015-10-20 DIAGNOSIS — E7800 Pure hypercholesterolemia, unspecified: Secondary | ICD-10-CM

## 2015-10-20 DIAGNOSIS — Z0000 Encounter for general adult medical examination without abnormal findings: Secondary | ICD-10-CM | POA: Diagnosis not present

## 2015-10-20 DIAGNOSIS — D229 Melanocytic nevi, unspecified: Secondary | ICD-10-CM

## 2015-10-20 DIAGNOSIS — Z1159 Encounter for screening for other viral diseases: Secondary | ICD-10-CM

## 2015-10-20 DIAGNOSIS — M8580 Other specified disorders of bone density and structure, unspecified site: Secondary | ICD-10-CM

## 2015-10-20 MED ORDER — FLUTICASONE PROPIONATE 50 MCG/ACT NA SUSP: 2.0000 | Freq: Every day | NASAL | 1 refills | Status: DC

## 2015-10-20 NOTE — Patient Instructions (Addendum)
Stacy Henry , Thank you for taking time to come for your Medicare Wellness Visit. I appreciate your ongoing commitment to your health goals. Please review the following plan we discussed and let me know if I can assist you in the future.   These are the goals we discussed:  Increase exercise - goal is 150 minutes each week.   Increase non-starchy vegetables - carrots, green bean, squash, zucchini, tomatoes, onions, peppers, spinach and other green leafy vegetables, cabbage, lettuce, cucumbers, asparagus, okra (not fried), eggplant Limit sugar and processed foods (cakes, cookies, ice cream, crackers and chips) Increase fresh fruit but limit serving sizes 1/2 cup or about the size of tennis or baseball Limit red meat to no more than 1-2 times per week (serving size about the size of your palm) Choose whole grains / lean proteins - whole wheat bread, quinoa, whole grain rice (1/2 cup), fish, chicken, [place] Avoid sugar and calorie containing beverages - soda, sweet tea and juice.  Choose water or unsweetened tea instead.   This is a list of the screening recommended for you and due dates:  Health Maintenance  Topic Date Due  .  Hepatitis C: One time screening is recommended by Center for Disease Control  (CDC) for  adults born from [birth year] through 1966.   06-Sep-1945  . DEXA scan (bone density measurement)  04/23/2015  . Flu Shot  08/03/2016 - done today  . Mammogram  09/09/2017  . Tetanus Vaccine  01/04/2019  . Colon Cancer Screening  12/02/2024  . Shingles Vaccine  Completed  . Pneumonia vaccines  Completed    Calcium & Vitamin D: The Facts  Why is calcium and vitamin D consumption important? Calcium: . Most Americans do not consume adequate amounts of calcium! Calcium is required for proper muscle function, nerve communication, bone support, and many other functions in the body.  . The body uses bones as a source of calcium. Bones 'remodel' themselves continuously - the body constantly  breaks bone down to release calcium and rebuilds bones by replacing calcium in the bone later.  . As we get older, the rate of bone breakdown occurs faster than bone rebuilding which could lead to osteopenia, osteoporosis, and possible fractures.   Vitamin D: . People naturally make vitamin D in the body when sunlight hits the skin and triggers a process that leads to vitamin D production. This natural vitamin D production requires about 10-15 minutes of sun exposure on the hands, arms, and face at least 2-3 times per week. However, due to decreased sun exposure and the use of sunscreen, most people will need to get additional vitamin D from foods or supplements. Your doctor can measure your body's vitamin D level through a simple blood test to determine your daily vitamin D needs.  . Vitamin D is used to help the body absorb calcium, maintain bone health, help the immune system, and reduce inflammation. It also plays a role in muscle performance, balance and risk of falling.  . Vitamin D deficiency can lead to osteomalacia or softening of the bones, bone pain, and muscle weakness.   The recommended daily allowance of Calcium and Vitamin D varies for different age groups. Age group Calcium (mg) Vitamin D (IU)  Females and Males: Age 49-50 1000 mg 600 IU  Females: Age 41- 22 1200 mg 600 IU  Males: Age 65-70 1000 mg 600 IU  Females and Males: Age 75+ 1200 mg 800 IU  Pregnant/lactating Females age 43-50 1000 mg 600  IU   How much Calcium do you get in your diet? Calcium Intake # of servings per day  Total calcium (mg)  Skim milk, 2% milk (1 cup) _________ x 300 mg   Yogurt (1 small container) _________ x 200 mg   Cheese (1oz) _________ x 200 mg   Cottage Cheese (1 cup)             ________ x 150 mg   Almond milk (1 cup) _________ x 450 mg   Fortified Orange Juice (1 cup) _________ x 300 mg   Broccoli or spinach ( 1 cup) _________ x 100 mg   Salmon (3 oz) _________ x 150 mg    Almonds (1/4 cup)  _______ x 90 mg      How do we get Calcium and Vitamin D in our diet? Calcium: . Obtaining calcium from the diet is the most preferred way to reach the recommended daily goal. If this goal is not reached through diet, calcium supplements are available.  . Calcium is found in many foods including: dairy products, dark leafy vegetables (like broccoli, kale, and spinach), fish, and fortified products like juices and cereals.  . The food label will have a %DV (percent daily value) listed showing the amount of calcium per serving. To determine the total mg per serving, simply replace the % with zero (0).  For example, Almond Breeze almond milk contains 45% DV of calcium or 450mg  per 1 cup.  . You can increase the amount of calcium in your diet by using more calcium products in your daily meals. Use yogurt and fruit to make smoothies or use yogurt to top baked potatoes or make whipped potatoes. Sprinkle low fat cheese onto salads or into egg white omelets. You can even add non-fat dry milk powder (300mg  calcium per 1/3 cup) to hot cereals, meat loaf, soups, or potatoes.  . Calcium supplements come in many forms including tablets, chewables, and gummies. Be sure to read the label to determine the correct number of tablets per serving and whether or not to take the supplement with food.  . Calcium carbonate products (Oscal, Caltrate, and Viactiv) are generally better absorbed when taken with food while calcium citrate products like Citracal can be taken with or without food.  . The body can only absorb about 600 mg of calcium at one time. It is recommended to take calcium supplements in small amounts several times per day.  However, taking it all at once is better than not taking it at all. . Increasing your intake of calcium is essential for bone health, but may also lead to some side effects like constipation, increased gas, bloating or abdominal cramping. To help reduce these side effects, start with 1  tablet per day and slowly increase your intake of the supplement to the recommended doses. It is also recommended that you drink plenty of water each day. Vitamin D: . Very few foods naturally contain vitamin D. However, it is found in saltwater fish (like tuna, salmon and mackerel), beef liver, egg yolks, cheese and vitamin D fortified foods (like yogurt, cereals, orange juice and milk) . The amount of vitamin D in each food or product is listed as %DV on the product label. To determine the total amount of vitamin D per serving, drop the % sign and multiply the number by 4. For example, 1 cup of Almond Breeze almond milk contains 25% DV vitamin D or 100 IU per serving (25 x 4 =100). Marland Kitchen  Vitamin D is also found in multivitamins and supplements and may be listed as ergocalciferol (vitamin D2) or cholecalciferol (vitamin D3). Each of these forms of vitamin D are equivalent and the daily recommended intake will vary based on your age and the vitamin D levels in your body. Follow your doctor's recommendation for vitamin D intake.

## 2015-10-20 NOTE — Progress Notes (Signed)
Patient ID: Stacy Henry, female   DOB: 05-14-1945, 70 y.o.   MRN: [id number]    Subjective:   Stacy Henry is a 70 y.o. white female who presents for a subsequent Medicare Annual Wellness Visit.  Patient is pleasant and cooperative.    She is married and her husband is present with her today.  She reports that they have moved from a [facility] farm that was several acres to a home in [place] that is on 4 acres within the last 3 months.  She has been active packing and unpacking but not exercising as much.    Review of Systems  Review of Systems  Constitutional: Negative.   HENT: Negative for congestion.   Eyes: Negative.   Respiratory: Negative.   Gastrointestinal: Negative.   Genitourinary: Negative.   Musculoskeletal: Negative.   Skin: Negative.        Atypical mole on lower right leg.  Neurological: Negative for headaches.  Endo/Heme/Allergies: Negative.   Psychiatric/Behavioral: Negative.    Current Medications (verified) Outpatient Encounter Prescriptions as of 10/20/2015  Medication Sig  . BIOTIN PO Take 1 tablet by mouth daily.  . clobetasol cream (TEMOVATE) 1.44 % Apply 1 application topically 2 (two) times daily.  . fexofenadine-pseudoephedrine (ALLEGRA-D 24) 180-240 MG 24 hr tablet Take 1 po qd  . fluticasone (FLONASE) 50 MCG/ACT nasal spray Place 2 sprays into both nostrils daily.  . pantoprazole (PROTONIX) 40 MG tablet TAKE 1 TABLET DAILY  . [DISCONTINUED] cefdinir (OMNICEF) 300 MG capsule Take 1 capsule (300 mg total) by mouth 2 (two) times daily. 1 po BID (Patient not taking: Reported on 10/20/2015)  . [DISCONTINUED] Cyanocobalamin (VITAMIN B-12 PO) Take 1 tablet by mouth daily.   No facility-administered encounter medications on file as of 10/20/2015.     Allergies (verified) Sulfa antibiotics   History: Past Medical History:  Diagnosis Date  . Allergy   . GERD (gastroesophageal reflux disease)   . Ischemic colitis ([place]) 2011  .  Osteopenia    Past Surgical History:  Procedure Laterality Date  . APPENDECTOMY  as teenager  . BREAST SURGERY Bilateral    "lump"removal  . COLONOSCOPY  2006   RMR: normal.  . COLONOSCOPY  2012   [facility]: most c/w ischemic colitis, no path  . COLONOSCOPY N/A 12/03/2014   Procedure: COLONOSCOPY;  Surgeon: Daneil Dolin, MD;  Location: [facility];  Service: Endoscopy;  Laterality: N/A;  0900  . EYE SURGERY Bilateral    Family History  Problem Relation Age of Onset  . Alcohol abuse Sister   . Diabetes Brother   . Heart attack Brother 52  . Aneurysm Brother   . Diabetes Brother   . Cirrhosis Brother   . Alcohol abuse Brother   . Hypertension Mother   . Atrial fibrillation Mother   . Macular degeneration Mother   . Heart attack Father   . Kidney disease Sister   . Atrial fibrillation Sister   . Colon cancer Neg Hx    Social History   Occupational History  . Not on file.   Social History Main Topics  . Smoking status: Never Smoker  . Smokeless tobacco: Never Used  . Alcohol use Yes     Comment: ocassional wine - less than weekly  . Drug use: No  . Sexual activity: Yes    Partners: Male    Do you feel safe at home?  Yes  Dietary issues and exercise activities: Current Exercise Habits: The  patient does not participate in regular exercise at present  Current Dietary habits:  Eats 3 meals per day; rarely snacks.  Eats fruits and vegetables regularly.  Objective:    Today's Vitals   10/20/15 1431  BP: 128/82  Pulse: 78  Weight: 156 lb (70.8 kg)  Height: 5' 5" (1.651 m)   Body mass index is 25.96 kg/m.  Activities of Daily Living In your present state of health, do you have any difficulty performing the following activities: 10/20/2015 12/10/2014  Hearing? Y N  Vision? N N  Difficulty concentrating or making decisions? N N  Walking or climbing stairs? N N  Dressing or bathing? N N  Doing errands, shopping? N N  Preparing Food and  eating ? N N  Using the Toilet? N N  In the past six months, have you accidently leaked urine? N N  Do you have problems with loss of bowel control? N N  Managing your Medications? N N  Managing your Finances? N N  Housekeeping or managing your Housekeeping? N N  Some recent data might be hidden    Are there smokers in your home (other than you)? No   Cardiac Risk Factors include: advanced age (>57mn, >>60women);sedentary lifestyle  Depression Screen PHQ 2/9 Scores 10/20/2015 02/26/2015 12/10/2014 10/09/2014  PHQ - 2 Score 0 0 0 3  PHQ- 9 Score - - - 8    Fall Risk Fall Risk  10/20/2015 12/10/2014 10/09/2014 07/08/2014 04/28/2014  Falls in the past year? _0     Cognitive Function: MMSE - Mini Mental State Exam 10/20/2015 12/10/2014  Orientation to time 5 5  Orientation to Place 5 5  Registration 3 3  Attention/ Calculation 5 5  Recall 3 3  Language- name 2 objects 2 2  Language- repeat 1 1  Language- follow 3 step command 3 3  Language- read & follow direction 1 1  Write a sentence 1 1  Copy design 1 1  Total score 30 30    Immunizations and Health Maintenance Immunization History  Administered Date(s) Administered  . Influenza,inj,Quad PF,36+ Mos 10/25/2012, 12/10/2014, 10/20/2015  . Pneumococcal Conjugate-13 12/10/2014  . Pneumococcal Polysaccharide-23 04/21/2009  . Td 01/03/2009  . Zoster 04/21/2009   Health Maintenance Due  Topic Date Due  . Hepatitis C Screening  022-Jun-1947 . DEXA SCAN  04/23/2015  . INFLUENZA VACCINE  08/04/2015    Patient Care Team: SWardell Honour MD as PCP - General (Family Medicine) FDruscilla Brownie MD as Consulting Physician (Dermatology) RDaneil Dolin MD as Consulting Physician (Gastroenterology)  Indicate any recent Medical Services you may have received from other than [facility] providers in the past year (date may be approximate).    Assessment:    Annual Wellness Visit  Atypical  mole Hyperlipidemia osteopenia   Screening Tests Health Maintenance  Topic Date Due  . Hepatitis C Screening  01946/12/20 . DEXA SCAN  04/23/2015  . INFLUENZA VACCINE  08/04/2015  . MAMMOGRAM  09/09/2017  . TETANUS/TDAP  01/04/2019  . COLONOSCOPY  12/02/2024  . ZOSTAVAX  Completed  . PNA vac Low Risk Adult  Completed        Plan:   During the course of the visit RAshyahwas educated and counseled about the following appropriate screening and preventive services:   Vaccines to include Pneumoccal, Influenza, Td, Zostavax - discussed benefit of above vaccinations.  Patient received influenza.  All other vaccines are UTD  Colorectal cancer screening -  UTD  BP at goal today and last LDL was 97 but in need of recheck lipids  Diabetes screening - last FBG was WNL but needs recheck BMET  Bone Denisty / Osteoporosis Screening - order sent  Mammogram - UTD (q 2 years)  Glaucoma screening / Diabetic Eye Exam - UTD  Nutrition counseling - discussed limiting fat and red meat in diet  Increase lean sources of protein such as fish, [place] and chicken.  Increase non starchy vegetables.  Limit sugar and processed foods.   Discussed calcium intake  Advanced Directives information packet given and reviewed in office today.   Physical activity - patient plans to start walking program.  Goal is 150 minutes per week.  Orders Placed This Encounter  Procedures  . DG [facility] DEXA    Standing Status:   Future    Standing Expiration Date:   01/20/2016    Order Specific Question:   Reason for Exam (SYMPTOM  OR DIAGNOSIS REQUIRED)    Answer:   osteopenia  . Flu Vaccine QUAD 36+ mos IM  . CMP14+EGFR    Standing Status:   Future    Standing Expiration Date:   01/20/2016  . Lipid panel    Standing Status:   Future    Standing Expiration Date:   01/20/2016  . Magnesium    Standing Status:   Future    Standing Expiration Date:   01/20/2016  . Hepatitis C antibody    Standing Status:   Future     Standing Expiration Date:   01/20/2016  . Ambulatory referral to Dermatology    Referral Priority:   Routine    Referral Type:   Consultation    Referral Reason:   Specialty Services Required    Referred to Provider:   Druscilla Brownie, MD    Requested Specialty:   Dermatology    Number of Visits Requested:   1    Patient Instructions (the written plan) were given to the patient.   Cherre Robins, PharmD   10/20/2015

## 2015-11-05 DIAGNOSIS — L820 Inflamed seborrheic keratosis: Secondary | ICD-10-CM | POA: Diagnosis not present

## 2015-11-05 DIAGNOSIS — L814 Other melanin hyperpigmentation: Secondary | ICD-10-CM | POA: Diagnosis not present

## 2015-11-05 DIAGNOSIS — L821 Other seborrheic keratosis: Secondary | ICD-10-CM | POA: Diagnosis not present

## 2015-11-30 ENCOUNTER — Encounter: Payer: Self-pay | Admitting: Family

## 2015-11-30 ENCOUNTER — Ambulatory Visit (INDEPENDENT_AMBULATORY_CARE_PROVIDER_SITE_OTHER): Payer: Medicare Other | Admitting: Family

## 2015-11-30 VITALS — BP 136/79 | HR 108 | Temp 101.2°F | Ht 65.0 in | Wt 157.0 lb

## 2015-11-30 DIAGNOSIS — R6889 Other general symptoms and signs: Secondary | ICD-10-CM | POA: Diagnosis not present

## 2015-11-30 DIAGNOSIS — J0100 Acute maxillary sinusitis, unspecified: Secondary | ICD-10-CM

## 2015-11-30 DIAGNOSIS — H109 Unspecified conjunctivitis: Secondary | ICD-10-CM

## 2015-11-30 LAB — VERITOR FLU A/B WAIVED
INFLUENZA A: NEGATIVE
Influenza B: NEGATIVE

## 2015-11-30 MED ORDER — POLYMYXIN B-TRIMETHOPRIM 10000-0.1 UNIT/ML-% OP SOLN: 1.0000 [drp] | OPHTHALMIC | 0 refills | Status: DC

## 2015-11-30 MED ORDER — AMOXICILLIN-POT CLAVULANATE 875-125 MG PO TABS: 1.0000 | ORAL_TABLET | Freq: Two times a day (BID) | ORAL | 0 refills | Status: DC

## 2015-11-30 NOTE — Patient Instructions (Signed)

## 2015-11-30 NOTE — Progress Notes (Signed)
Subjective:    Patient ID: Stacy Henry, female    DOB: 01-31-1945, 70 y.o.   MRN: [id number]  Cough  This is a new problem. The current episode started in the past 7 days. The problem has been gradually worsening. The problem occurs every few minutes. The cough is productive of purulent sputum. Associated symptoms include chills, eye redness, a fever, headaches, myalgias, postnasal drip and shortness of breath. Pertinent negatives include no ear congestion, ear pain, nasal congestion, rhinorrhea or wheezing. The symptoms are aggravated by lying down. She has tried OTC cough suppressant and rest for the symptoms. The treatment provided mild relief. There is no history of asthma or COPD.  Sore Throat   Associated symptoms include coughing, diarrhea, headaches and shortness of breath. Pertinent negatives include no ear pain.  Diarrhea   Associated symptoms include chills, coughing, a fever, headaches and myalgias.  Eye Problem   The left eye is affected. This is a new problem. The current episode started in the past 7 days. The problem occurs constantly. The problem has been unchanged. There was no injury mechanism. The pain is moderate. Associated symptoms include an eye discharge, eye redness, a fever and itching. She has tried eye drops for the symptoms. The treatment provided no relief.      Review of Systems  Constitutional: Positive for chills and fever.  HENT: Positive for postnasal drip. Negative for ear pain and rhinorrhea.   Eyes: Positive for discharge, redness and itching.  Respiratory: Positive for cough and shortness of breath. Negative for wheezing.   Gastrointestinal: Positive for diarrhea.  Musculoskeletal: Positive for myalgias.  Neurological: Positive for headaches.  All other systems reviewed and are negative.      Objective:   Physical Exam  Constitutional: She is oriented to person, place, and time. She appears well-developed and well-nourished. No distress.   HENT:  Head: Normocephalic and atraumatic.  Nose: Mucosal edema and rhinorrhea present. Right sinus exhibits maxillary sinus tenderness. Left sinus exhibits maxillary sinus tenderness.  Mouth/Throat: Posterior oropharyngeal edema and posterior oropharyngeal erythema present.  Eyes: Pupils are equal, round, and reactive to light. Left eye exhibits discharge. Left conjunctiva has a hemorrhage.  Neck: Normal range of motion. Neck supple. No thyromegaly present.  Cardiovascular: Normal rate, regular rhythm, normal heart sounds and intact distal pulses.   No murmur heard. Pulmonary/Chest: Effort normal and breath sounds normal. No respiratory distress. She has no wheezes.  Abdominal: Soft. Bowel sounds are normal. She exhibits no distension. There is no tenderness.  Musculoskeletal: Normal range of motion. She exhibits no edema or tenderness.  Neurological: She is alert and oriented to person, place, and time. She has normal reflexes. No cranial nerve deficit.  Skin: Skin is warm and dry.  Psychiatric: She has a normal mood and affect. Her behavior is normal. Judgment and thought content normal.  Vitals reviewed.     BP 136/79   Pulse (!) 108   Temp (!) 101.2 F (38.4 C) (Oral)   Ht 5\' 5"  (1.651 m)   Wt 157 lb (71.2 kg)   BMI 26.13 kg/m      Assessment & Plan:  1. Flu-like symptoms - Veritor Flu A/B Waived  2. Bacterial conjunctivitis of left eye -Good hand hygiene discussed -Do not rub eyes -Warm compresses - trimethoprim-polymyxin b (POLYTRIM) ophthalmic solution; Place 1 drop into the left eye every 4 (four) hours.  Dispense: 10 mL; Refill: 0  3. Acute maxillary sinusitis, recurrence not specified -- Take meds  as prescribed - Use a cool mist humidifier  -Use saline nose sprays frequently -Saline irrigations of the nose can be very helpful if done frequently.  * 4X daily for 1 week*  * Use of a nettie pot can be helpful with this. Follow directions with this* -Force  fluids -For any cough or congestion  Use plain Mucinex- regular strength or max strength is fine   * Children- consult with Pharmacist for dosing -For fever or aces or pains- take tylenol or ibuprofen appropriate for age and weight.  * for fevers greater than 101 orally you may alternate ibuprofen and tylenol every  3 hours. -Throat lozenges if help -New toothbrush 3 days - amoxicillin-clavulanate (AUGMENTIN) 875-125 MG tablet; Take 1 tablet by mouth 2 (two) times daily.  Dispense: 14 tablet; Refill: 0  Evelina Dun, FNP

## 2015-12-01 ENCOUNTER — Telehealth: Payer: Self-pay | Admitting: Family

## 2015-12-01 MED ORDER — BENZONATATE 200 MG PO CAPS
200.0000 mg | ORAL_CAPSULE | Freq: Three times a day (TID) | ORAL | 1 refills | Status: DC | PRN
Start: 2015-12-01 — End: 2016-01-15

## 2015-12-01 NOTE — Telephone Encounter (Signed)
Please advise 

## 2015-12-01 NOTE — Telephone Encounter (Signed)
Tessalon Prescription sent to pharmacy   

## 2015-12-01 NOTE — Telephone Encounter (Signed)
Pt husband aware

## 2015-12-04 ENCOUNTER — Telehealth: Payer: Self-pay | Admitting: Family

## 2015-12-04 NOTE — Telephone Encounter (Signed)
Spoke with pt regarding sxs She is still taking antibiotic Fever occasionally She will try salt water gargles with motrin or tylenol for sore throat Will come in for appt if no better

## 2016-01-15 ENCOUNTER — Ambulatory Visit (INDEPENDENT_AMBULATORY_CARE_PROVIDER_SITE_OTHER): Payer: Medicare Other | Admitting: Nurse Practitioner

## 2016-01-15 ENCOUNTER — Encounter: Payer: Self-pay | Admitting: Nurse Practitioner

## 2016-01-15 VITALS — BP 135/77 | HR 114 | Temp 99.1°F | Ht 65.0 in | Wt 159.0 lb

## 2016-01-15 DIAGNOSIS — R52 Pain, unspecified: Secondary | ICD-10-CM

## 2016-01-15 DIAGNOSIS — R079 Chest pain, unspecified: Secondary | ICD-10-CM | POA: Diagnosis not present

## 2016-01-15 DIAGNOSIS — J101 Influenza due to other identified influenza virus with other respiratory manifestations: Secondary | ICD-10-CM | POA: Diagnosis not present

## 2016-01-15 LAB — VERITOR FLU A/B WAIVED
INFLUENZA A: POSITIVE — AB
Influenza B: NEGATIVE

## 2016-01-15 MED ORDER — OSELTAMIVIR PHOSPHATE 75 MG PO CAPS: 75.0000 mg | ORAL_CAPSULE | Freq: Two times a day (BID) | ORAL | 0 refills | Status: DC

## 2016-01-15 NOTE — Progress Notes (Signed)
   Subjective:    Patient ID: Stacy Henry, female    DOB: 11/02/1945, 71 y.o.   MRN: [id number]  HPI  Patient comes in today c/o achy all over with sore throat and congestion- slight cough. Has heavy feeling on her chest.    Review of Systems  Constitutional: Positive for chills and fever.  HENT: Positive for congestion, sore throat and voice change.   Respiratory: Positive for cough and shortness of breath.   Cardiovascular: Positive for chest pain.  Gastrointestinal: Negative.   Genitourinary: Negative.   Neurological: Negative.   Psychiatric/Behavioral: Negative.   All other systems reviewed and are negative.      Objective:   Physical Exam  Constitutional: She is oriented to person, place, and time. She appears well-developed and well-nourished. She appears distressed (mild).  HENT:  Right Ear: Hearing, tympanic membrane, external ear and ear canal normal.  Left Ear: Hearing, tympanic membrane, external ear and ear canal normal.  Nose: Mucosal edema and rhinorrhea present. Right sinus exhibits maxillary sinus tenderness. Right sinus exhibits no frontal sinus tenderness. Left sinus exhibits maxillary sinus tenderness.  Mouth/Throat: Uvula is midline, oropharynx is clear and moist and mucous membranes are normal.  Cardiovascular: Normal rate and regular rhythm.   Pulmonary/Chest: Effort normal. No respiratory distress. She has no wheezes. She has no rales. She exhibits no tenderness.  Deep dry cough  Abdominal: Soft. Bowel sounds are normal.  Neurological: She is alert and oriented to person, place, and time.  Skin: Skin is warm.  Psychiatric: She has a normal mood and affect. Her behavior is normal. Judgment and thought content normal.   BP 135/77   Pulse (!) 114   Temp 99.1 F (37.3 C) (Oral)   Ht 5\' 5"  (1.651 m)   Wt 159 lb (72.1 kg)   BMI 26.46 kg/m   FLU A positive  EKG- sinus Merideth Abbey, FNP      Assessment & Plan:   1. Body  aches   2. Chest pain, unspecified type   3. Influenza A    1. Take meds as prescribed 2. Use a cool mist humidifier especially during the winter months and when heat has been humid. 3. Use saline nose sprays frequently 4. Saline irrigations of the nose can be very helpful if done frequently.  * 4X daily for 1 week*  * Use of a nettie pot can be helpful with this. Follow directions with this* 5. Drink plenty of fluids 6. Keep thermostat turn down low 7.For any cough or congestion  Use plain Mucinex- regular strength or max strength is fine   * Children- consult with Pharmacist for dosing 8. For fever or aces or pains- take tylenol or ibuprofen appropriate for age and weight.  * for fevers greater than 101 orally you may alternate ibuprofen and tylenol every  3 hours.   Meds ordered this encounter  Medications  . oseltamivir (TAMIFLU) 75 MG capsule    Sig: Take 1 capsule (75 mg total) by mouth 2 (two) times daily.    Dispense:  10 capsule    Refill:  0    Order Specific Question:   Supervising Provider    Answer:   Eustaquio Maize Newberry, FNP

## 2016-01-15 NOTE — Patient Instructions (Signed)

## 2016-01-29 ENCOUNTER — Other Ambulatory Visit: Payer: Self-pay | Admitting: Family Medicine

## 2016-05-10 ENCOUNTER — Encounter: Payer: Self-pay | Admitting: Pediatrics

## 2016-05-10 ENCOUNTER — Ambulatory Visit (INDEPENDENT_AMBULATORY_CARE_PROVIDER_SITE_OTHER): Payer: Medicare Other | Admitting: Pediatrics

## 2016-05-10 VITALS — BP 132/82 | HR 81 | Temp 97.9°F | Ht 65.0 in | Wt 161.3 lb

## 2016-05-10 DIAGNOSIS — J3089 Other allergic rhinitis: Secondary | ICD-10-CM | POA: Diagnosis not present

## 2016-05-10 DIAGNOSIS — B001 Herpesviral vesicular dermatitis: Secondary | ICD-10-CM

## 2016-05-10 DIAGNOSIS — Z1159 Encounter for screening for other viral diseases: Secondary | ICD-10-CM | POA: Diagnosis not present

## 2016-05-10 DIAGNOSIS — G2581 Restless legs syndrome: Secondary | ICD-10-CM

## 2016-05-10 DIAGNOSIS — G4700 Insomnia, unspecified: Secondary | ICD-10-CM | POA: Diagnosis not present

## 2016-05-10 MED ORDER — FLUTICASONE PROPIONATE 50 MCG/ACT NA SUSP: 2.0000 | Freq: Every day | NASAL | 1 refills | Status: DC

## 2016-05-10 MED ORDER — PRAMIPEXOLE DIHYDROCHLORIDE 0.125 MG PO TABS: ORAL_TABLET | ORAL | 1 refills | Status: DC

## 2016-05-10 MED ORDER — VALACYCLOVIR HCL 1 G PO TABS: ORAL_TABLET | ORAL | 1 refills | Status: DC

## 2016-05-10 NOTE — Progress Notes (Signed)
  Subjective:   Patient ID: Stacy Henry, female    DOB: 1945/12/22, 71 y.o.   MRN: [id number] CC: Restless legs and Trouble sleeping  HPI: Stacy Henry is a 71 y.o. female presenting for Restless legs and Trouble sleeping  Insomnia: worsening since retirement in 2013 Working in yard throughout day, walking dog regularly Not napping in the day Gets sleepy at [employer] hot, cold Legs from knee down she has to keep moving Lies down from [place] up [phone] Drinks some caffeine in the morning, one cup of coffee in the morning Tried melatonin Feels tired in the morning, never well rested  Restless leg: Tried OTC meds  Cold sores: gets them every once in a while, resolve with medicine  Allergy symptoms ongoing now, always worse this time of year  Relevant past medical, surgical, family and social history reviewed. Allergies and medications reviewed and updated. History  Smoking Status  . Never Smoker  Smokeless Tobacco  . Never Used   ROS: Per HPI   Objective:    BP 132/82   Pulse 81   Temp 97.9 F (36.6 C) (Oral)   Ht _0  (1.651 m)   Wt 161 lb 4.8 oz (73.2 kg)   BMI 26.84 kg/m   Wt Readings from Last 3 Encounters:  05/10/16 161 lb 4.8 oz (73.2 kg)  01/15/16 159 lb (72.1 kg)  11/30/15 157 lb (71.2 kg)    Gen: NAD, alert, cooperative with exam, NCAT EYES: EOMI, no conjunctival injection, or no icterus ENT:  TMs pearly gray b/l, OP without erythema LYMPH: no cervical LAD CV: NRRR, normal S1/S2, no murmur, distal pulses 2+ b/l Resp: CTABL, no wheezes, normal WOB Abd: +BS, soft, NTND. no guarding or organomegaly Ext: No edema, warm Neuro: Alert and oriented, strength equal b/l UE and LE, coordination grossly normal, normal sensation b/l lower leg MSK: normal muscle bulk  Assessment & Plan:  Stacy Henry was seen today for restless legs and trouble sleeping.  Diagnoses and all orders for this visit:  Restless leg Check labs, increase regular  walking/exercise during the day, stretching at night, start pramipexole, increase each week until symptoms improved rtc 4 weeks -     CMP14+EGFR -     CBC with Differential/Platelet -     TSH -     pramipexole (MIRAPEX) 0.125 MG tablet; Take one tab for a week, then increase to 2 tabs for a week, then 3 tabs for a week, then 4 tabs -     Lipid panel  Need for hepatitis C screening test -     Hepatitis C antibody  Allergic rhinitis due to other allergic trigger, unspecified seasonality Ongoing symptoms, start below -     fluticasone (FLONASE) 50 MCG/ACT nasal spray; Place 2 sprays into both nostrils daily.  Herpes labialis Stable, below treats flares -     valACYclovir (VALTREX) 1000 MG tablet; 2gm twice a day for 24h for symptoms  Insomnia, unspecified type Discussed sleep hygiene, will start RLS treatment as above  Follow up plan: 4 weeks Assunta Found, MD [place]

## 2016-05-11 LAB — LIPID PANEL
CHOLESTEROL TOTAL: 194 mg/dL (ref 100–199)
Chol/HDL Ratio: 3.8 ratio (ref 0.0–4.4)
HDL: 51 mg/dL (ref >39)
LDL Calculated: 100 mg/dL — ABNORMAL HIGH (ref 0–99)
TRIGLYCERIDES: 217 mg/dL — AB (ref 0–149)
VLDL CHOLESTEROL CAL: 43 mg/dL — AB (ref 5–40)

## 2016-05-11 LAB — CBC WITH DIFFERENTIAL/PLATELET
BASOS: 0 %
Basophils Absolute: 0 10*3/uL (ref 0.0–0.2)
EOS (ABSOLUTE): 0.4 10*3/uL (ref 0.0–0.4)
EOS: 5 %
HEMATOCRIT: 42.6 % (ref 34.0–46.6)
HEMOGLOBIN: 13.7 g/dL (ref 11.1–15.9)
IMMATURE GRANS (ABS): 0 10*3/uL (ref 0.0–0.1)
IMMATURE GRANULOCYTES: 0 %
LYMPHS: 45 %
Lymphocytes Absolute: 3.8 10*3/uL — ABNORMAL HIGH (ref 0.7–3.1)
MCH: 28.5 pg (ref 26.6–33.0)
MCHC: 32.2 g/dL (ref 31.5–35.7)
MCV: 89 fL (ref 79–97)
MONOS ABS: 1 10*3/uL — AB (ref 0.1–0.9)
Monocytes: 11 %
NEUTROS PCT: 39 %
Neutrophils Absolute: 3.3 10*3/uL (ref 1.4–7.0)
Platelets: 250 10*3/uL (ref 150–379)
RBC: 4.81 x10E6/uL (ref 3.77–5.28)
RDW: 14.9 % (ref 12.3–15.4)
WBC: 8.4 10*3/uL (ref 3.4–10.8)

## 2016-05-11 LAB — CMP14+EGFR
ALK PHOS: 63 IU/L (ref 39–117)
ALT: 26 IU/L (ref 0–32)
AST: 32 IU/L (ref 0–40)
Albumin/Globulin Ratio: 1.1 — ABNORMAL LOW (ref 1.2–2.2)
Albumin: 4 g/dL (ref 3.5–4.8)
BUN/Creatinine Ratio: 20 (ref 12–28)
BUN: 20 mg/dL (ref 8–27)
Bilirubin Total: 0.3 mg/dL (ref 0.0–1.2)
CALCIUM: 9.3 mg/dL (ref 8.7–10.3)
CO2: 27 mmol/L (ref 18–29)
CREATININE: 0.99 mg/dL (ref 0.57–1.00)
Chloride: 99 mmol/L (ref 96–106)
GFR calc Af Amer: 66 mL/min/{1.73_m2} (ref >59)
GFR, EST NON AFRICAN AMERICAN: 57 mL/min/{1.73_m2} — AB (ref >59)
GLOBULIN, TOTAL: 3.6 g/dL (ref 1.5–4.5)
GLUCOSE: 94 mg/dL (ref 65–99)
Potassium: 4 mmol/L (ref 3.5–5.2)
Sodium: 142 mmol/L (ref 134–144)
Total Protein: 7.6 g/dL (ref 6.0–8.5)

## 2016-05-11 LAB — TSH: TSH: 0.753 u[IU]/mL (ref 0.450–4.500)

## 2016-05-11 LAB — HEPATITIS C ANTIBODY: Hep C Virus Ab: <0.1 s/co ratio (ref 0.0–0.9)

## 2016-05-20 ENCOUNTER — Other Ambulatory Visit: Payer: Self-pay | Admitting: *Deleted

## 2016-05-20 MED ORDER — PRAVASTATIN SODIUM 40 MG PO TABS: 40.0000 mg | ORAL_TABLET | Freq: Every day | ORAL | 6 refills | Status: DC

## 2016-05-24 ENCOUNTER — Other Ambulatory Visit (HOSPITAL_COMMUNITY)
Admission: RE | Admit: 2016-05-24 | Discharge: 2016-05-24 | Disposition: A | Discharge status: Home / Self Care | Payer: Medicare Other | Source: Ambulatory Visit | Attending: Obstetrics | Admitting: Obstetrics

## 2016-05-24 ENCOUNTER — Ambulatory Visit (INDEPENDENT_AMBULATORY_CARE_PROVIDER_SITE_OTHER): Payer: Medicare Other | Admitting: Obstetrics

## 2016-05-24 ENCOUNTER — Encounter: Payer: Self-pay | Admitting: Obstetrics

## 2016-05-24 VITALS — BP 157/94 | HR 96 | Ht 65.0 in | Wt 160.0 lb

## 2016-05-24 DIAGNOSIS — N898 Other specified noninflammatory disorders of vagina: Secondary | ICD-10-CM

## 2016-05-24 DIAGNOSIS — Z01419 Encounter for gynecological examination (general) (routine) without abnormal findings: Secondary | ICD-10-CM | POA: Diagnosis not present

## 2016-05-24 DIAGNOSIS — B373 Candidiasis of vulva and vagina: Secondary | ICD-10-CM | POA: Diagnosis not present

## 2016-05-24 DIAGNOSIS — B3731 Acute candidiasis of vulva and vagina: Secondary | ICD-10-CM

## 2016-05-24 DIAGNOSIS — N952 Postmenopausal atrophic vaginitis: Secondary | ICD-10-CM

## 2016-05-24 MED ORDER — CICLOPIROX OLAMINE 0.77 % EX CREA: TOPICAL_CREAM | Freq: Two times a day (BID) | CUTANEOUS | 2 refills | Status: DC

## 2016-05-24 MED ORDER — FLUCONAZOLE 200 MG PO TABS: 200.0000 mg | ORAL_TABLET | ORAL | 2 refills | Status: DC

## 2016-05-24 MED ORDER — ESTRADIOL-LEVONORGESTREL 0.045-0.015 MG/DAY TD PTWK: 1.0000 | MEDICATED_PATCH | TRANSDERMAL | 12 refills | Status: DC

## 2016-05-24 NOTE — Progress Notes (Signed)
Subjective:        Stacy Henry is a 71 y.o. female here for a routine exam.  Current complaints: Severe itching of vulva and vagina.  History of Lichen Sclerosus of vulva, treated with Clobetasol.  Applied Clobetasol to this condition without relief.  Denies vaginal discharge.  Personal health questionnaire:  Is patient Ashkenazi Jewish, have a family history of breast and/or ovarian cancer: no Is there a family history of uterine cancer diagnosed at age < 3, gastrointestinal cancer, urinary tract cancer, family member who is a Field seismologist syndrome-associated carrier: no Is the patient overweight and hypertensive, family history of diabetes, personal history of gestational diabetes, preeclampsia or PCOS: no Is patient over 42, have PCOS,  family history of premature CHD under age 58, diabetes, smoke, have hypertension or peripheral artery disease:  no At any time, has a partner hit, kicked or otherwise hurt or frightened you?: no Over the past 2 weeks, have you felt down, depressed or hopeless?: no Over the past 2 weeks, have you felt little interest or pleasure in doing things?:no   Gynecologic History No LMP recorded. Patient is postmenopausal. Contraception: post menopausal status Last Pap: 2011. Results were: ASCUS Last mammogram: 2017. Results were: normal  Obstetric History OB History  Gravida Para Term Preterm AB Living  1 1          SAB TAB Ectopic Multiple Live Births          1    # Outcome Date GA Lbr Len/2nd Weight Sex Delivery Anes PTL Lv  1 Para               Past Medical History:  Diagnosis Date  . Allergy   . GERD (gastroesophageal reflux disease)   . Ischemic colitis ([place]) 2011  . Osteopenia     Past Surgical History:  Procedure Laterality Date  . APPENDECTOMY  as teenager  . BREAST SURGERY Bilateral    "lump"removal  . COLONOSCOPY  2006   RMR: normal.  . COLONOSCOPY  2012   [facility]: most c/w ischemic colitis, no path  .  COLONOSCOPY N/A 12/03/2014   Procedure: COLONOSCOPY;  Surgeon: Daneil Dolin, MD;  [facility]: [facility];  Service: Endoscopy;  Laterality: N/A;  0900  . EYE SURGERY Bilateral      Current Outpatient Prescriptions:  .  BIOTIN PO, Take 1 tablet by mouth daily., Disp: , Rfl:  .  clobetasol cream (TEMOVATE) 7.93 %, Apply 1 application topically 2 (two) times daily., Disp: 60 g, Rfl: 2 .  fluticasone (FLONASE) 50 MCG/ACT nasal spray, Place 2 sprays into both nostrils daily., Disp: 17 g, Rfl: 1 .  pantoprazole (PROTONIX) 40 MG tablet, TAKE 1 TABLET DAILY, Disp: 90 tablet, Rfl: 3 .  pramipexole (MIRAPEX) 0.125 MG tablet, Take one tab for a week, then increase to 2 tabs for a week, then 3 tabs for a week, then 4 tabs, Disp: 70 tablet, Rfl: 1 .  pravastatin (PRAVACHOL) 40 MG tablet, Take 1 tablet (40 mg total) by mouth at bedtime., Disp: 30 tablet, Rfl: 6 .  ciclopirox (LOPROX) 0.77 % cream, Apply topically 2 (two) times daily., Disp: 90 g, Rfl: 2 .  estradiol-levonorgestrel (CLIMARA PRO) 0.045-0.015 MG/DAY, Place 1 patch onto the skin once a week., Disp: 4 patch, Rfl: 12 .  fluconazole (DIFLUCAN) 200 MG tablet, Take 1 tablet (200 mg total) by mouth every other day., Disp: 3 tablet, Rfl: 2 .  valACYclovir (VALTREX) 1000 MG tablet,  2gm twice a day for 24h for symptoms (Patient not taking: Reported on 05/24/2016), Disp: 20 tablet, Rfl: 1 Allergies  Allergen Reactions  . Sulfa Antibiotics     Social History  Substance Use Topics  . Smoking status: Never Smoker  . Smokeless tobacco: Never Used  . Alcohol use Yes     Comment: ocassional wine - less than weekly    Family History  Problem Relation Age of Onset  . Alcohol abuse Sister   . Diabetes Brother   . Heart attack Brother 10  . Aneurysm Brother   . Diabetes Brother   . Cirrhosis Brother   . Alcohol abuse Brother   . Hypertension Mother   . Atrial fibrillation Mother   . Macular degeneration Mother   . Heart attack Father   . Kidney  disease Sister   . Atrial fibrillation Sister   . Colon cancer Neg Hx       Review of Systems  Constitutional: negative for fatigue and weight loss Respiratory: negative for cough and wheezing Cardiovascular: negative for chest pain, fatigue and palpitations Gastrointestinal: negative for abdominal pain and change in bowel habits Musculoskeletal:negative for myalgias Neurological: negative for gait problems and tremors Behavioral/Psych: negative for abusive relationship, depression Endocrine: negative for temperature intolerance    Genitourinary:positive for severe itching of vulva and vagina Integument/breast: negative for breast lump, breast tenderness, nipple discharge and skin lesion(s)    Objective:       BP (!) 157/94   Pulse 96   Ht 5\' 5"  (1.651 m)   Wt 160 lb (72.6 kg)   BMI 26.63 kg/m  General:   alert  Skin:   no rash or abnormalities  Lungs:   clear to auscultation bilaterally  Heart:   regular rate and rhythm, S1, S2 normal, no murmur, click, rub or gallop  Breasts:   normal without suspicious masses, skin or nipple changes or axillary nodes  Abdomen:  normal findings: no organomegaly, soft, non-tender and no hernia  Pelvis:  External genitalia: erythematous and inflamed vulva Urinary system: urethral meatus normal and bladder without fullness, nontender Vaginal: normal without tenderness, induration or masses Cervix: normal appearance Adnexa: normal bimanual exam Uterus: anteverted and non-tender, normal size   Lab Review Urine pregnancy test Labs reviewed yes Radiologic studies reviewed yes  50% of 20 min visit spent on counseling and coordination of care.    Assessment:    Healthy female exam.    Vulvovaginitis - probable Candida infection  Postmenopausal atrophic vaginitis    Plan:   Diflucan / Loprox cream  Rx for dual therapy Climara Pro Patches Rx  Education reviewed: calcium supplements, depression evaluation, low fat, low cholesterol  diet, self breast exams, skin cancer screening and weight bearing exercise. Hormone replacement therapy: hormone replacement therapy: Climara Pro Patches, risks and benefits reviewed, prescription for 12 months and follow up appointment recommended. Follow up in: 2 weeks.   Meds ordered this encounter  Medications  . fluconazole (DIFLUCAN) 200 MG tablet    Sig: Take 1 tablet (200 mg total) by mouth every other day.    Dispense:  3 tablet    Refill:  2  . ciclopirox (LOPROX) 0.77 % cream    Sig: Apply topically 2 (two) times daily.    Dispense:  90 g    Refill:  2  . estradiol-levonorgestrel (CLIMARA PRO) 0.045-0.015 MG/DAY    Sig: Place 1 patch onto the skin once a week.    Dispense:  4 patch  Refill:  12   No orders of the defined types were placed in this encounter.

## 2016-05-24 NOTE — Progress Notes (Signed)
Pt complains of having some vaginal itching.

## 2016-05-25 LAB — CERVICOVAGINAL ANCILLARY ONLY
Bacterial vaginitis: NEGATIVE
CANDIDA VAGINITIS: NEGATIVE

## 2016-05-26 ENCOUNTER — Ambulatory Visit (INDEPENDENT_AMBULATORY_CARE_PROVIDER_SITE_OTHER): Payer: Medicare Other | Admitting: Family Medicine

## 2016-05-26 ENCOUNTER — Encounter: Payer: Self-pay | Admitting: Family Medicine

## 2016-05-26 VITALS — BP 129/70 | HR 78 | Temp 98.8°F | Ht 65.0 in | Wt 160.0 lb

## 2016-05-26 DIAGNOSIS — J209 Acute bronchitis, unspecified: Secondary | ICD-10-CM

## 2016-05-26 LAB — CYTOLOGY - PAP
DIAGNOSIS: NEGATIVE
HPV (WINDOPATH): NOT DETECTED

## 2016-05-26 MED ORDER — AZITHROMYCIN 250 MG PO TABS: ORAL_TABLET | ORAL | 0 refills | Status: DC

## 2016-05-26 NOTE — Progress Notes (Signed)
BP 129/70   Pulse 78   Temp 98.8 F (37.1 C) (Oral)   Ht 5\' 5"  (1.651 m)   Wt 160 lb (72.6 kg)   BMI 26.63 kg/m    Subjective:    Patient ID: Stacy Henry, female    DOB: 05/14/1945, 71 y.o.   MRN: [id number]  HPI: Stacy Henry is a 71 y.o. female presenting on 05/26/2016 for Cough (x 1 week, tried Mucinex and OTC cold medication) and Chest congestion and burning   HPI Cough and chest congestion Patient has been having cough and chest congestion is been going on the past week and seems to be worsening. She has been using Mucinex and over-the-counter cold medication and Allegra and her Flonase and it does not seem to be improving it. She doesn't that she gets allergies this time year but now she feels like it is going down into her chest. She does say about 5 days ago she felt feverish and had chills but did not take her actual temperature. Her cough is been mostly nonproductive. She did have some sneezing initially but now it's mostly the cough and the irritation in her throat and the drainage is going down the back of her throat. She denies any sick contacts that she knows of. She denies any shortness of breath or wheezing.  Relevant past medical, surgical, family and social history reviewed and updated as indicated. Interim medical history since our last visit reviewed. Allergies and medications reviewed and updated.  Review of Systems  Constitutional: Positive for chills and fever.  HENT: Positive for congestion, postnasal drip, rhinorrhea, sinus pressure, sneezing and sore throat. Negative for ear discharge and ear pain.   Eyes: Negative for pain, redness and visual disturbance.  Respiratory: Positive for cough. Negative for chest tightness, shortness of breath and wheezing.   Cardiovascular: Negative for chest pain and leg swelling.  Genitourinary: Negative for difficulty urinating and dysuria.  Musculoskeletal: Negative for back pain and gait problem.  Skin:  Negative for rash.  Neurological: Negative for light-headedness and headaches.  Psychiatric/Behavioral: Negative for agitation and behavioral problems.  All other systems reviewed and are negative.   Per HPI unless specifically indicated above      Objective:    BP 129/70   Pulse 78   Temp 98.8 F (37.1 C) (Oral)   Ht 5\' 5"  (1.651 m)   Wt 160 lb (72.6 kg)   BMI 26.63 kg/m   Wt Readings from Last 3 Encounters:  05/26/16 160 lb (72.6 kg)  05/24/16 160 lb (72.6 kg)  05/10/16 161 lb 4.8 oz (73.2 kg)    Physical Exam  Constitutional: She is oriented to person, place, and time. She appears well-developed and well-nourished. No distress.  HENT:  Right Ear: Tympanic membrane, external ear and ear canal normal.  Left Ear: Tympanic membrane, external ear and ear canal normal.  Nose: Mucosal edema and rhinorrhea present. No epistaxis. Right sinus exhibits no maxillary sinus tenderness and no frontal sinus tenderness. Left sinus exhibits no maxillary sinus tenderness and no frontal sinus tenderness.  Mouth/Throat: Uvula is midline and mucous membranes are normal. Posterior oropharyngeal edema and posterior oropharyngeal erythema present. No oropharyngeal exudate or tonsillar abscesses.  Eyes: Conjunctivae and EOM are normal.  Neck: Neck supple. No thyromegaly present.  Cardiovascular: Normal rate, regular rhythm, normal heart sounds and intact distal pulses.   No murmur heard. Pulmonary/Chest: Effort normal and breath sounds normal. No respiratory distress. She has no wheezes.  Musculoskeletal: Normal  range of motion. She exhibits no edema or tenderness.  Lymphadenopathy:    She has no cervical adenopathy.  Neurological: She is alert and oriented to person, place, and time. Coordination normal.  Skin: Skin is warm and dry. No rash noted. She is not diaphoretic.  Psychiatric: She has a normal mood and affect. Her behavior is normal.  Nursing note and vitals reviewed.     Assessment &  Plan:   Problem List Items Addressed This Visit    None    Visit Diagnoses    Acute bronchitis, unspecified organism    -  Primary   Relevant Medications   azithromycin (ZITHROMAX) 250 MG tablet       Follow up plan: Return if symptoms worsen or fail to improve.  Counseling provided for all of the vaccine components No orders of the defined types were placed in this encounter.   Caryl Pina, MD [facility] 05/26/2016, 11:53 AM

## 2016-06-07 ENCOUNTER — Ambulatory Visit (INDEPENDENT_AMBULATORY_CARE_PROVIDER_SITE_OTHER): Payer: Medicare Other | Admitting: Obstetrics

## 2016-06-07 ENCOUNTER — Other Ambulatory Visit (HOSPITAL_COMMUNITY)
Admission: RE | Admit: 2016-06-07 | Discharge: 2016-06-07 | Disposition: A | Discharge status: Home / Self Care | Payer: Medicare Other | Source: Ambulatory Visit | Attending: Obstetrics | Admitting: Obstetrics

## 2016-06-07 ENCOUNTER — Encounter: Payer: Self-pay | Admitting: Obstetrics

## 2016-06-07 VITALS — BP 123/80 | HR 75 | Wt 160.0 lb

## 2016-06-07 DIAGNOSIS — B373 Candidiasis of vulva and vagina: Secondary | ICD-10-CM | POA: Diagnosis not present

## 2016-06-07 DIAGNOSIS — B3731 Acute candidiasis of vulva and vagina: Secondary | ICD-10-CM

## 2016-06-07 MED ORDER — FLUCONAZOLE 200 MG PO TABS: 200.0000 mg | ORAL_TABLET | ORAL | 0 refills | Status: DC

## 2016-06-07 NOTE — Progress Notes (Signed)
Patient is in the office for follow up- she states she did get better with the pills- but as soon as she finished the pills she feels a slight itch there. Patient is still using the cream.

## 2016-06-07 NOTE — Progress Notes (Signed)
Patient ID: Stacy Henry, female   DOB: 08/29/1945, 71 y.o.   MRN: [id number]  Chief Complaint  Patient presents with  . Gynecologic Exam    HPI Stacy Henry is a 71 y.o. female.  Presents for follow up of recurrent vulvitis.  Had biopsy proven Lichen Sclerosis last fall and was treated with Clobetasol with good response.  Vulva itching reoccurred 2-3 months ago with a beefy red appearance and did not respond to Clobetasol.  The appearance was more like candida vulvitis so Diflucan and local Loprox cream was Rx.  She still did not respond optimally. HPI  Past Medical History:  Diagnosis Date  . Allergy   . GERD (gastroesophageal reflux disease)   . Ischemic colitis ([place]) 2011  . Osteopenia     Past Surgical History:  Procedure Laterality Date  . APPENDECTOMY  as teenager  . BREAST SURGERY Bilateral    "lump"removal  . COLONOSCOPY  2006   RMR: normal.  . COLONOSCOPY  2012   [facility]: most c/w ischemic colitis, no path  . COLONOSCOPY N/A 12/03/2014   Procedure: COLONOSCOPY;  Surgeon: Daneil Dolin, MD;  Location: [facility];  Service: Endoscopy;  Laterality: N/A;  0900  . EYE SURGERY Bilateral     Family History  Problem Relation Age of Onset  . Alcohol abuse Sister   . Diabetes Brother   . Heart attack Brother 11  . Aneurysm Brother   . Diabetes Brother   . Cirrhosis Brother   . Alcohol abuse Brother   . Hypertension Mother   . Atrial fibrillation Mother   . Macular degeneration Mother   . Heart attack Father   . Kidney disease Sister   . Atrial fibrillation Sister   . Colon cancer Neg Hx     Social History Social History  Substance Use Topics  . Smoking status: Never Smoker  . Smokeless tobacco: Never Used  . Alcohol use Yes     Comment: ocassional wine - less than weekly    Allergies  Allergen Reactions  . Sulfa Antibiotics     Current Outpatient Prescriptions  Medication Sig Dispense Refill  . BIOTIN PO Take 1  tablet by mouth daily.    . fluticasone (FLONASE) 50 MCG/ACT nasal spray Place 2 sprays into both nostrils daily. 17 g 1  . pantoprazole (PROTONIX) 40 MG tablet TAKE 1 TABLET DAILY 90 tablet 3  . pravastatin (PRAVACHOL) 40 MG tablet Take 1 tablet (40 mg total) by mouth at bedtime. 30 tablet 6  . clobetasol cream (TEMOVATE) 2.42 % Apply 1 application topically 2 (two) times daily. (Patient not taking: Reported on 06/07/2016) 60 g 2  . estradiol-levonorgestrel (CLIMARA PRO) 0.045-0.015 MG/DAY Place 1 patch onto the skin once a week. (Patient not taking: Reported on 05/26/2016) 4 patch 12  . fluconazole (DIFLUCAN) 200 MG tablet Take 1 tablet (200 mg total) by mouth every other day. 7 tablet 0  . valACYclovir (VALTREX) 1000 MG tablet 2gm twice a day for 24h for symptoms (Patient not taking: Reported on 06/07/2016) 20 tablet 1   No current facility-administered medications for this visit.     Review of Systems Review of Systems Constitutional: negative for fatigue and weight loss Respiratory: negative for cough and wheezing Cardiovascular: negative for chest pain, fatigue and palpitations Gastrointestinal: negative for abdominal pain and change in bowel habits Genitourinary:positive for pruritic erythematous vulva Integument/breast: negative for nipple discharge Musculoskeletal:negative for myalgias Neurological: negative for gait problems and tremors Behavioral/Psych:  negative for abusive relationship, depression Endocrine: negative for temperature intolerance      Blood pressure 123/80, pulse 75, weight 160 lb (72.6 kg).  Physical Exam Physical Exam General:   alert  Skin:   no rash or abnormalities  Lungs:   clear to auscultation bilaterally  Heart:   regular rate and rhythm, S1, S2 normal, no murmur, click, rub or gallop  Breasts:   normal without suspicious masses, skin or nipple changes or axillary nodes  Abdomen:  normal findings: no organomegaly, soft, non-tender and no hernia   Pelvis:  External genitalia: normal general appearance Urinary system: urethral meatus normal and bladder without fullness, nontender Vaginal: normal without tenderness, induration or masses Cervix: normal appearance Adnexa: normal bimanual exam Uterus: anteverted and non-tender, normal size    50% of 20 min visit spent on counseling and coordination of care.    Data Reviewed Wet prep  Assessment     Severe pruritic erythematous vulvitis    Plan    Will try Dflucan / Loprox again F/U in 3 weeks  Orders Placed This Encounter  Procedures  . Ambulatory referral to Gynecology    Referral Priority:   Routine    Referral Type:   Consultation    Referral Reason:   Specialty Services Required    Requested Specialty:   Gynecology    Number of Visits Requested:   1   Meds ordered this encounter  Medications  . fluconazole (DIFLUCAN) 200 MG tablet    Sig: Take 1 tablet (200 mg total) by mouth every other day.    Dispense:  7 tablet    Refill:  0          Patient ID: Stacy Henry, female   DOB: 02-21-1945, 71 y.o.   MRN: [id number]

## 2016-06-09 LAB — CERVICOVAGINAL ANCILLARY ONLY: CANDIDA VAGINITIS: NEGATIVE

## 2016-06-10 DIAGNOSIS — L900 Lichen sclerosus et atrophicus: Secondary | ICD-10-CM | POA: Diagnosis not present

## 2016-06-28 ENCOUNTER — Ambulatory Visit (INDEPENDENT_AMBULATORY_CARE_PROVIDER_SITE_OTHER): Payer: Medicare Other | Admitting: Obstetrics

## 2016-06-28 ENCOUNTER — Encounter: Payer: Self-pay | Admitting: Obstetrics

## 2016-06-28 VITALS — BP 131/75 | HR 80 | Wt 161.0 lb

## 2016-06-28 DIAGNOSIS — L900 Lichen sclerosus et atrophicus: Secondary | ICD-10-CM | POA: Diagnosis not present

## 2016-06-28 DIAGNOSIS — N951 Menopausal and female climacteric states: Secondary | ICD-10-CM | POA: Diagnosis not present

## 2016-06-28 DIAGNOSIS — N952 Postmenopausal atrophic vaginitis: Secondary | ICD-10-CM | POA: Diagnosis not present

## 2016-06-28 MED ORDER — ESTRADIOL-LEVONORGESTREL 0.045-0.015 MG/DAY TD PTWK: 1.0000 | MEDICATED_PATCH | TRANSDERMAL | 12 refills | Status: DC

## 2016-06-28 MED ORDER — FLUCONAZOLE 200 MG PO TABS: 200.0000 mg | ORAL_TABLET | ORAL | 0 refills | Status: DC

## 2016-06-28 NOTE — Progress Notes (Signed)
Patient ID: Stacy Henry, female   DOB: 21-Apr-1945, 71 y.o.   MRN: [id number]  Chief Complaint  Patient presents with  . Follow-up    vaginitis    HPI Stacy Henry is a 71 y.o. female.  History of recurrent Lichen Sclerosus that was refractory to Clobetasol cream.  Referred to Dermatology.  Patient returns for follow up after being evaluated and treated by Dermatologist.  Symptoms resolving.  Continues to have hot flashes.  Did not use Climara Pro patches that were Rx. HPI  Past Medical History:  Diagnosis Date  . Allergy   . GERD (gastroesophageal reflux disease)   . Ischemic colitis ([place]) 2011  . Osteopenia     Past Surgical History:  Procedure Laterality Date  . APPENDECTOMY  as teenager  . BREAST SURGERY Bilateral    "lump"removal  . COLONOSCOPY  2006   RMR: normal.  . COLONOSCOPY  2012   [facility]: most c/w ischemic colitis, no path  . COLONOSCOPY N/A 12/03/2014   Procedure: COLONOSCOPY;  Surgeon: Daneil Dolin, MD;  [facility];  Service: Endoscopy;  Laterality: N/A;  0900  . EYE SURGERY Bilateral     Family History  Problem Relation Age of Onset  . Alcohol abuse Sister   . Diabetes Brother   . Heart attack Brother 33  . Aneurysm Brother   . Diabetes Brother   . Cirrhosis Brother   . Alcohol abuse Brother   . Hypertension Mother   . Atrial fibrillation Mother   . Macular degeneration Mother   . Heart attack Father   . Kidney disease Sister   . Atrial fibrillation Sister   . Colon cancer Neg Hx     Social History Social History  Substance Use Topics  . Smoking status: Never Smoker  . Smokeless tobacco: Never Used  . Alcohol use Yes     Comment: ocassional wine - less than weekly    Allergies  Allergen Reactions  . Sulfa Antibiotics     Current Outpatient Prescriptions  Medication Sig Dispense Refill  . BIOTIN PO Take 1 tablet by mouth daily.    . clobetasol cream (TEMOVATE) 3.81 % Apply 1 application  topically 2 (two) times daily. (Patient not taking: Reported on 06/07/2016) 60 g 2  . estradiol-levonorgestrel (CLIMARA PRO) 0.045-0.015 MG/DAY Place 1 patch onto the skin once a week. 4 patch 12  . fluconazole (DIFLUCAN) 200 MG tablet Take 1 tablet (200 mg total) by mouth every other day. 7 tablet 0  . fluticasone (FLONASE) 50 MCG/ACT nasal spray Place 2 sprays into both nostrils daily. 17 g 1  . pantoprazole (PROTONIX) 40 MG tablet TAKE 1 TABLET DAILY 90 tablet 3  . pravastatin (PRAVACHOL) 40 MG tablet Take 1 tablet (40 mg total) by mouth at bedtime. 30 tablet 6  . valACYclovir (VALTREX) 1000 MG tablet 2gm twice a day for 24h for symptoms (Patient not taking: Reported on 06/07/2016) 20 tablet 1   No current facility-administered medications for this visit.     Review of Systems Review of Systems Constitutional: negative for fatigue and weight loss Respiratory: negative for cough and wheezing Cardiovascular: negative for chest pain, fatigue and palpitations Gastrointestinal: negative for abdominal pain and change in bowel habits Genitourinary:positive for vulva pruritus Integument/breast: negative for nipple discharge Musculoskeletal:negative for myalgias Neurological: negative for gait problems and tremors Behavioral/Psych: negative for abusive relationship, depression Endocrine: positive for hot flashes   Blood pressure 131/75, pulse 80, weight 161 lb (73  kg).  Physical Exam Physical Exam :  Deferred                                                                                                                                                                                                                    >50% of 10 min visit spent on counseling and coordination of care.    Data Reviewed Labs Meds  Assessment      1. Lichen sclerosus et atrophicus Rx: - Clobetasol 0.05% ointment Rx - Vistaril Rx  2. Postmenopause atrophic vaginitis Rx: - estradiol-levonorgestrel (CLIMARA  PRO) 0.045-0.015 MG/DAY; Place 1 patch onto the skin once a week.  Dispense: 4 patch; Refill: 12  3. Hot flashes due to menopause Rx: - Climara Pro Rx    Plan    Follow up prn  No orders of the defined types were placed in this encounter.  Meds ordered this encounter  Medications  . fluconazole (DIFLUCAN) 200 MG tablet    Sig: Take 1 tablet (200 mg total) by mouth every other day.    Dispense:  7 tablet    Refill:  0  . estradiol-levonorgestrel (CLIMARA PRO) 0.045-0.015 MG/DAY    Sig: Place 1 patch onto the skin once a week.    Dispense:  4 patch    Refill:  12

## 2016-07-14 ENCOUNTER — Other Ambulatory Visit: Payer: Self-pay | Admitting: *Deleted

## 2016-07-14 MED ORDER — PRAVASTATIN SODIUM 40 MG PO TABS: 40.0000 mg | ORAL_TABLET | Freq: Every day | ORAL | 1 refills | Status: DC

## 2016-07-20 DIAGNOSIS — L900 Lichen sclerosus et atrophicus: Secondary | ICD-10-CM | POA: Diagnosis not present

## 2016-08-05 ENCOUNTER — Other Ambulatory Visit: Payer: Self-pay | Admitting: Obstetrics

## 2016-08-22 ENCOUNTER — Ambulatory Visit: Payer: Medicare Other

## 2016-08-22 ENCOUNTER — Other Ambulatory Visit: Payer: Self-pay | Admitting: Pediatrics

## 2016-08-22 DIAGNOSIS — Z780 Asymptomatic menopausal state: Secondary | ICD-10-CM

## 2016-08-26 DIAGNOSIS — L900 Lichen sclerosus et atrophicus: Secondary | ICD-10-CM | POA: Diagnosis not present

## 2016-08-26 DIAGNOSIS — L821 Other seborrheic keratosis: Secondary | ICD-10-CM | POA: Diagnosis not present

## 2016-09-06 ENCOUNTER — Other Ambulatory Visit: Payer: Medicare Other

## 2016-09-21 ENCOUNTER — Ambulatory Visit (INDEPENDENT_AMBULATORY_CARE_PROVIDER_SITE_OTHER): Payer: Medicare Other | Admitting: Family Medicine

## 2016-09-21 ENCOUNTER — Encounter: Payer: Self-pay | Admitting: Family Medicine

## 2016-09-21 VITALS — BP 122/73 | HR 87 | Temp 98.0°F | Ht 65.0 in | Wt 165.0 lb

## 2016-09-21 DIAGNOSIS — H00015 Hordeolum externum left lower eyelid: Secondary | ICD-10-CM

## 2016-09-21 MED ORDER — AMOXICILLIN 875 MG PO TABS: 875.0000 mg | ORAL_TABLET | Freq: Two times a day (BID) | ORAL | 0 refills | Status: DC

## 2016-09-21 NOTE — Progress Notes (Signed)
Chief Complaint  Patient presents with  . Eye Problem    pt here today c/o a bump on the lower lid of her left eye since yesterday and it is itching really bad    HPI  Patient presents today for Onset of itching under the left eye. She noticed a red spot this morning. She's never had a stye but it looks like what her dad used to talk about stye being like. She's had no other eye symptoms such as change in vision redness swelling pain.  PMH: Smoking status noted ROS: Per HPI  Objective: BP 122/73   Pulse 87   Temp 98 F (36.7 C) (Oral)   Ht 5\' 5"  (1.651 m)   Wt 165 lb (74.8 kg)   BMI 27.46 kg/m  Gen: NAD, alert, cooperative with exam HEENT: NCAT, EOMI, PERRL Left lower lid has a typical hordeolum. This measures about 2 mm and does not have a head or a purulent body. It is not fluctuant. Ext: No edema, warm Neuro: Alert and oriented, No gross deficits  Assessment and plan:  1. Hordeolum externum of left lower eyelid     Meds ordered this encounter  Medications  . amoxicillin (AMOXIL) 875 MG tablet    Sig: Take 1 tablet (875 mg total) by mouth 2 (two) times daily.    Dispense:  20 tablet    Refill:  0    No orders of the defined types were placed in this encounter.   Follow up as needed.  Claretta Fraise, MD

## 2016-09-21 NOTE — Patient Instructions (Signed)
Apply warm compress frequently.

## 2016-10-19 ENCOUNTER — Encounter: Payer: Self-pay | Admitting: *Deleted

## 2016-10-19 ENCOUNTER — Ambulatory Visit (INDEPENDENT_AMBULATORY_CARE_PROVIDER_SITE_OTHER): Payer: Medicare Other | Admitting: *Deleted

## 2016-10-19 VITALS — BP 122/68 | HR 73 | Ht 64.75 in | Wt 167.0 lb

## 2016-10-19 DIAGNOSIS — Z0000 Encounter for general adult medical examination without abnormal findings: Secondary | ICD-10-CM | POA: Diagnosis not present

## 2016-10-19 DIAGNOSIS — Z23 Encounter for immunization: Secondary | ICD-10-CM | POA: Diagnosis not present

## 2016-10-19 NOTE — Progress Notes (Signed)
Subjective:   Stacy Henry is a 71 y.o. female who presents for a subsequent Medicare Annual Wellness  Visit. Stacy Henry is retired but is still very active. She lives at home with her husband.   Review of Systems    Health is about the same as last year.   Cardiac Risk Factors include: advanced age (>61men, >88 women);sedentary lifestyle  Other systems negative today.     Objective:    Today's Vitals   10/19/16 1051  BP: 122/68  Pulse: 73  Weight: 167 lb (75.8 kg)  Height: 5' 4.75" (1.645 m)   Body mass index is 28.01 kg/m.   Current Medications (verified) Outpatient Encounter Prescriptions as of 10/19/2016  Medication Sig  . b complex vitamins tablet Take 1 tablet by mouth daily.  Marland Kitchen BIOTIN PO Take 1 tablet by mouth daily.  . clobetasol cream (TEMOVATE) 5.27 % Apply 1 application topically 2 (two) times daily.  . fexofenadine-pseudoephedrine (ALLEGRA-D) 60-120 MG 12 hr tablet Take 1 tablet by mouth 2 (two) times daily.  . fluticasone (FLONASE) 50 MCG/ACT nasal spray Place 2 sprays into both nostrils daily.  . Melatonin 10 MG SUBL Place 10 mg under the tongue.  . Multiple Vitamin (MULTIVITAMIN WITH MINERALS) TABS tablet Take 1 tablet by mouth daily.  . pantoprazole (PROTONIX) 40 MG tablet TAKE 1 TABLET DAILY  . pravastatin (PRAVACHOL) 40 MG tablet Take 1 tablet (40 mg total) by mouth at bedtime.  . valACYclovir (VALTREX) 1000 MG tablet 2gm twice a day for 24h for symptoms  . [DISCONTINUED] amoxicillin (AMOXIL) 875 MG tablet Take 1 tablet (875 mg total) by mouth 2 (two) times daily.  . [DISCONTINUED] estradiol-levonorgestrel (CLIMARA PRO) 0.045-0.015 MG/DAY Place 1 patch onto the skin once a week.   No facility-administered encounter medications on file as of 10/19/2016.     Allergies (verified) Sulfa antibiotics   History: Past Medical History:  Diagnosis Date  . Allergy   . GERD (gastroesophageal reflux disease)   . Insomnia   . Ischemic colitis ([place])  2011  . Osteopenia    Past Surgical History:  Procedure Laterality Date  . APPENDECTOMY  as teenager  . BREAST SURGERY Bilateral    "lump"removal  . COLONOSCOPY  2006   RMR: normal.  . COLONOSCOPY  2012   [facility]: most c/w ischemic colitis, no path  . COLONOSCOPY N/A 12/03/2014   Procedure: COLONOSCOPY;  Surgeon: Daneil Dolin, MD;  [facility];  Service: Endoscopy;  Laterality: N/A;  0900  . EYE SURGERY Bilateral    Family History  Problem Relation Age of Onset  . Alcohol abuse Sister   . Alcoholism Sister   . Diabetes Brother   . Heart attack Brother 65  . Aneurysm Brother   . Alcoholism Brother   . Diabetes Brother   . Cirrhosis Brother   . Alcohol abuse Brother   . Alcoholism Brother   . Hypertension Mother   . Atrial fibrillation Mother   . Macular degeneration Mother   . Heart attack Father   . Kidney disease Sister   . Atrial fibrillation Sister   . Colon cancer Neg Hx    Social History   Occupational History  . Not on file.   Social History Main Topics  . Smoking status: Never Smoker  . Smokeless tobacco: Never Used  . Alcohol use Yes     Comment: ocassional wine - less than weekly  . Drug use: No  . Sexual activity:  Yes    Partners: Male    Tobacco Counseling No tobacco use  Activities of Daily Living In your present state of health, do you have any difficulty performing the following activities: 10/19/2016 10/20/2015  Hearing? N Y  Comment - hard to hear TV  Vision? N N  Difficulty concentrating or making decisions? N N  Walking or climbing stairs? N N  Dressing or bathing? N N  Doing errands, shopping? N N  Preparing Food and eating ? N N  Using the Toilet? N N  In the past six months, have you accidently leaked urine? N N  Do you have problems with loss of bowel control? N N  Managing your Medications? N N  Managing your Finances? N N  Housekeeping or managing your Housekeeping? N N  Some recent data  might be hidden    Immunizations and Health Maintenance Immunization History  Administered Date(s) Administered  . Influenza, High Dose Seasonal PF 10/19/2016  . Influenza,inj,Quad PF,6+ Mos 10/25/2012, 12/10/2014, 10/20/2015  . Pneumococcal Conjugate-13 12/10/2014  . Pneumococcal Polysaccharide-23 04/21/2009  . Td 01/03/2009  . Zoster 04/21/2009   Health Maintenance Due  Topic Date Due  . DEXA SCAN  04/23/2015  . INFLUENZA VACCINE  08/03/2016    Patient Care Team: Eustaquio Maize, MD as PCP - General (Internal Medicine) Druscilla Brownie, MD as Consulting Physician (Dermatology) Gala Romney Cristopher Estimable, MD as Consulting Physician (Gastroenterology)  No hospitalizations, ER visits or surgeries this year.      Assessment:   This is a routine wellness examination for Stacy Henry.   Hearing/Vision screen No deficits noted during visit. Eye exam is not up to date.   Dietary issues and exercise activities discussed: Current Exercise Habits: The patient does not participate in regular exercise at present, Exercise limited by: None identified  Diet: 2 meals a day. Typically breakfast and an early supper. Admits that they are not very nutritious meals.   Goals    . Exercise 150 minutes per week (moderate activity)    . Plan meals          Plan nutritious meals with lean proteins, fruits, and vegetables. A good midday snack would be a protein and a carbohydrate like a handful of nuts and a piece of fruit.       Depression Screen PHQ 2/9 Scores 09/21/2016 05/26/2016 05/10/2016 01/15/2016 11/30/2015 10/20/2015 02/26/2015  PHQ - 2 Score 0 0 0 0 0 0 0  PHQ- 9 Score - - - - - - -    Fall Risk Fall Risk  09/21/2016 05/26/2016 05/10/2016 01/15/2016 11/30/2015  Falls in the past year? No No No No No    Cognitive Function: MMSE - Mini Mental State Exam 10/19/2016 10/20/2015 12/10/2014  Orientation to time 5 5 5   Orientation to Place 5 5 5   Registration 3 3 3   Attention/ Calculation 3 5 5     Recall 3 3 3   Language- name 2 objects 2 2 2   Language- repeat 1 1 1   Language- follow 3 step command 3 3 3   Language- read & follow direction 1 1 1   Write a sentence 1 1 1   Copy design 1 1 1   Total score 28 30 30         Screening Tests Health Maintenance  Topic Date Due  . DEXA SCAN  04/23/2015  . INFLUENZA VACCINE  08/03/2016  . MAMMOGRAM  09/09/2017  . TETANUS/TDAP  01/04/2019  . COLONOSCOPY  12/02/2024  . Hepatitis C Screening  Completed  . PNA vac Low Risk Adult  Completed      Plan:   CPE scheduled with Dr Evette Doffing Dexa scan at CPE visit Flu shot given today [facility] Increase physical activity with a goal of at least 150 min of moderate activity weekly Plan meals- Eat more lean proteins, fruits, and vegetables. Try to eat midday as well.  A good snack is a combination of protein and carbohydrate like a handful of nuts or cheese and a piece of fruit.   I have personally reviewed and noted the following in the patient's chart:   . Medical and social history . Use of alcohol, tobacco or illicit drugs  . Current medications and supplements . Functional ability and status . Nutritional status . Physical activity . Advanced directives . List of other physicians . Hospitalizations, surgeries, and ER visits in previous 12 months . Vitals . Screenings to include cognitive, depression, and falls . Referrals and appointments  In addition, I have reviewed and discussed with patient certain preventive protocols, quality metrics, and best practice recommendations. A written personalized care plan for preventive services as well as general preventive health recommendations were provided to patient.     Chong Sicilian, RN  10/19/2016    I have reviewed and agree with the above AWV documentation.   Assunta Found, MD [place]

## 2016-10-19 NOTE — Patient Instructions (Addendum)
  Stacy Henry , Thank you for taking time to come for your Medicare Wellness Visit. I appreciate your ongoing commitment to your health goals. Please review the following plan we discussed and let me know if I can assist you in the future.   These are the goals we discussed: Goals    . Exercise 150 minutes per week (moderate activity)    . Plan meals          Plan nutritious meals with lean proteins, fruits, and vegetables. A good midday snack would be a protein and a carbohydrate like a handful of nuts and a piece of fruit.        This is a list of the screening recommended for you and due dates:  Health Maintenance  Topic Date Due  . DEXA scan (bone density measurement)  04/23/2015  . Flu Shot  08/03/2016  . Mammogram  09/09/2017  . Tetanus Vaccine  01/04/2019  . Colon Cancer Screening  12/02/2024  .  Hepatitis C: One time screening is recommended by Center for Disease Control  (CDC) for  adults born from [birth year] through 1966.   Completed  . Pneumonia vaccines  Completed

## 2016-11-01 ENCOUNTER — Encounter (HOSPITAL_COMMUNITY): Payer: Self-pay | Admitting: *Deleted

## 2016-11-01 ENCOUNTER — Emergency Department (HOSPITAL_COMMUNITY)
Admission: EM | Admit: 2016-11-01 | Discharge: 2016-11-02 | Disposition: A | Discharge status: Home / Self Care | Payer: Medicare Other | Attending: Emergency Medicine | Admitting: Emergency Medicine

## 2016-11-01 DIAGNOSIS — I10 Essential (primary) hypertension: Secondary | ICD-10-CM | POA: Diagnosis not present

## 2016-11-01 DIAGNOSIS — R040 Epistaxis: Secondary | ICD-10-CM

## 2016-11-01 DIAGNOSIS — Z79899 Other long term (current) drug therapy: Secondary | ICD-10-CM | POA: Diagnosis not present

## 2016-11-01 MED ORDER — OXYMETAZOLINE HCL 0.05 % NA SOLN
NASAL | Status: AC
  Filled 2016-11-01: qty 15

## 2016-11-01 MED ORDER — OXYMETAZOLINE HCL 0.05 % NA SOLN
1.0000 | Freq: Once | NASAL | Status: AC
  Administered 2016-11-02: 1 via NASAL

## 2016-11-01 NOTE — ED Triage Notes (Signed)
Per EMS, the pt went to the bathroom to brush her teeth and she blew her nose. Pt blew out of her right nare and it was clear. Pt then blew out of her left nare and a blood clot came out and her nose began to bleed out of both nares. Pt reported feeling her heart beat in her head.

## 2016-11-02 NOTE — ED Provider Notes (Signed)
[facility] EMERGENCY DEPARTMENT Provider Note   CSN: [id number] Arrival date & time: 11/01/16  2344  Time seen 23:48 PM   History   Chief Complaint Chief Complaint  Patient presents with  . Epistaxis    HPI Stacy Henry is a 71 y.o. female.  HPI patient reports she has lots of sinus problems, she states she is currently taking Allegra-D and she uses Flonase nasal spray 2-3 times a week.  She has not had it today however.  She reports lots of sneezing and has bursts of sneezing episodes.  Tonight about 10 PM she was blowing her nose and she started having left-sided epistaxis and then it became bilateral and then she was having blood come out of her mouth.  She states she was having a hard time getting the bleeding stopped, EMS was called.  She states about a week ago she had a similar episode however it stopped in about 5 minutes.  Other than that she has no prior history of nosebleeds.  She denies being on any type of blood thinners including aspirin.  She has not had any alcohol tonight.  She states normally her blood pressure is low and it on arrival.  PCP [facility] in [place]  Past Medical History:  Diagnosis Date  . Allergy   . GERD (gastroesophageal reflux disease)   . Insomnia   . Ischemic colitis ([place]) 2011  . Osteopenia     Patient Active Problem List   Diagnosis Date Noted  . Ischemic colitis ([place]) 11/04/2014    Class: Hospitalized for  . Diarrhea 10/09/2014  . SINUSITIS - ACUTE-NOS 05/13/2009  . POSTMENOPAUSAL SYNDROME 04/21/2009  . ALLERGIC RHINITIS 07/02/2007  . GERD 07/02/2007  . CONSTIPATION 07/02/2007    Past Surgical History:  Procedure Laterality Date  . APPENDECTOMY  as teenager  . BREAST SURGERY Bilateral    "lump"removal  . COLONOSCOPY  2006   RMR: normal.  . COLONOSCOPY  2012   [facility]: most c/w ischemic colitis, no path  . COLONOSCOPY N/A 12/03/2014   Procedure: COLONOSCOPY;  Surgeon: Daneil Dolin,  MD;  Location[facility];  Service: Endoscopy;  Laterality: N/A;  0900  . EYE SURGERY Bilateral     OB History    Gravida Para Term Preterm AB Living   1 1           SAB TAB Ectopic Multiple Live Births           1       Home Medications    Prior to Admission medications   Medication Sig Start Date End Date Taking? Authorizing Provider  b complex vitamins tablet Take 1 tablet by mouth daily.    [provider]  BIOTIN PO Take 1 tablet by mouth daily.    [provider]  clobetasol cream (TEMOVATE) 5.32 % Apply 1 application topically 2 (two) times daily. 09/24/15   Shelly Bombard, MD  fexofenadine-pseudoephedrine (ALLEGRA-D) 60-120 MG 12 hr tablet Take 1 tablet by mouth 2 (two) times daily.    [provider]  fluticasone (FLONASE) 50 MCG/ACT nasal spray Place 2 sprays into both nostrils daily. 05/10/16   Eustaquio Maize, MD  Melatonin 10 MG SUBL Place 10 mg under the tongue.    [provider]  Multiple Vitamin (MULTIVITAMIN WITH MINERALS) TABS tablet Take 1 tablet by mouth daily.    [provider]  pantoprazole (PROTONIX) 40 MG tablet TAKE 1 TABLET DAILY 01/30/16  Hawks, Christy A, FNP  pravastatin (PRAVACHOL) 40 MG tablet Take 1 tablet (40 mg total) by mouth at bedtime. 07/14/16   Dettinger, Fransisca Kaufmann, MD  valACYclovir (VALTREX) 1000 MG tablet 2gm twice a day for 24h for symptoms 05/10/16   Eustaquio Maize, MD    Family History Family History  Problem Relation Age of Onset  . Alcohol abuse Sister   . Alcoholism Sister   . Diabetes Brother   . Heart attack Brother 28  . Aneurysm Brother   . Alcoholism Brother   . Diabetes Brother   . Cirrhosis Brother   . Alcohol abuse Brother   . Alcoholism Brother   . Hypertension Mother   . Atrial fibrillation Mother   . Macular degeneration Mother   . Heart attack Father   . Kidney disease Sister   . Atrial fibrillation Sister   . Colon cancer Neg Hx     Social History Social  History  Substance Use Topics  . Smoking status: Never Smoker  . Smokeless tobacco: Never Used  . Alcohol use Yes     Comment: ocassional wine - less than weekly  lives at home Lives with spouse   Allergies   Sulfa antibiotics   Review of Systems Review of Systems  All other systems reviewed and are negative.    Physical Exam Updated Vital Signs BP (!) 182/83   Pulse 84   Resp 18   Ht 5' 4.75" (1.645 m)   Wt 75.8 kg (167 lb)   SpO2 98%   BMI 28.01 kg/m   Vital signs normal except hypertension   Physical Exam  Constitutional: She is oriented to person, place, and time. She appears well-developed and well-nourished.  Non-toxic appearance. She does not appear ill. No distress.  HENT:  Head: Normocephalic and atraumatic.  Right Ear: External ear normal.  Left Ear: External ear normal.  Nose: Nose normal. No mucosal edema or rhinorrhea.  Mouth/Throat: Oropharynx is clear and moist and mucous membranes are normal. No dental abscesses or uvula swelling.  When I inspect her nares there is some blood in both nares.  There appears to be some irregularity in the left anterior septum.  I will reexamine it after she has the Afrin pledget in place for a while.  Currently there is no active bleeding, patient is not spitting up any blood at this point.  Eyes: Conjunctivae and EOM are normal.  Neck: Normal range of motion and full passive range of motion without pain.  Cardiovascular: Normal rate.   Pulmonary/Chest: Effort normal. No respiratory distress. She has no rhonchi. She exhibits no crepitus.  Abdominal: Normal appearance.  Musculoskeletal: Normal range of motion. She exhibits no deformity.  Moves all extremities well.   Neurological: She is alert and oriented to person, place, and time. She has normal strength. No cranial nerve deficit.  Skin: Skin is warm, dry and intact. No rash noted. No erythema. No pallor.  Psychiatric: She has a normal mood and affect. Her speech is  normal and behavior is normal. Her mood appears not anxious.  Nursing note and vitals reviewed.    ED Treatments / Results  Labs (all labs ordered are listed, but only abnormal results are displayed) Labs Reviewed - No data to display  EKG  EKG Interpretation None       Radiology No results found.  Procedures Procedures (including critical care time)  Medications Ordered in ED Medications  oxymetazoline (AFRIN) 0.05 % nasal spray 1 spray (1 spray  Left Nare Given 11/02/16 0002)     Initial Impression / Assessment and Plan / ED Course  I have reviewed the triage vital signs and the nursing notes.  Pertinent labs & imaging results that were available during my care of the patient were reviewed by me and considered in my medical decision making (see chart for details).    Patient had a Afrin pledget placed in her left nostril.  Her initial blood pressures were high.  1:10 AM I removed the patient's Afrin pledget and reinspected her nose.  I do not see any obvious bleeding site.  The bleeding has stopped.  When I was in the room her blood pressure is 160/86.  I am going to have nursing staff ambulate patient to see if she starts to rebleed.  We discussed that the most likely etiology of her bleeding is dryness of the nose.  She should stop her antihistamines and start doing things in her house to keep her nose moist such as using a vaporizer, using Vaseline in her nasal septum at night.  We discussed if she starts to bleed again to use another Afrin pledget and I instructed her on how to compress her nose to stop the bleeding.  Final Clinical Impressions(s) / ED Diagnoses   Final diagnoses:  Left-sided epistaxis  Hypertension, unspecified type    Plan discharge  Rolland Porter, MD, Barbette Or, MD 11/02/16 [phone]

## 2016-11-02 NOTE — ED Notes (Signed)
Pt ambulated without difficulty and her nose did not start bleeding again.

## 2016-11-02 NOTE — Discharge Instructions (Signed)
Please stop the Allegra-D, it is most likely contributing to your nosebleed and also may be contributing to your high blood pressure.  Get a vaporizer and put in your bedroom about 30 minutes before bedtime.  You can use Vaseline on the nasal septum before you go to bed to keep it moist.  Please have your doctor's office recheck your blood pressure this week.  If your nosebleeds again sprayed Afrin on a cotton ball and place it in your left nostril and hold pressure to your nose constantly for 5-10 minutes as we discussed.  If the bleeding is not controlled return to the emergency department.

## 2016-11-14 ENCOUNTER — Encounter: Payer: Self-pay | Admitting: Pediatrics

## 2016-11-14 ENCOUNTER — Ambulatory Visit (INDEPENDENT_AMBULATORY_CARE_PROVIDER_SITE_OTHER): Payer: Medicare Other | Admitting: Pediatrics

## 2016-11-14 VITALS — BP 161/92 | HR 79 | Temp 97.1°F | Ht 64.75 in | Wt 167.0 lb

## 2016-11-14 DIAGNOSIS — E785 Hyperlipidemia, unspecified: Secondary | ICD-10-CM

## 2016-11-14 DIAGNOSIS — Z0001 Encounter for general adult medical examination with abnormal findings: Secondary | ICD-10-CM | POA: Diagnosis not present

## 2016-11-14 DIAGNOSIS — I10 Essential (primary) hypertension: Secondary | ICD-10-CM | POA: Diagnosis not present

## 2016-11-14 DIAGNOSIS — M8580 Other specified disorders of bone density and structure, unspecified site: Secondary | ICD-10-CM

## 2016-11-14 DIAGNOSIS — Z0000 Encounter for general adult medical examination without abnormal findings: Secondary | ICD-10-CM

## 2016-11-14 MED ORDER — PRAVASTATIN SODIUM 40 MG PO TABS: 40.0000 mg | ORAL_TABLET | Freq: Every day | ORAL | 1 refills | Status: DC

## 2016-11-14 MED ORDER — LOSARTAN POTASSIUM 50 MG PO TABS: 50.0000 mg | ORAL_TABLET | Freq: Every day | ORAL | 3 refills | Status: DC

## 2016-11-14 NOTE — Progress Notes (Signed)
  Subjective:   Patient ID: Stacy Henry, female    DOB: 1945/02/15, 71 y.o.   MRN: [id number] CC: Annual, blood pressure problems HPI: Stacy Henry is a 71 y.o. female presenting for  Annual  HTN: no CP or pressure Has HA intermittently, doesn't check her BP with it Takes tylenol and improves Has had 4 nosebleeds in the last 3 weeks L side usually bleeds Was seen in ED 2 weeks ago for nose bleed from both sides  Overall well Has been cooking more since retirement Trying to get back to walking more often and regularly  Osteopenia: Taking vitamin D and calcium  HLD: Taking pravastatin regularly, no side effects  Relevant past medical, surgical, family and social history reviewed. Allergies and medications reviewed and updated. Social History   Tobacco Use  Smoking Status Never Smoker  Smokeless Tobacco Never Used   ROS: All systems negative other than what is in HPI  Objective:    BP (!) 161/92   Pulse 79   Temp (!) 97.1 F (36.2 C) (Oral)   Ht 5' 4.75" (1.645 m)   Wt 167 lb (75.8 kg)   BMI 28.01 kg/m   Wt Readings from Last 3 Encounters:  11/14/16 167 lb (75.8 kg)  11/01/16 167 lb (75.8 kg)  10/19/16 167 lb (75.8 kg)    Gen: NAD, alert, cooperative with exam, NCAT EYES: EOMI, no conjunctival injection, or no icterus ENT:  TMs pearly gray b/l, OP without erythema, left nare septum with superficial nonbleeding excoriation LYMPH: no cervical LAD CV: NRRR, normal S1/S2, no murmur, distal pulses 2+ b/l Resp: CTABL, no wheezes, normal WOB Abd: +BS, soft, NTND. no guarding or organomegaly Ext: No edema, warm Neuro: Alert and oriented, strength equal b/l UE and LE, coordination grossly normal MSK: normal muscle bulk  Declines breast exam  Assessment & Plan:   71 year old here today for annual and elevated blood pressure  Diagnoses and all orders for this visit:  Encounter for preventive health examination Colonoscopy up-to-date Mammogram  up-to-date Continue lifestyle changes including walking regularly, minimizing sugar intake  Essential hypertension  new problem  + Headaches and nosebleeds Start below Recheck 4 weeks -     Basic Metabolic Panel -     losartan (COZAAR) 50 MG tablet; Take 1 tablet (50 mg total) daily by mouth.  Osteopenia, unspecified location -     DG [facility] DEXA  Hyperlipidemia, unspecified hyperlipidemia type Stable, continue below -     pravastatin (PRAVACHOL) 40 MG tablet; Take 1 tablet (40 mg total) at bedtime by mouth.   Follow up plan: Return in about 4 weeks (around 12/12/2016). Assunta Found, MD [place]

## 2016-11-15 LAB — BASIC METABOLIC PANEL
BUN/Creatinine Ratio: 21 (ref 12–28)
BUN: 20 mg/dL (ref 8–27)
CALCIUM: 10 mg/dL (ref 8.7–10.3)
CHLORIDE: 101 mmol/L (ref 96–106)
CO2: 24 mmol/L (ref 20–29)
CREATININE: 0.95 mg/dL (ref 0.57–1.00)
GFR calc Af Amer: 69 mL/min/{1.73_m2} (ref >59)
GFR calc non Af Amer: 60 mL/min/{1.73_m2} (ref >59)
GLUCOSE: 80 mg/dL (ref 65–99)
Potassium: 4.4 mmol/L (ref 3.5–5.2)
Sodium: 145 mmol/L — ABNORMAL HIGH (ref 134–144)

## 2016-11-16 ENCOUNTER — Encounter: Payer: Medicare Other | Admitting: Pediatrics

## 2016-11-17 ENCOUNTER — Telehealth: Payer: Self-pay | Admitting: Pediatrics

## 2016-11-17 DIAGNOSIS — I10 Essential (primary) hypertension: Secondary | ICD-10-CM

## 2016-11-17 DIAGNOSIS — R040 Epistaxis: Secondary | ICD-10-CM

## 2016-11-17 MED ORDER — LOSARTAN POTASSIUM 50 MG PO TABS
100.0000 mg | ORAL_TABLET | Freq: Every day | ORAL | 3 refills | Status: DC
Start: 2016-11-17 — End: 2017-01-09

## 2016-11-17 NOTE — Telephone Encounter (Addendum)
D/w pt, take 100mg  losartan daily in the morning, take 50mg  now, last took losartan last night. No chest pain. BP 160/100 this morning. Will call back with BP in a couple hours.

## 2016-11-17 NOTE — Telephone Encounter (Signed)
Patient called back and stated that BP have been elevated in the 160s over high 90s and low 100s.  Pulse has also been elevated in the Mid-high 90s- low 100s.  Patient states that she has had continuous headaches since nose bleeds started.  No changes to BP since starting losartan

## 2016-11-17 NOTE — Addendum Note (Signed)
Addended by: Eustaquio Maize on: 11/17/2016 02:01 PM   Modules accepted: Orders

## 2016-11-18 NOTE — Telephone Encounter (Signed)
She was not home - her husband read me her BP readings from today  167/103 last night 149/90 this AM after meds

## 2016-11-18 NOTE — Telephone Encounter (Signed)
Are her BPs any better today?

## 2016-11-28 DIAGNOSIS — R040 Epistaxis: Secondary | ICD-10-CM | POA: Diagnosis not present

## 2016-12-12 ENCOUNTER — Ambulatory Visit: Payer: Medicare Other | Admitting: Pediatrics

## 2016-12-12 ENCOUNTER — Other Ambulatory Visit: Payer: Medicare Other

## 2016-12-22 DIAGNOSIS — R040 Epistaxis: Secondary | ICD-10-CM | POA: Diagnosis not present

## 2016-12-23 ENCOUNTER — Telehealth: Payer: Self-pay | Admitting: Pediatrics

## 2016-12-23 NOTE — Telephone Encounter (Signed)
Pt instructed to contact pharmacy for recall information

## 2017-01-09 ENCOUNTER — Encounter: Payer: Self-pay | Admitting: Pediatrics

## 2017-01-09 ENCOUNTER — Ambulatory Visit (INDEPENDENT_AMBULATORY_CARE_PROVIDER_SITE_OTHER): Payer: Medicare Other

## 2017-01-09 ENCOUNTER — Ambulatory Visit (INDEPENDENT_AMBULATORY_CARE_PROVIDER_SITE_OTHER): Payer: Medicare Other | Admitting: Pediatrics

## 2017-01-09 VITALS — BP 143/89 | HR 78 | Temp 98.3°F | Ht 64.75 in | Wt 169.8 lb

## 2017-01-09 DIAGNOSIS — I10 Essential (primary) hypertension: Secondary | ICD-10-CM | POA: Diagnosis not present

## 2017-01-09 DIAGNOSIS — E785 Hyperlipidemia, unspecified: Secondary | ICD-10-CM

## 2017-01-09 DIAGNOSIS — G4700 Insomnia, unspecified: Secondary | ICD-10-CM | POA: Diagnosis not present

## 2017-01-09 DIAGNOSIS — M8588 Other specified disorders of bone density and structure, other site: Secondary | ICD-10-CM

## 2017-01-09 DIAGNOSIS — M85851 Other specified disorders of bone density and structure, right thigh: Secondary | ICD-10-CM | POA: Diagnosis not present

## 2017-01-09 LAB — BASIC METABOLIC PANEL
BUN/Creatinine Ratio: 26 (ref 12–28)
BUN: 26 mg/dL (ref 8–27)
CALCIUM: 10.2 mg/dL (ref 8.7–10.3)
CO2: 25 mmol/L (ref 20–29)
CREATININE: 0.99 mg/dL (ref 0.57–1.00)
Chloride: 104 mmol/L (ref 96–106)
GFR, EST AFRICAN AMERICAN: 66 mL/min/{1.73_m2} (ref >59)
GFR, EST NON AFRICAN AMERICAN: 57 mL/min/{1.73_m2} — AB (ref >59)
Glucose: 97 mg/dL (ref 65–99)
Potassium: 4.1 mmol/L (ref 3.5–5.2)
Sodium: 145 mmol/L — ABNORMAL HIGH (ref 134–144)

## 2017-01-09 MED ORDER — LOSARTAN POTASSIUM 100 MG PO TABS: 100.0000 mg | ORAL_TABLET | Freq: Every day | ORAL | 1 refills | Status: DC

## 2017-01-09 MED ORDER — AMLODIPINE BESYLATE 5 MG PO TABS: 5.0000 mg | ORAL_TABLET | Freq: Every day | ORAL | 3 refills | Status: DC

## 2017-01-09 NOTE — Progress Notes (Signed)
  Subjective:   Patient ID: Stacy Henry, female    DOB: July 17, 1945, 72 y.o.   MRN: [id number] CC: Blood Pressure Check  HPI: Stacy Henry is a 72 y.o. female presenting for Blood Pressure Check  Bed time 11-12a, falls asleep around 4am, one cup of coffee in the morning, no EtOH, no naps  HTN: BPs at home 150s-160s, lowest 130s/ 80s-100s Having some HA off and on Had artery cauterized in her nose, no nose bleeds since then  HLD: taking cholesterol med regularly  Depressed mood: Does not feel like doing the things that she usually feels like doing in the wintertime Feels "blah"  Not sleeping well at night Thinks her mood has been down Energy levels down  Elevated BMI: avoids sugary beverages, eats sweet foods for breakfast  Depression screen [facility] 2/9 01/09/2017 09/21/2016 05/26/2016 05/10/2016 01/15/2016  Decreased Interest 0 0 0 0 0  Down, Depressed, Hopeless 0 0 0 0 0  PHQ - 2 Score 0 0 0 0 0  Altered sleeping - - - - -  Tired, decreased energy - - - - -  Change in appetite - - - - -  Feeling bad or failure about yourself  - - - - -  Trouble concentrating - - - - -  Moving slowly or fidgety/restless - - - - -  Suicidal thoughts - - - - -  PHQ-9 Score - - - - -     Relevant past medical, surgical, family and social history reviewed. Allergies and medications reviewed and updated. Social History   Tobacco Use  Smoking Status Never Smoker  Smokeless Tobacco Never Used   ROS: Per HPI   Objective:    BP (!) 143/89   Pulse 78   Temp 98.3 F (36.8 C) (Oral)   Ht 5' 4.75" (1.645 m)   Wt 169 lb 12.8 oz (77 kg)   BMI 28.47 kg/m   Wt Readings from Last 3 Encounters:  01/09/17 169 lb 12.8 oz (77 kg)  11/14/16 167 lb (75.8 kg)  11/01/16 167 lb (75.8 kg)    Gen: NAD, alert, cooperative with exam, NCAT EYES: EOMI, no conjunctival injection, or no icterus ENT:  OP without erythema LYMPH: no cervical LAD CV: NRRR, normal S1/S2, no murmur, distal pulses 2+  b/l Resp: CTABL, no wheezes, normal WOB Abd: +BS, soft, NTND. no guarding or organomegaly Ext: No edema, warm Neuro: Alert and oriented, strength equal b/l UE and LE, coordination grossly normal MSK: normal muscle bulk  Assessment & Plan:  Stacy Henry was seen today for med problem follow-up  Diagnoses and all orders for this visit:  Hyperlipidemia, unspecified hyperlipidemia type ASCVD 10 yr risk 19.8% with todays BP Cont statin Work on improved BP control per below  Essential hypertension Elevated, uncontrolled Start below continue losartan -     amLODipine (NORVASC) 5 MG tablet; Take 1 tablet (5 mg total) by mouth daily. -     Basic Metabolic Panel  Insomnia, unspecified type Discussed sleep hygiene  Depressed mood Start walking regularly Eat regular fruit/veg heavy meals Declines medication at this time  Elevated BMI As above  Follow up plan: Return in about 4 weeks (around 02/06/2017). Assunta Found, MD [place]

## 2017-01-09 NOTE — Patient Instructions (Signed)
Check blood pressures at home Let me know if regularly > 140 or >90

## 2017-02-10 ENCOUNTER — Other Ambulatory Visit: Payer: Self-pay | Admitting: Family

## 2017-04-06 ENCOUNTER — Ambulatory Visit (INDEPENDENT_AMBULATORY_CARE_PROVIDER_SITE_OTHER): Payer: Medicare Other | Admitting: Pediatrics

## 2017-04-06 ENCOUNTER — Encounter: Payer: Self-pay | Admitting: Pediatrics

## 2017-04-06 VITALS — BP 137/81 | HR 74 | Temp 97.5°F | Ht 64.75 in | Wt 166.2 lb

## 2017-04-06 DIAGNOSIS — I10 Essential (primary) hypertension: Secondary | ICD-10-CM

## 2017-04-06 DIAGNOSIS — R0602 Shortness of breath: Secondary | ICD-10-CM | POA: Diagnosis not present

## 2017-04-06 DIAGNOSIS — M7989 Other specified soft tissue disorders: Secondary | ICD-10-CM

## 2017-04-06 NOTE — Patient Instructions (Addendum)
Check BPs regularly at home Bring log and cuff to next appt DASH Eating Plan DASH stands for "Dietary Approaches to Stop Hypertension." The DASH eating plan is a healthy eating plan that has been shown to reduce high blood pressure (hypertension). It may also reduce your risk for type 2 diabetes, heart disease, and stroke. The DASH eating plan may also help with weight loss. What are tips for following this plan? General guidelines  Avoid eating more than 2,300 mg (milligrams) of salt (sodium) a day. If you have hypertension, you may need to reduce your sodium intake to 1,500 mg a day.  Limit alcohol intake to no more than 1 drink a day for nonpregnant women and 2 drinks a day for men. One drink equals 12 oz of beer, 5 oz of wine, or 1 oz of hard liquor.  Work with your health care provider to maintain a healthy body weight or to lose weight. Ask what an ideal weight is for you.  Get at least 30 minutes of exercise that causes your heart to beat faster (aerobic exercise) most days of the week. Activities may include walking, swimming, or biking.  Work with your health care provider or diet and nutrition specialist (dietitian) to adjust your eating plan to your individual calorie needs. Reading food labels  Check food labels for the amount of sodium per serving. Choose foods with less than 5 percent of the Daily Value of sodium. Generally, foods with less than 300 mg of sodium per serving fit into this eating plan.  To find whole grains, look for the word "whole" as the first word in the ingredient list. Shopping  Buy products labeled as "low-sodium" or "no salt added."  Buy fresh foods. Avoid canned foods and premade or frozen meals. Cooking  Avoid adding salt when cooking. Use salt-free seasonings or herbs instead of table salt or sea salt. Check with your health care provider or pharmacist before using salt substitutes.  Do not fry foods. Cook foods using healthy methods such as  baking, boiling, grilling, and broiling instead.  Cook with heart-healthy oils, such as olive, canola, soybean, or sunflower oil. Meal planning   Eat a balanced diet that includes: ? 5 or more servings of fruits and vegetables each day. At each meal, try to fill half of your plate with fruits and vegetables. ? Up to 6-8 servings of whole grains each day. ? Less than 6 oz of lean meat, poultry, or fish each day. A 3-oz serving of meat is about the same size as a deck of cards. One egg equals 1 oz. ? 2 servings of low-fat dairy each day. ? A serving of nuts, seeds, or beans 5 times each week. ? Heart-healthy fats. Healthy fats called Omega-3 fatty acids are found in foods such as flaxseeds and coldwater fish, like sardines, salmon, and mackerel.  Limit how much you eat of the following: ? Canned or prepackaged foods. ? Food that is high in trans fat, such as fried foods. ? Food that is high in saturated fat, such as fatty meat. ? Sweets, desserts, sugary drinks, and other foods with added sugar. ? Full-fat dairy products.  Do not salt foods before eating.  Try to eat at least 2 vegetarian meals each week.  Eat more home-cooked food and less restaurant, buffet, and fast food.  When eating at a restaurant, ask that your food be prepared with less salt or no salt, if possible. What foods are recommended? The items listed  may not be a complete list. Talk with your dietitian about what dietary choices are best for you. Grains Whole-grain or whole-wheat bread. Whole-grain or whole-wheat pasta. Evenson rice. Modena Morrow. Bulgur. Whole-grain and low-sodium cereals. Pita bread. Low-fat, low-sodium crackers. Whole-wheat flour tortillas. Vegetables Fresh or frozen vegetables (raw, steamed, roasted, or grilled). Low-sodium or reduced-sodium tomato and vegetable juice. Low-sodium or reduced-sodium tomato sauce and tomato paste. Low-sodium or reduced-sodium canned vegetables. Fruits All fresh,  dried, or frozen fruit. Canned fruit in natural juice (without added sugar). Meat and other protein foods Skinless chicken or [place]. Ground chicken or [place]. Pork with fat trimmed off. Fish and seafood. Egg whites. Dried beans, peas, or lentils. Unsalted nuts, nut butters, and seeds. Unsalted canned beans. Lean cuts of beef with fat trimmed off. Low-sodium, lean deli meat. Dairy Low-fat (1%) or fat-free (skim) milk. Fat-free, low-fat, or reduced-fat cheeses. Nonfat, low-sodium ricotta or cottage cheese. Low-fat or nonfat yogurt. Low-fat, low-sodium cheese. Fats and oils Soft margarine without trans fats. Vegetable oil. Low-fat, reduced-fat, or light mayonnaise and salad dressings (reduced-sodium). Canola, safflower, olive, soybean, and sunflower oils. Avocado. Seasoning and other foods Herbs. Spices. Seasoning mixes without salt. Unsalted popcorn and pretzels. Fat-free sweets. What foods are not recommended? The items listed may not be a complete list. Talk with your dietitian about what dietary choices are best for you. Grains Baked goods made with fat, such as croissants, muffins, or some breads. Dry pasta or rice meal packs. Vegetables Creamed or fried vegetables. Vegetables in a cheese sauce. Regular canned vegetables (not low-sodium or reduced-sodium). Regular canned tomato sauce and paste (not low-sodium or reduced-sodium). Regular tomato and vegetable juice (not low-sodium or reduced-sodium). Angie Fava. Olives. Fruits Canned fruit in a light or heavy syrup. Fried fruit. Fruit in cream or butter sauce. Meat and other protein foods Fatty cuts of meat. Ribs. Fried meat. Berniece Salines. Sausage. Bologna and other processed lunch meats. Salami. Fatback. Hotdogs. Bratwurst. Salted nuts and seeds. Canned beans with added salt. Canned or smoked fish. Whole eggs or egg yolks. Chicken or [place] with skin. Dairy Whole or 2% milk, cream, and half-and-half. Whole or full-fat cream cheese. Whole-fat or sweetened  yogurt. Full-fat cheese. Nondairy creamers. Whipped toppings. Processed cheese and cheese spreads. Fats and oils Butter. Stick margarine. Lard. Shortening. Ghee. Bacon fat. Tropical oils, such as coconut, palm kernel, or palm oil. Seasoning and other foods Salted popcorn and pretzels. Onion salt, garlic salt, seasoned salt, table salt, and sea salt. Worcestershire sauce. Tartar sauce. Barbecue sauce. Teriyaki sauce. Soy sauce, including reduced-sodium. Steak sauce. Canned and packaged gravies. Fish sauce. Oyster sauce. Cocktail sauce. Horseradish that you find on the shelf. Ketchup. Mustard. Meat flavorings and tenderizers. Bouillon cubes. Hot sauce and Tabasco sauce. Premade or packaged marinades. Premade or packaged taco seasonings. Relishes. Regular salad dressings. Where to find more information:  National Heart, Lung, and [place]: https://wilson-eaton.com/  American Heart Association: www.heart.org Summary  The DASH eating plan is a healthy eating plan that has been shown to reduce high blood pressure (hypertension). It may also reduce your risk for type 2 diabetes, heart disease, and stroke.  With the DASH eating plan, you should limit salt (sodium) intake to 2,300 mg a day. If you have hypertension, you may need to reduce your sodium intake to 1,500 mg a day.  When on the DASH eating plan, aim to eat more fresh fruits and vegetables, whole grains, lean proteins, low-fat dairy, and heart-healthy fats.  Work with your health care provider or diet  and nutrition specialist (dietitian) to adjust your eating plan to your individual calorie needs. This information is not intended to replace advice given to you by your health care provider. Make sure you discuss any questions you have with your health care provider. Document Released: 12/09/2010 Document Revised: 12/14/2015 Document Reviewed: 12/14/2015 Elsevier Interactive Patient Education  [employer].

## 2017-04-06 NOTE — Progress Notes (Signed)
  Subjective:   Patient ID: Stacy Henry, female    DOB: [birth year], 72 y.o.   MRN: [id number] CC: Edema (Bilateral legs and feet)  HPI: Stacy Henry is a 71 y.o. female presenting for Edema (Bilateral legs and feet)  About 3 weeks ago started having worsening swelling in her ankles and feet.  Has come and gone.  Sometimes worse in the evening after she has been on her feet.  Was started on amlodipine 3 months ago for elevated blood pressures.  Checking blood pressures at home initially after start, they were [id number] systolics at home.  Sometimes she has shortness of breath with exertion, feels like she cannot do as much as she has in the past.  Has been working in her garden regularly.  Relevant past medical, surgical, family and social history reviewed. Allergies and medications reviewed and updated. Social History   Tobacco Use  Smoking Status Never Smoker  Smokeless Tobacco Never Used   ROS: Per HPI   Objective:    BP 137/81   Pulse 74   Temp (!) 97.5 F (36.4 C) (Oral)   Ht 5' 4.75" (1.645 m)   Wt 166 lb 3.2 oz (75.4 kg)   BMI 27.87 kg/m   Wt Readings from Last 3 Encounters:  04/06/17 166 lb 3.2 oz (75.4 kg)  01/09/17 169 lb 12.8 oz (77 kg)  11/14/16 167 lb (75.8 kg)    Gen: NAD, alert, cooperative with exam, NCAT EYES: EOMI, no conjunctival injection, or no icterus ENT:  TMs pearly gray b/l, OP without erythema LYMPH: no cervical LAD CV: NRRR, normal S1/S2, no murmur, distal pulses 2+ b/l Resp: CTABL, no wheezes, normal WOB Abd: +BS, soft, NTND. no guarding or organomegaly Ext: No pitting edema, warm Neuro: Alert and oriented, strength equal b/l UE and LE, coordination grossly normal MSK: normal muscle bulk  Assessment & Plan:  Stacy Henry was seen today for edema.  Diagnoses and all orders for this visit:  Leg swelling -     ECHOCARDIOGRAM COMPLETE; Future  Essential hypertension Stop amlodipine because of leg swelling.  Continue losartan.   Patient is going to check blood pressures at home, return to clinic in 4 weeks with a log and cuff.  SOB (shortness of breath) Follow-up 4 weeks.  Any chest pain or worsening symptoms he is to be seen.  Follow up plan: Return in about 1 month (around 05/04/2017). Assunta Found, MD [place]

## 2017-04-10 ENCOUNTER — Ambulatory Visit (HOSPITAL_COMMUNITY)
Admission: RE | Admit: 2017-04-10 | Discharge: 2017-04-10 | Disposition: A | Discharge status: Home / Self Care | Payer: Medicare Other | Source: Ambulatory Visit | Attending: Pediatrics | Admitting: Pediatrics

## 2017-04-10 DIAGNOSIS — K219 Gastro-esophageal reflux disease without esophagitis: Secondary | ICD-10-CM | POA: Diagnosis not present

## 2017-04-10 DIAGNOSIS — M7989 Other specified soft tissue disorders: Secondary | ICD-10-CM | POA: Diagnosis not present

## 2017-04-10 DIAGNOSIS — E785 Hyperlipidemia, unspecified: Secondary | ICD-10-CM | POA: Insufficient documentation

## 2017-04-10 DIAGNOSIS — I10 Essential (primary) hypertension: Secondary | ICD-10-CM | POA: Insufficient documentation

## 2017-04-10 NOTE — Progress Notes (Signed)
*  PRELIMINARY RESULTS* Echocardiogram 2D Echocardiogram has been performed.  Stacy Henry 04/10/2017, 10:21 AM

## 2017-04-11 ENCOUNTER — Telehealth: Payer: Self-pay | Admitting: Pediatrics

## 2017-04-11 NOTE — Telephone Encounter (Signed)
Patient aware of echo  Results.

## 2017-04-13 ENCOUNTER — Telehealth: Payer: Self-pay | Admitting: Pediatrics

## 2017-04-19 NOTE — Telephone Encounter (Signed)
Patient has a follow up appointment scheduled. 

## 2017-04-24 ENCOUNTER — Ambulatory Visit (INDEPENDENT_AMBULATORY_CARE_PROVIDER_SITE_OTHER): Payer: Medicare Other | Admitting: Pediatrics

## 2017-04-24 ENCOUNTER — Encounter: Payer: Self-pay | Admitting: Pediatrics

## 2017-04-24 VITALS — BP 129/76 | HR 78 | Temp 97.3°F | Ht 64.75 in | Wt 167.6 lb

## 2017-04-24 DIAGNOSIS — R238 Other skin changes: Secondary | ICD-10-CM | POA: Diagnosis not present

## 2017-04-24 DIAGNOSIS — R233 Spontaneous ecchymoses: Secondary | ICD-10-CM

## 2017-04-24 DIAGNOSIS — I10 Essential (primary) hypertension: Secondary | ICD-10-CM | POA: Diagnosis not present

## 2017-04-24 MED ORDER — CHLORTHALIDONE 25 MG PO TABS: 25.0000 mg | ORAL_TABLET | Freq: Every day | ORAL | 3 refills | Status: DC

## 2017-04-24 NOTE — Progress Notes (Signed)
  Subjective:   Patient ID: Stacy Henry, female    DOB: 1945-10-21, 72 y.o.   MRN: [id number] CC: Follow-up (Blood Pressure)  HPI: Stacy Henry is a 72 y.o. female presenting for Follow-up (Blood Pressure)  Blood pressures at home persistently 140s over 90s.  Patient restarted amlodipine.  Also taking losartan daily.  Swelling in her ankles improved when she stopped the amlodipine, returned when she restarted it.  No headaches, chest pain, shortness of breath.  Recent echo with grade 1 diastolic dysfunction, normal ejection fraction.  Has noticed she is been easily bruised, washing her face her finger got caught on her eyebrow and she had bruising around the eye.  Small bruises over her forearms after doing yard work.  Relevant past medical, surgical, family and social history reviewed. Allergies and medications reviewed and updated. Social History   Tobacco Use  Smoking Status Never Smoker  Smokeless Tobacco Never Used   ROS: Per HPI   Objective:    BP 129/76   Pulse 78   Temp (!) 97.3 F (36.3 C) (Oral)   Ht 5' 4.75" (1.645 m)   Wt 167 lb 9.6 oz (76 kg)   BMI 28.11 kg/m   Wt Readings from Last 3 Encounters:  04/24/17 167 lb 9.6 oz (76 kg)  04/06/17 166 lb 3.2 oz (75.4 kg)  01/09/17 169 lb 12.8 oz (77 kg)    Gen: NAD, alert, cooperative with exam, NCAT EYES: EOMI, no conjunctival injection, or no icterus CV: NRRR, normal S1/S2, no murmur Resp: CTABL, no wheezes, normal WOB Abd: +BS, soft, NTND. no guarding or organomegaly Ext: No pitting edema, warm Neuro: Alert and oriented  Assessment & Plan:  Reegan was seen today for follow-up blood pressure  Diagnoses and all orders for this visit:  Essential hypertension Improved control today, back on amlodipine with worsening swelling in her ankles.  Patient wants to stop amlodipine.  Continue losartan.  Start below.  Return to clinic in 2-3 weeks for repeat blood work and blood pressure readings.  Continue  to check regularly at home. -     chlorthalidone (HYGROTON) 25 MG tablet; Take 1 tablet (25 mg total) by mouth daily. -     Basic Metabolic Panel; Future  Easy bruising -     CBC with Differential; Future   Follow up plan: Return in about 3 weeks (around 05/15/2017). Assunta Found, MD [place]

## 2017-05-02 ENCOUNTER — Encounter: Payer: Self-pay | Admitting: Cardiovascular Disease

## 2017-05-15 ENCOUNTER — Encounter: Payer: Medicare Other | Admitting: *Deleted

## 2017-05-15 ENCOUNTER — Other Ambulatory Visit: Payer: Medicare Other

## 2017-05-15 DIAGNOSIS — I10 Essential (primary) hypertension: Secondary | ICD-10-CM | POA: Diagnosis not present

## 2017-05-15 DIAGNOSIS — R233 Spontaneous ecchymoses: Secondary | ICD-10-CM

## 2017-05-15 DIAGNOSIS — R238 Other skin changes: Secondary | ICD-10-CM

## 2017-05-16 LAB — BASIC METABOLIC PANEL
BUN / CREAT RATIO: 26 (ref 12–28)
BUN: 32 mg/dL — ABNORMAL HIGH (ref 8–27)
CHLORIDE: 101 mmol/L (ref 96–106)
CO2: 23 mmol/L (ref 20–29)
Calcium: 9.1 mg/dL (ref 8.7–10.3)
Creatinine, Ser: 1.22 mg/dL — ABNORMAL HIGH (ref 0.57–1.00)
GFR calc Af Amer: 51 mL/min/{1.73_m2} — ABNORMAL LOW (ref >59)
GFR calc non Af Amer: 44 mL/min/{1.73_m2} — ABNORMAL LOW (ref >59)
GLUCOSE: 79 mg/dL (ref 65–99)
POTASSIUM: 4.5 mmol/L (ref 3.5–5.2)
SODIUM: 139 mmol/L (ref 134–144)

## 2017-05-16 LAB — CBC WITH DIFFERENTIAL/PLATELET
BASOS ABS: 0.1 10*3/uL (ref 0.0–0.2)
Basos: 1 %
EOS (ABSOLUTE): 0.4 10*3/uL (ref 0.0–0.4)
Eos: 4 %
HEMOGLOBIN: 13.4 g/dL (ref 11.1–15.9)
Hematocrit: 40.9 % (ref 34.0–46.6)
Immature Grans (Abs): 0 10*3/uL (ref 0.0–0.1)
Immature Granulocytes: 0 %
LYMPHS ABS: 3.3 10*3/uL — AB (ref 0.7–3.1)
Lymphs: 39 %
MCH: 28.8 pg (ref 26.6–33.0)
MCHC: 32.8 g/dL (ref 31.5–35.7)
MCV: 88 fL (ref 79–97)
MONOS ABS: 0.9 10*3/uL (ref 0.1–0.9)
Monocytes: 10 %
NEUTROS ABS: 3.9 10*3/uL (ref 1.4–7.0)
Neutrophils: 46 %
PLATELETS: 227 10*3/uL (ref 150–379)
RBC: 4.66 x10E6/uL (ref 3.77–5.28)
RDW: 14.3 % (ref 12.3–15.4)
WBC: 8.5 10*3/uL (ref 3.4–10.8)

## 2017-05-18 DIAGNOSIS — H43812 Vitreous degeneration, left eye: Secondary | ICD-10-CM | POA: Diagnosis not present

## 2017-05-18 DIAGNOSIS — H527 Unspecified disorder of refraction: Secondary | ICD-10-CM | POA: Diagnosis not present

## 2017-05-18 DIAGNOSIS — H02831 Dermatochalasis of right upper eyelid: Secondary | ICD-10-CM | POA: Diagnosis not present

## 2017-05-18 DIAGNOSIS — H11003 Unspecified pterygium of eye, bilateral: Secondary | ICD-10-CM | POA: Diagnosis not present

## 2017-05-18 DIAGNOSIS — H25813 Combined forms of age-related cataract, bilateral: Secondary | ICD-10-CM | POA: Diagnosis not present

## 2017-05-18 DIAGNOSIS — H02834 Dermatochalasis of left upper eyelid: Secondary | ICD-10-CM | POA: Diagnosis not present

## 2017-05-22 ENCOUNTER — Encounter (INDEPENDENT_AMBULATORY_CARE_PROVIDER_SITE_OTHER): Payer: Self-pay

## 2017-05-22 ENCOUNTER — Ambulatory Visit: Payer: Medicare Other | Admitting: Cardiovascular Disease

## 2017-05-22 ENCOUNTER — Encounter

## 2017-05-22 ENCOUNTER — Encounter: Payer: Self-pay | Admitting: *Deleted

## 2017-05-22 ENCOUNTER — Encounter: Payer: Self-pay | Admitting: Cardiovascular Disease

## 2017-05-22 VITALS — BP 126/72 | HR 76 | Ht 64.75 in | Wt 165.0 lb

## 2017-05-22 DIAGNOSIS — R072 Precordial pain: Secondary | ICD-10-CM | POA: Diagnosis not present

## 2017-05-22 NOTE — Progress Notes (Signed)
Chief Complaint  Patient presents with  . New Patient (Initial Visit)     History of Present Illness: 72 yo female with history of HTN, GERD, ischemic colitis who is here today as a new consult, referred by Dr. Evette Doffing, for the evaluation of chest pain. She is known to have HTN and had LE edema while on Norvasc. Echo April 2019 with normal LV systolic function with LVEF 36-64%, grade 1 diastolic dysfunction, mild AI, mild MR. She describes central chest pain, stinging in nature, no radiation. Her arms feel heavy. Pain is worse with exertion. No dyspnea. LE edema has resolved.   Primary Care Physician: Eustaquio Maize, MD   Past Medical History:  Diagnosis Date  . Allergy   . GERD (gastroesophageal reflux disease)   . HTN (hypertension)   . Hyperlipidemia   . Insomnia   . Ischemic colitis ([place]) 2011  . Osteopenia     Past Surgical History:  Procedure Laterality Date  . APPENDECTOMY  as teenager  . BREAST SURGERY Bilateral    "lump"removal  . COLONOSCOPY  2006   RMR: normal.  . COLONOSCOPY  2012   [facility]: most c/w ischemic colitis, no path  . COLONOSCOPY N/A 12/03/2014   Procedure: COLONOSCOPY;  Surgeon: Daneil Dolin, MD;  Location: [facility];  Service: Endoscopy;  Laterality: N/A;  0900  . EYE SURGERY Bilateral   . FINGER SURGERY      Current Outpatient Medications  Medication Sig Dispense Refill  . b complex vitamins tablet Take 1 tablet by mouth daily.    Marland Kitchen BIOTIN PO Take 1 tablet by mouth daily.    . chlorthalidone (HYGROTON) 25 MG tablet Take 25 mg by mouth daily.    . clobetasol cream (TEMOVATE) 4.03 % Apply 1 application topically 2 (two) times daily. 60 g 2  . losartan (COZAAR) 100 MG tablet Take 1 tablet (100 mg total) by mouth daily. 90 tablet 1  . Melatonin 10 MG SUBL Place 10 mg under the tongue.    . Multiple Vitamin (MULTIVITAMIN WITH MINERALS) TABS tablet Take 1 tablet by mouth daily.    . pantoprazole (PROTONIX) 40 MG tablet  TAKE 1 TABLET DAILY 90 tablet 1  . pravastatin (PRAVACHOL) 40 MG tablet Take 1 tablet (40 mg total) at bedtime by mouth. 90 tablet 1  . valACYclovir (VALTREX) 1000 MG tablet 2gm twice a day for 24h for symptoms (Patient taking differently: Take 1,000 mg as needed by mouth. 2gm twice a day for 24h for symptoms) 20 tablet 1   No current facility-administered medications for this visit.     Allergies  Allergen Reactions  . Sulfa Antibiotics     Social History   Socioeconomic History  . Marital status: Married    Spouse name: Not on file  . Number of children: 2  . Years of education: Not on file  . Highest education level: Not on file  Occupational History  . Occupation: Electronics engineer  Social Needs  . Financial resource strain: Not on file  . Food insecurity:    Worry: Not on file    Inability: Not on file  . Transportation needs:    Medical: Not on file    Non-medical: Not on file  Tobacco Use  . Smoking status: Never Smoker  . Smokeless tobacco: Never Used  Substance and Sexual Activity  . Alcohol use: Yes    Comment: ocassional wine - less than weekly  . Drug use: No  .  Sexual activity: Yes    Partners: Male  Lifestyle  . Physical activity:    Days per week: Not on file    Minutes per session: Not on file  . Stress: Not on file  Relationships  . Social connections:    Talks on phone: Not on file    Gets together: Not on file    Attends religious service: Not on file    Active member of club or organization: Not on file    Attends meetings of clubs or organizations: Not on file    Relationship status: Not on file  . Intimate partner violence:    Fear of current or ex partner: Not on file    Emotionally abused: Not on file    Physically abused: Not on file    Forced sexual activity: Not on file  Other Topics Concern  . Not on file  Social History Narrative  . Not on file    Family History  Problem Relation Age of Onset  . Alcohol abuse Sister   .  Alcoholism Sister   . Diabetes Brother   . Heart attack Brother 36  . Aneurysm Brother   . Alcoholism Brother   . Diabetes Brother   . Cirrhosis Brother   . Alcohol abuse Brother   . Alcoholism Brother   . Hypertension Mother   . Atrial fibrillation Mother   . Macular degeneration Mother   . Heart attack Father 62  . Kidney disease Sister   . Atrial fibrillation Sister   . Colon cancer Neg Hx     Review of Systems:  As stated in the HPI and otherwise negative.   BP 126/72   Pulse 76   Ht 5' 4.75" (1.645 m)   Wt 165 lb (74.8 kg)   SpO2 98%   BMI 27.67 kg/m   Physical Examination: General: Well developed, well nourished, NAD  HEENT: OP clear, mucus membranes moist  SKIN: warm, dry. No rashes. Neuro: No focal deficits  Musculoskeletal: Muscle strength 5/5 all ext  Psychiatric: Mood and affect normal  Neck: No JVD, no carotid bruits, no thyromegaly, no lymphadenopathy.  Lungs:Clear bilaterally, no wheezes, rhonci, crackles Cardiovascular: Regular rate and rhythm. No murmurs, gallops or rubs. Abdomen:Soft. Bowel sounds present. Non-tender.  Extremities: No lower extremity edema. Pulses are 2 + in the bilateral DP/PT.  EKG:  EKG is ordered today. The ekg ordered today demonstrates NSR, rate 76 bpm  Recent Labs: 05/15/2017: BUN 32; Creatinine, Ser 1.22; Hemoglobin 13.4; Platelets 227; Potassium 4.5; Sodium 139   Lipid Panel    Component Value Date/Time   CHOL 194 05/10/2016 1509   TRIG 217 (H) 05/10/2016 1509   TRIG 185 (H) 12/13/2013 0943   HDL 51 05/10/2016 1509   HDL 49 12/13/2013 0943   CHOLHDL 3.8 05/10/2016 1509   CHOLHDL 4.4 Ratio 05/13/2009 1913   VLDL 39 05/13/2009 1913   LDLCALC 100 (H) 05/10/2016 1509   LDLCALC 113 (H) 10/25/2012 0936     Wt Readings from Last 3 Encounters:  05/22/17 165 lb (74.8 kg)  04/24/17 167 lb 9.6 oz (76 kg)  04/06/17 166 lb 3.2 oz (75.4 kg)     Other studies Reviewed: Additional studies/ records that were reviewed today  include: . Review of the above records demonstrates:    Assessment and Plan:   1. Chest pain: She has left sided chest pain. This occurs at rest and with exertion. Given risk factors for CAD including age, HTN, HLD and FH  of CAD, will arrange exercise nuclear stress test to exclude ischemia.   Current medicines are reviewed at length with the patient today.  The patient does not have concerns regarding medicines.  The following changes have been made:  no change  Labs/ tests ordered today include:   Orders Placed This Encounter  Procedures  . MYOCARDIAL PERFUSION IMAGING  . EKG 12-Lead     Disposition:   FU with me in 6 weeks   Signed, Lauree Chandler, MD 05/22/2017 12:20 PM    [facility] [place], [place], [place]  [address] Phone: [phone]; Fax: [phone]

## 2017-05-22 NOTE — Patient Instructions (Signed)
Medication Instructions:  Your physician recommends that you continue on your current medications as directed. Please refer to the Current Medication list given to you today.   Labwork: none  Testing/Procedures: Your physician has requested that you have an exercise stress myoview. For further information please visit [email or web address]. Please follow instruction sheet, as given.   Follow-Up: You are scheduled to see Dr. Angelena Form on June 11,2019 at 12:00 for follow up  Any Other Special Instructions Will Be Listed Below (If Applicable).     If you need a refill on your cardiac medications before your next appointment, please call your pharmacy.

## 2017-05-25 ENCOUNTER — Telehealth (HOSPITAL_COMMUNITY): Payer: Self-pay | Admitting: *Deleted

## 2017-05-25 NOTE — Telephone Encounter (Signed)
Left message on voicemail per DPR in reference to upcoming appointment scheduled on 05/30/17 with detailed instructions given per Myocardial Perfusion Study Information Sheet for the test. LM to arrive 15 minutes early, and that it is imperative to arrive on time for appointment to keep from having the test rescheduled. If you need to cancel or reschedule your appointment, please call the office within 24 hours of your appointment. Failure to do so may result in a cancellation of your appointment, and a $50 no show fee. Phone number given for call back for any questions. Kamillah Didonato Jacqueline    

## 2017-05-25 NOTE — Telephone Encounter (Deleted)
Left message on voicemail per DPR in reference to upcoming appointment scheduled on 05/30/17 with detailed instructions given per Myocardial Perfusion Study Information Sheet for the test. LM to arrive 15 minutes early, and that it is imperative to arrive on time for appointment to keep from having the test rescheduled. If you need to cancel or reschedule your appointment, please call the office within 24 hours of your appointment. Failure to do so may result in a cancellation of your appointment, and a $50 no show fee. Phone number given for call back for any questions. Stacy Henry Jacqueline    

## 2017-05-30 ENCOUNTER — Encounter (HOSPITAL_COMMUNITY): Payer: Medicare Other

## 2017-05-31 ENCOUNTER — Encounter: Payer: Self-pay | Admitting: Cardiovascular Disease

## 2017-06-12 ENCOUNTER — Telehealth: Payer: Self-pay | Admitting: Cardiovascular Disease

## 2017-06-13 ENCOUNTER — Telehealth (HOSPITAL_COMMUNITY): Payer: Self-pay | Admitting: *Deleted

## 2017-06-13 ENCOUNTER — Ambulatory Visit: Payer: Medicare Other | Admitting: Cardiovascular Disease

## 2017-06-13 NOTE — Telephone Encounter (Signed)
Left message on voicemail per DPR in reference to upcoming appointment scheduled on 06/16/17 with detailed instructions given per Myocardial Perfusion Study Information Sheet for the test. LM to arrive 15 minutes early, and that it is imperative to arrive on time for appointment to keep from having the test rescheduled. If you need to cancel or reschedule your appointment, please call the office within 24 hours of your appointment. Failure to do so may result in a cancellation of your appointment, and a $50 no show fee. Phone number given for call back for any questions. Kirstie Peri

## 2017-06-16 ENCOUNTER — Ambulatory Visit (HOSPITAL_COMMUNITY): Payer: Medicare Other | Attending: Cardiovascular Disease

## 2017-06-16 DIAGNOSIS — R079 Chest pain, unspecified: Secondary | ICD-10-CM | POA: Diagnosis not present

## 2017-06-16 DIAGNOSIS — R072 Precordial pain: Secondary | ICD-10-CM | POA: Diagnosis not present

## 2017-06-16 LAB — MYOCARDIAL PERFUSION IMAGING
CHL CUP MPHR: 147 {beats}/min
CHL CUP NUCLEAR SRS: 6
CHL CUP NUCLEAR SSS: 13
CSEPEDS: 30 s
CSEPHR: 110 %
Estimated workload: 4.6 METS
Exercise duration (min): 3 min
LHR: 0.27
LVDIAVOL: 45 mL (ref 46–106)
LVSYSVOL: 15 mL
NUC STRESS TID: 0.85
Peak HR: 162 {beats}/min
Rest HR: 74 {beats}/min
SDS: 8

## 2017-06-16 IMAGING — NM NM MISC PROCEDURE
5 series · 30 of 30 positions shown · non-contrast
Comparison: none

[Series 1: wbr_r-proj_st rest · 6.51mm/px · 6 of 64 frames shown]
[frame 6/64]
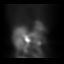
[frame 16/64]
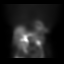
[frame 27/64]
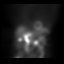
[frame 38/64]
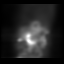
[frame 48/64]
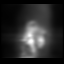
[frame 59/64]
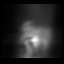

[Series 1: rest · 6.51mm/px · 6 of 64 frames shown]
[frame 6/64]
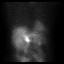
[frame 16/64]
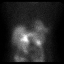
[frame 27/64]
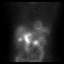
[frame 38/64]
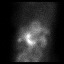
[frame 48/64]
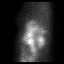
[frame 59/64]
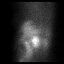

[Series 2: wbr_s-proj_st stress - gated · 6.51mm/px · 6 of 512 frames shown]
[frame 43/512]
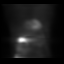
[frame 128/512]
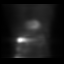
[frame 214/512]
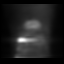
[frame 299/512]
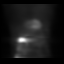
[frame 384/512]
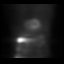
[frame 470/512]
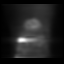

[Series 2: stress - gated · 6.51mm/px · 6 of 512 frames shown]
[frame 43/512]
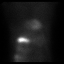
[frame 128/512]
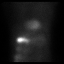
[frame 214/512]
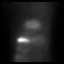
[frame 299/512]
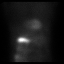
[frame 384/512]
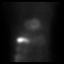
[frame 470/512]
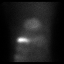

[Series 3: stress - perfusion · 6.51mm/px · 6 of 64 frames shown]
[frame 6/64]
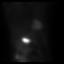
[frame 16/64]
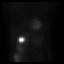
[frame 27/64]
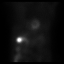
[frame 38/64]
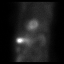
[frame 48/64]
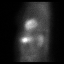
[frame 59/64]
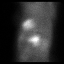

[30 of 30 positions shown; findings below may reference images not displayed]

Canned report from images found in remote index.

Refer to host system for actual result text.

## 2017-06-16 MED ORDER — TECHNETIUM TC 99M TETROFOSMIN IV KIT
10.5000 | PACK | Freq: Once | INTRAVENOUS | Status: AC | PRN
  Administered 2017-06-16: 10.5 via INTRAVENOUS
  Filled 2017-06-16: qty 11

## 2017-06-16 MED ORDER — TECHNETIUM TC 99M TETROFOSMIN IV KIT
30.3000 | PACK | Freq: Once | INTRAVENOUS | Status: AC | PRN
  Administered 2017-06-16: 30.3 via INTRAVENOUS
  Filled 2017-06-16: qty 31

## 2017-06-21 NOTE — Telephone Encounter (Signed)
error 

## 2017-07-24 DIAGNOSIS — E785 Hyperlipidemia, unspecified: Secondary | ICD-10-CM | POA: Insufficient documentation

## 2017-07-24 DIAGNOSIS — I10 Essential (primary) hypertension: Secondary | ICD-10-CM | POA: Insufficient documentation

## 2017-07-24 DIAGNOSIS — R079 Chest pain, unspecified: Secondary | ICD-10-CM | POA: Insufficient documentation

## 2017-07-24 NOTE — Progress Notes (Deleted)
Cardiology Office Note    Date:  07/24/2017   ID:  Stacy Henry, Stacy Henry 1945/08/01, MRN [id number]  PCP:  Eustaquio Maize, MD  Cardiologist: Lauree Chandler, MD  No chief complaint on file.   History of Present Illness:  Stacy Henry is a 72 y.o. female with history of hypertension, HLD, family history of CAD, ischemic colitis, GERD who saw Dr. Angelena Form 05/22/2017 for chest pain.  2D echo 04/2017 normal LVEF 60 to 65% with grade 1 DD, mild AI and mild MR.  Nuclear stress test 06/16/2017 low risk with normal perfusion and normal LV function, borderline acceptable exercise duration of 3 minutes 30 seconds with increased heart rate to 110% which reduces the study sensitivity for CAD detection.  Blood pressure demonstrated a hypertensive response to exercise.    Past Medical History:  Diagnosis Date  . Allergy   . GERD (gastroesophageal reflux disease)   . HTN (hypertension)   . Hyperlipidemia   . Insomnia   . Ischemic colitis ([place]) 2011  . Osteopenia     Past Surgical History:  Procedure Laterality Date  . APPENDECTOMY  as teenager  . BREAST SURGERY Bilateral    "lump"removal  . COLONOSCOPY  2006   RMR: normal.  . COLONOSCOPY  2012   [facility]: most c/w ischemic colitis, no path  . COLONOSCOPY N/A 12/03/2014   Procedure: COLONOSCOPY;  Surgeon: Daneil Dolin, MD;  Location[facility];  Service: Endoscopy;  Laterality: N/A;  0900  . EYE SURGERY Bilateral   . FINGER SURGERY      Current Medications: No outpatient medications have been marked as taking for the 07/25/17 encounter (Appointment) with Imogene Burn, PA-C.     Allergies:   Sulfa antibiotics   Social History   Socioeconomic History  . Marital status: Married    Spouse name: Not on file  . Number of children: 2  . Years of education: Not on file  . Highest education level: Not on file  Occupational History  . Occupation: Electronics engineer  Social Needs  .  Financial resource strain: Not on file  . Food insecurity:    Worry: Not on file    Inability: Not on file  . Transportation needs:    Medical: Not on file    Non-medical: Not on file  Tobacco Use  . Smoking status: Never Smoker  . Smokeless tobacco: Never Used  Substance and Sexual Activity  . Alcohol use: Yes    Comment: ocassional wine - less than weekly  . Drug use: No  . Sexual activity: Yes    Partners: Male  Lifestyle  . Physical activity:    Days per week: Not on file    Minutes per session: Not on file  . Stress: Not on file  Relationships  . Social connections:    Talks on phone: Not on file    Gets together: Not on file    Attends religious service: Not on file    Active member of club or organization: Not on file    Attends meetings of clubs or organizations: Not on file    Relationship status: Not on file  Other Topics Concern  . Not on file  Social History Narrative  . Not on file     Family History:  The patient's ***family history includes Alcohol abuse in her brother and sister; Alcoholism in her brother, brother, and sister; Aneurysm in her brother; Atrial fibrillation in her mother and  sister; Cirrhosis in her brother; Diabetes in her brother and brother; Heart attack (age of onset: 57) in her brother; Heart attack (age of onset: 24) in her father; Hypertension in her mother; Kidney disease in her sister; Macular degeneration in her mother.   ROS:   Please see the history of present illness.    ROS All other systems reviewed and are negative.   PHYSICAL EXAM:   VS:  There were no vitals taken for this visit.  Physical Exam  GEN: Well nourished, well developed, in no acute distress  HEENT: normal  Neck: no JVD, carotid bruits, or masses Cardiac:RRR; no murmurs, rubs, or gallops  Respiratory:  clear to auscultation bilaterally, normal work of breathing GI: soft, nontender, nondistended, + BS Ext: without cyanosis, clubbing, or edema, Good distal  pulses bilaterally MS: no deformity or atrophy  Skin: warm and dry, no rash Neuro:  Alert and Oriented x 3, Strength and sensation are intact Psych: euthymic mood, full affect  Wt Readings from Last 3 Encounters:  05/22/17 165 lb (74.8 kg)  04/24/17 167 lb 9.6 oz (76 kg)  04/06/17 166 lb 3.2 oz (75.4 kg)      Studies/Labs Reviewed:   EKG:  EKG is*** ordered today.  The ekg ordered today demonstrates ***  Recent Labs: 05/15/2017: BUN 32; Creatinine, Ser 1.22; Hemoglobin 13.4; Platelets 227; Potassium 4.5; Sodium 139   Lipid Panel    Component Value Date/Time   CHOL 194 05/10/2016 1509   TRIG 217 (H) 05/10/2016 1509   TRIG 185 (H) 12/13/2013 0943   HDL 51 05/10/2016 1509   HDL 49 12/13/2013 0943   CHOLHDL 3.8 05/10/2016 1509   CHOLHDL 4.4 Ratio 05/13/2009 1913   VLDL 39 05/13/2009 1913   LDLCALC 100 (H) 05/10/2016 1509   LDLCALC 113 (H) 10/25/2012 0936    Additional studies/ records that were reviewed today include:  Exercise Myoview 06/16/2017  Study Highlights      Nuclear stress EF: 67%.  There was no ST segment deviation noted during stress.  No T wave inversion was noted during stress.  The study is normal.  This is a low risk study.  Blood pressure demonstrated a hypertensive response to exercise.   Low risk stress nuclear study with normal perfusion and normal left ventricular regional and global systolic function. Note borderline acceptable exercise duration, which reduces the study sensitivity for CAD detection.     2D echo 4/8/2019Study Conclusions   - Left ventricle: The cavity size was normal. Wall thickness was   normal. Systolic function was normal. The estimated ejection   fraction was in the range of 60% to 65%. Wall motion was normal;   there were no regional wall motion abnormalities. Doppler   parameters are consistent with abnormal left ventricular   relaxation (grade 1 diastolic dysfunction). - Aortic valve: Mildly calcified annulus.  Trileaflet. There was   mild regurgitation. - Mitral valve: There was mild regurgitation. - Right atrium: Central venous pressure (est): 3 mm Hg. - Atrial septum: No defect or patent foramen ovale was identified. - Tricuspid valve: There was physiologic regurgitation. - Pulmonary arteries: Systolic pressure could not be accurately   estimated. - Pericardium, extracardiac: There was no pericardial effusion.   ASSESSMENT:    1. Other chest pain   2. Essential hypertension   3. Mixed hyperlipidemia      PLAN:  In order of problems listed above:  Chest pain nuclear stress test reassuring normal LVEF, no ischemia, low risk, did have  a hypertensive response to exercise  Essential hypertension  Mixed hyperlipidemia    Medication Adjustments/Labs and Tests Ordered: Current medicines are reviewed at length with the patient today.  Concerns regarding medicines are outlined above.  Medication changes, Labs and Tests ordered today are listed in the Patient Instructions below. There are no Patient Instructions on file for this visit.   Signed, Ermalinda Barrios, PA-C  07/24/2017 2:06 PM    [facility] [address], [place], [place]  [address] Phone: [phone]; Fax: [phone]

## 2017-07-25 ENCOUNTER — Ambulatory Visit: Payer: Medicare Other | Admitting: Physician Assistant

## 2017-08-07 ENCOUNTER — Other Ambulatory Visit: Payer: Self-pay | Admitting: Pediatrics

## 2017-08-07 DIAGNOSIS — I10 Essential (primary) hypertension: Secondary | ICD-10-CM

## 2017-08-16 ENCOUNTER — Other Ambulatory Visit: Payer: Self-pay | Admitting: Family

## 2017-08-16 ENCOUNTER — Other Ambulatory Visit: Payer: Self-pay | Admitting: Pediatrics

## 2017-08-16 DIAGNOSIS — E785 Hyperlipidemia, unspecified: Secondary | ICD-10-CM

## 2017-08-29 ENCOUNTER — Other Ambulatory Visit: Payer: Self-pay | Admitting: Pediatrics

## 2017-08-29 DIAGNOSIS — I10 Essential (primary) hypertension: Secondary | ICD-10-CM

## 2017-10-04 ENCOUNTER — Encounter: Payer: Self-pay | Admitting: Family Medicine

## 2017-10-04 ENCOUNTER — Ambulatory Visit (INDEPENDENT_AMBULATORY_CARE_PROVIDER_SITE_OTHER): Payer: Medicare Other | Admitting: Family Medicine

## 2017-10-04 VITALS — BP 135/81 | HR 94 | Temp 97.4°F | Ht 64.75 in | Wt 167.0 lb

## 2017-10-04 DIAGNOSIS — Z23 Encounter for immunization: Secondary | ICD-10-CM | POA: Diagnosis not present

## 2017-10-04 DIAGNOSIS — T25212A Burn of second degree of left ankle, initial encounter: Secondary | ICD-10-CM | POA: Diagnosis not present

## 2017-10-04 MED ORDER — SILVER SULFADIAZINE 1 % EX CREA: 1.0000 "application " | TOPICAL_CREAM | Freq: Every day | CUTANEOUS | 0 refills | Status: DC
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
## 2017-10-04 NOTE — Patient Instructions (Signed)
Second-Degree Burn, Adult A second-degree burn, also called a partial thickness wound, is a serious injury that affects the first two layers of skin. A second-degree burn may be minor or major, depending on the size of the burn and which parts of the skin are burned. What are the causes? This condition may be caused by:  Heat. Burns caused by heat happen when skin comes in contact with something very hot, such as a flame or hot liquid.  Radiation. Sources of radiation include sunlight, radiation used to heat food, and radiation treatments.  Electricity. Burns caused by electricity happen when electricity passes through the body from lightning, wiring, electrical outlets, appliances, or power lines.  Certain chemicals, such as acids that come in contact with the skin or eyes. Some chemicals can go through clothing.  What increases the risk? This condition is more likely to occur in people who:  Are often exposed to high-risk environments, such as those with open flames, chemicals, or electricity.  Have cancer and are being treated with radiation.  What are the signs or symptoms? Symptoms of this condition include:  Severe pain.  Skin that is deep red, blistered, tender, and swollen.  Skin that has changed color.  Skin that looks blotchy, wet, or shiny.  How is this diagnosed? This condition is usually diagnosed with an exam of the wounded area. To get a better look at the wound, your health care provider may remove any blistered skin. It may take several days to find out if you have a second-degree burn because the signs of this kind of burn can take time to develop. You may need to watch the wound for changes at home and visit a health care provider repeatedly to have your wound checked for changes. If the wound is large, you may need to stay in the hospital so a health care team can examine the wound for a few days. How is this treated? Treatment depends on the severity and cause  of the burn. Healing may take several weeks. Some second-degree burns, including major burns, electrical burns, and chemical burns, may need to be treated in a hospital. Treatment may involve:  Cooling the burn with cool, germ-free (sterile) water.  Taking or applying medicines, such as: ? Medicines to relieve pain or itching. ? Ointments to treat or prevent infection. ? Antibiotic medicine to treat or prevent infection.  Getting a tetanus shot.  Covering the burn with a bandage (dressing). If your fingers or toes were burned, each of them may be bandaged separately.  Compression dressings to prevent scarring and help the burned body part stay moveable.  Removing dead skin. This is done by a health care provider. Do not try to remove dead skin yourself.  Deep burns can cause skin tissue to die, and a scab (eschar) may form where the skin used to be. If you have a deep burn and an eschar forms, you may need surgery to remove the eschar so the skin can heal properly. If your wound is deep and large (it covers more than 15% of your skin), treatment may also involve:  Receiving fluids and nutrition.  Close monitoring of blood flow near the wound.  Oxygen given through a mask or a machine (ventilator). This may be needed if a burn causes fluid shifts in the body that make it hard to breathe.  Follow these instructions at home: Wound care  Follow instructions from your health care provider about how to take care of your   wound. Make sure you: ? Wash your hands with soap and water before you change your dressing. If soap and water are not available, use hand sanitizer. ? Change your dressing as directed. ? If you have a compression dressing, wear it as directed.  Clean your wound 2-3 times a day or as often as directed. ? Wash the wound with mild soap and water. ? Rinse the wound with water to remove all soap. ? Pat the wound dry with a clean towel. Do not rub it.  Check your wound  every day for signs of infection. Check for: ? More redness, swelling, or pain. ? More fluid or blood. ? Warmth. ? Pus or a bad smell. ? Yellow or green fluid.  Do not scratch or pick at the wound  Do not break any blisters or peel any skin.  Avoid exposing your wound to the sun. Medicine  Take and apply over-the-counter and prescription medicines only as told by your health care provider.  Take or apply your antibiotic medicine as told by your health care provider. Do not stop using the antibiotic even if you start to feel better. Eating and drinking  Drink enough fluid to keep your urine clear or pale yellow.  Eat a nutritious diet that is high in protein. This will help your wound to heal. General instructions  Raise (elevate) the injured area above the level of your heart while sitting or lying down.  Rest as directed. Do not exercise until your health care provider approves.  Do not take baths, swim, or use a hot tub until your health care provider approves.  Do not put ice on your burn. This can cause more damage. Try cooling the burn with: ? Cool water. ? A cool, wet, clean cloth (cool compress).  Keep all follow-up visits as directed. This is important. Contact a health care provider if:  Your symptoms do not improve with treatment.  Your pain is not relieved with medicine.  You have more redness, swelling, or pain around your wound.  You have more fluid or blood coming from your wound.  You have yellow or green fluid, pus, or a bad smell coming from your wound.  Your wound feels warm to the touch.  You have a fever. Get help right away if:  You develop red streaks near the wound.  You develop severe pain. Summary  A second-degree burn is a serious injury that affects the first two layers of skin.  Clean your wound 2-3 times a day or as often as directed. Check your wound every day for signs of infection.  Do not scratch or pick at your wound,  break blisters, peel skin, or put ice on your burn. This information is not intended to replace advice given to you by your health care provider. Make sure you discuss any questions you have with your health care provider. Document Released: 05/24/2010 Document Revised: 12/01/2015 Document Reviewed: 12/01/2015 Elsevier Interactive Patient Education  2018 [employer]   

## 2017-10-06 ENCOUNTER — Telehealth: Payer: Self-pay | Admitting: Pediatrics

## 2017-10-06 MED ORDER — CEPHALEXIN 500 MG PO CAPS: 500.0000 mg | ORAL_CAPSULE | Freq: Three times a day (TID) | ORAL | 0 refills | Status: DC

## 2017-10-06 NOTE — Telephone Encounter (Signed)
I sent in keflex, needs to return for recheck within 3-4 days. Any worsening must be seen immediately.

## 2017-10-06 NOTE — Telephone Encounter (Signed)
PT state that both ankles are Red, feverish feeling, both ankles are swollen, was given cream not helping and the pain is bad, afraid it is infected had chills all day yesterday. Pt doesn't want to have to come back down here, states that the provider took pictures of it.  Wants to know if an antibiotic could be sent in or something to help.  Stacy Henry

## 2017-10-06 NOTE — Telephone Encounter (Signed)
Patient aware and verbalized understanding. °

## 2017-10-24 ENCOUNTER — Ambulatory Visit (INDEPENDENT_AMBULATORY_CARE_PROVIDER_SITE_OTHER): Payer: Medicare Other | Admitting: *Deleted

## 2017-10-24 ENCOUNTER — Encounter: Payer: Self-pay | Admitting: *Deleted

## 2017-10-24 VITALS — BP 115/72 | HR 82 | Ht 64.75 in | Wt 168.0 lb

## 2017-10-24 DIAGNOSIS — Z0000 Encounter for general adult medical examination without abnormal findings: Secondary | ICD-10-CM | POA: Diagnosis not present
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
## 2017-10-24 NOTE — Patient Instructions (Signed)
Please work on your goal of increasing your exercise to 3 times per week for 30 minutes per session.  Please review the information given on Advance Directives, and if you complete these please bring a copy to our office to be filed in your chart.   Consider getting the Shingrix (Shingles) vaccine in the future.   Thank you for coming in for your Annual Wellness Visit today!!   Preventive Care 65 Years and Older, Female Preventive care refers to lifestyle choices and visits with your health care provider that can promote health and wellness. What does preventive care include?  A yearly physical exam. This is also called an annual well check.  Dental exams once or twice a year.  Routine eye exams. Ask your health care provider how often you should have your eyes checked.  Personal lifestyle choices, including: ? Daily care of your teeth and gums. ? Regular physical activity. ? Eating a healthy diet. ? Avoiding tobacco and drug use. ? Limiting alcohol use. ? Practicing safe sex. ? Taking low-dose aspirin every day. ? Taking vitamin and mineral supplements as recommended by your health care provider. What happens during an annual well check? The services and screenings done by your health care provider during your annual well check will depend on your age, overall health, lifestyle risk factors, and family history of disease. Counseling Your health care provider may ask you questions about your:  Alcohol use.  Tobacco use.  Drug use.  Emotional well-being.  Home and relationship well-being.  Sexual activity.  Eating habits.  History of falls.  Memory and ability to understand (cognition).  Work and work Statistician.  Reproductive health.  Screening You may have the following tests or measurements:  Height, weight, and BMI.  Blood pressure.  Lipid and cholesterol levels. These may be checked every 5 years, or more frequently if you are over 47 years  old.  Skin check.  Lung cancer screening. You may have this screening every year starting at age 82 if you have a 30-pack-year history of smoking and currently smoke or have quit within the past 15 years.  Fecal occult blood test (FOBT) of the stool. You may have this test every year starting at age 31.  Flexible sigmoidoscopy or colonoscopy. You may have a sigmoidoscopy every 5 years or a colonoscopy every 10 years starting at age 64.  Hepatitis C blood test.  Hepatitis B blood test.  Sexually transmitted disease (STD) testing.  Diabetes screening. This is done by checking your blood sugar (glucose) after you have not eaten for a while (fasting). You may have this done every 1-3 years.  Bone density scan. This is done to screen for osteoporosis. You may have this done starting at age 17.  Mammogram. This may be done every 1-2 years. Talk to your health care provider about how often you should have regular mammograms.  Talk with your health care provider about your test results, treatment options, and if necessary, the need for more tests. Vaccines Your health care provider may recommend certain vaccines, such as:  Influenza vaccine. This is recommended every year.  Tetanus, diphtheria, and acellular pertussis (Tdap, Td) vaccine. You may need a Td booster every 10 years.  Varicella vaccine. You may need this if you have not been vaccinated.  Zoster vaccine. You may need this after age 20.  Measles, mumps, and rubella (MMR) vaccine. You may need at least one dose of MMR if you were born in 1957 or later.  You may also need a second dose.  Pneumococcal 13-valent conjugate (PCV13) vaccine. One dose is recommended after age 73.  Pneumococcal polysaccharide (PPSV23) vaccine. One dose is recommended after age 74.  Meningococcal vaccine. You may need this if you have certain conditions.  Hepatitis A vaccine. You may need this if you have certain conditions or if you travel or work  in places where you may be exposed to hepatitis A.  Hepatitis B vaccine. You may need this if you have certain conditions or if you travel or work in places where you may be exposed to hepatitis B.  Haemophilus influenzae type b (Hib) vaccine. You may need this if you have certain conditions.  Talk to your health care provider about which screenings and vaccines you need and how often you need them. This information is not intended to replace advice given to you by your health care provider. Make sure you discuss any questions you have with your health care provider. Document Released: 01/16/2015 Document Revised: 09/09/2015 Document Reviewed: 10/21/2014 Elsevier Interactive Patient Education  [employer].

## 2017-11-15 ENCOUNTER — Other Ambulatory Visit: Payer: Self-pay | Admitting: Pediatrics

## 2017-11-15 DIAGNOSIS — E785 Hyperlipidemia, unspecified: Secondary | ICD-10-CM

## 2017-11-15 DIAGNOSIS — I10 Essential (primary) hypertension: Secondary | ICD-10-CM

## 2017-11-18 ENCOUNTER — Other Ambulatory Visit: Payer: Self-pay | Admitting: Pediatrics

## 2017-11-18 DIAGNOSIS — E785 Hyperlipidemia, unspecified: Secondary | ICD-10-CM

## 2017-11-24 ENCOUNTER — Other Ambulatory Visit: Payer: Self-pay | Admitting: Pediatrics

## 2017-11-28 DIAGNOSIS — Z1231 Encounter for screening mammogram for malignant neoplasm of breast: Secondary | ICD-10-CM | POA: Diagnosis not present

## 2017-11-28 LAB — HM MAMMOGRAPHY

## 2017-12-20 ENCOUNTER — Other Ambulatory Visit: Payer: Self-pay | Admitting: Pediatrics

## 2017-12-20 DIAGNOSIS — E785 Hyperlipidemia, unspecified: Secondary | ICD-10-CM

## 2017-12-21 NOTE — Telephone Encounter (Signed)
lmtcb to schedule appt 

## 2017-12-21 NOTE — Telephone Encounter (Signed)
[name]. NTBS 30 days given 11/27/17

## 2017-12-23 ENCOUNTER — Other Ambulatory Visit: Payer: Self-pay | Admitting: Pediatrics

## 2017-12-23 DIAGNOSIS — I10 Essential (primary) hypertension: Secondary | ICD-10-CM

## 2017-12-23 DIAGNOSIS — E785 Hyperlipidemia, unspecified: Secondary | ICD-10-CM

## 2017-12-25 MED ORDER — PANTOPRAZOLE SODIUM 40 MG PO TBEC: 40.0000 mg | DELAYED_RELEASE_TABLET | Freq: Every day | ORAL | 0 refills | Status: DC

## 2017-12-25 NOTE — Telephone Encounter (Signed)
Last lipid 05/10/16   Last seen 10/04/17

## 2017-12-25 NOTE — Telephone Encounter (Signed)
Pt aware ntbs for further refills but didn't want to schedule f/u at this time.She will call back after the holidays.

## 2017-12-25 NOTE — Addendum Note (Signed)
Addended by: Marylin Crosby on: 12/25/2017 04:52 PM   Modules accepted: Orders

## 2018-01-24 ENCOUNTER — Encounter: Payer: Self-pay | Admitting: Family Medicine

## 2018-01-24 ENCOUNTER — Ambulatory Visit (INDEPENDENT_AMBULATORY_CARE_PROVIDER_SITE_OTHER): Payer: Medicare Other | Admitting: Family Medicine

## 2018-01-24 VITALS — BP 119/65 | HR 82 | Temp 96.9°F | Ht 64.75 in | Wt 166.0 lb

## 2018-01-24 DIAGNOSIS — E785 Hyperlipidemia, unspecified: Secondary | ICD-10-CM | POA: Diagnosis not present

## 2018-01-24 DIAGNOSIS — B001 Herpesviral vesicular dermatitis: Secondary | ICD-10-CM | POA: Diagnosis not present

## 2018-01-24 DIAGNOSIS — K219 Gastro-esophageal reflux disease without esophagitis: Secondary | ICD-10-CM | POA: Diagnosis not present

## 2018-01-24 DIAGNOSIS — I10 Essential (primary) hypertension: Secondary | ICD-10-CM

## 2018-01-24 DIAGNOSIS — J3089 Other allergic rhinitis: Secondary | ICD-10-CM | POA: Diagnosis not present

## 2018-01-24 MED ORDER — PANTOPRAZOLE SODIUM 40 MG PO TBEC: 40.0000 mg | DELAYED_RELEASE_TABLET | Freq: Every day | ORAL | 3 refills | Status: DC

## 2018-01-24 MED ORDER — VALACYCLOVIR HCL 1 G PO TABS: ORAL_TABLET | ORAL | 2 refills | Status: DC

## 2018-01-24 MED ORDER — PRAVASTATIN SODIUM 40 MG PO TABS: 40.0000 mg | ORAL_TABLET | Freq: Every day | ORAL | 1 refills | Status: DC

## 2018-01-24 MED ORDER — LOSARTAN POTASSIUM 100 MG PO TABS: 100.0000 mg | ORAL_TABLET | Freq: Every day | ORAL | 1 refills | Status: DC

## 2018-01-24 MED ORDER — CHLORTHALIDONE 25 MG PO TABS: 25.0000 mg | ORAL_TABLET | Freq: Every day | ORAL | 1 refills | Status: DC

## 2018-01-24 MED ORDER — FEXOFENADINE-PSEUDOEPHED ER 60-120 MG PO TB12: 1.0000 | ORAL_TABLET | Freq: Every day | ORAL | 3 refills | Status: DC

## 2018-01-24 NOTE — Progress Notes (Signed)
Subjective:    Patient ID: Stacy Henry, female    DOB: January 16, 1945, 73 y.o.   MRN: [id number]  Chief Complaint:  Med refills and Medical Management of Chronic Issues   HPI: Stacy Henry is a 73 y.o. female presenting on 01/24/2018 for Med refills and Medical Management of Chronic Issues   1. Gastroesophageal reflux disease without esophagitis Well controlled with medications. No sore throat, cough, or hemoptysis.     2. Herpes labialis  No recent outbreaks. Only uses Valtrex as needed with breakouts.    3. Hyperlipidemia, unspecified hyperlipidemia type  Complaint with medications. Denies side effects, no myalgias. Does try to watch her diet. Does not exercise on a regular basis.    4. Essential hypertension  Complaint with meds - Yes Checking BP at home - No Exercising Regularly - No Watching Salt intake - Yes Pertinent ROS:  Headache - No Chest pain - No Dyspnea - No Palpitations - No LE edema - No They report good compliance with medications and can restate their regimen by memory. No medication side effects.  BP Readings from Last 3 Encounters:  01/24/18 119/65  10/24/17 115/72  10/04/17 135/81     5. Non-seasonal allergic rhinitis, unspecified trigger  Allegra-D daily with great results. Denies side effects.      Relevant past medical, surgical, family, and social history reviewed and updated as indicated.  Allergies and medications reviewed and updated.   Past Medical History:  Diagnosis Date  . Allergy   . GERD (gastroesophageal reflux disease)   . HTN (hypertension)   . Hyperlipidemia   . Insomnia   . Ischemic colitis ([place]) 2011  . Osteopenia     Past Surgical History:  Procedure Laterality Date  . APPENDECTOMY  as teenager  . BREAST SURGERY Bilateral    "lump"removal  . COLONOSCOPY  2006   RMR: normal.  . COLONOSCOPY  2012   [facility]: most c/w ischemic colitis, no path  . COLONOSCOPY N/A 12/03/2014   Procedure: COLONOSCOPY;  Surgeon: Daneil Dolin, MD;  Location: [facility];  Service: Endoscopy;  Laterality: N/A;  0900  . EYE SURGERY Bilateral   . FINGER SURGERY      Social History   Socioeconomic History  . Marital status: Married    Spouse name: Not on file  . Number of children: 2  . Years of education: Not on file  . Highest education level: Not on file  Occupational History  . Occupation: Electronics engineer  Social Needs  . Financial resource strain: Not on file  . Food insecurity:    Worry: Not on file    Inability: Not on file  . Transportation needs:    Medical: Not on file    Non-medical: Not on file  Tobacco Use  . Smoking status: Never Smoker  . Smokeless tobacco: Never Used  Substance and Sexual Activity  . Alcohol use: Yes    Comment: ocassional wine - less than weekly  . Drug use: No  . Sexual activity: Yes    Partners: Male  Lifestyle  . Physical activity:    Days per week: Not on file    Minutes per session: Not on file  . Stress: Not on file  Relationships  . Social connections:    Talks on phone: Not on file    Gets together: Not on file    Attends religious service: Not on file    Active member of club or  organization: Not on file    Attends meetings of clubs or organizations: Not on file    Relationship status: Not on file  . Intimate partner violence:    Fear of current or ex partner: Not on file    Emotionally abused: Not on file    Physically abused: Not on file    Forced sexual activity: Not on file  Other Topics Concern  . Not on file  Social History Narrative  . Not on file    Outpatient Encounter Medications as of 01/24/2018  Medication Sig  . b complex vitamins tablet Take 1 tablet by mouth daily.  . Biotin w/ Vitamins C & E (HAIR/SKIN/NAILS PO) Take 1 tablet by mouth daily.  . chlorthalidone (HYGROTON) 25 MG tablet Take 1 tablet (25 mg total) by mouth daily.  . fexofenadine-pseudoephedrine (ALLEGRA-D) 60-120 MG 12 hr  tablet Take 1 tablet by mouth daily.  Marland Kitchen losartan (COZAAR) 100 MG tablet Take 1 tablet (100 mg total) by mouth daily. (Needs to be seen before next refill)  . Melatonin 10 MG SUBL Place 10 mg under the tongue.  . Multiple Vitamin (MULTIVITAMIN WITH MINERALS) TABS tablet Take 1 tablet by mouth daily.  . pantoprazole (PROTONIX) 40 MG tablet Take 1 tablet (40 mg total) by mouth daily. (Needs to be seen before next refill)  . pravastatin (PRAVACHOL) 40 MG tablet Take 1 tablet (40 mg total) by mouth at bedtime.  . valACYclovir (VALTREX) 1000 MG tablet 2gm twice a day for 24h for symptoms  . [DISCONTINUED] chlorthalidone (HYGROTON) 25 MG tablet TAKE 1 TABLET ONCE A DAY  . [DISCONTINUED] fexofenadine-pseudoephedrine (ALLEGRA-D) 60-120 MG 12 hr tablet Take 1 tablet by mouth daily.  . [DISCONTINUED] losartan (COZAAR) 100 MG tablet Take 1 tablet (100 mg total) by mouth daily. (Needs to be seen before next refill)  . [DISCONTINUED] pantoprazole (PROTONIX) 40 MG tablet Take 1 tablet (40 mg total) by mouth daily. (Needs to be seen before next refill)  . [DISCONTINUED] pravastatin (PRAVACHOL) 40 MG tablet TAKE ONE TABLET AT BEDTIME  . [DISCONTINUED] valACYclovir (VALTREX) 1000 MG tablet 2gm twice a day for 24h for symptoms (Patient taking differently: Take 1,000 mg as needed by mouth. 2gm twice a day for 24h for symptoms)  . [DISCONTINUED] clobetasol cream (TEMOVATE) 1.61 % Apply 1 application topically 2 (two) times daily.  . [DISCONTINUED] silver sulfADIAZINE (SILVADENE) 1 % cream Apply 1 application topically daily.   No facility-administered encounter medications on file as of 01/24/2018.     Allergies  Allergen Reactions  . Sulfa Antibiotics     Review of Systems  Constitutional: Negative for chills, fatigue and fever.  HENT: Negative for sore throat, trouble swallowing and voice change.   Eyes: Negative for photophobia and visual disturbance.  Respiratory: Negative for cough, chest tightness and  shortness of breath.   Cardiovascular: Negative for chest pain, palpitations and leg swelling.  Gastrointestinal: Negative for abdominal pain, anal bleeding, blood in stool, constipation, diarrhea, nausea and vomiting.  Endocrine: Negative for polydipsia, polyphagia and polyuria.  Genitourinary: Negative for decreased urine volume and difficulty urinating.  Musculoskeletal: Negative for arthralgias and myalgias.  Neurological: Negative for dizziness, tremors, seizures, syncope, facial asymmetry, speech difficulty, weakness, light-headedness, numbness and headaches.  Psychiatric/Behavioral: Negative for confusion.  All other systems reviewed and are negative.       Objective:    BP 119/65   Pulse 82   Temp (!) 96.9 F (36.1 C) (Oral)   Ht 5' 4.75" (1.645 m)  Wt 166 lb (75.3 kg)   BMI 27.84 kg/m    Wt Readings from Last 3 Encounters:  01/24/18 166 lb (75.3 kg)  10/24/17 168 lb (76.2 kg)  10/04/17 167 lb (75.8 kg)    Physical Exam Vitals signs and nursing note reviewed.  Constitutional:      General: She is not in acute distress.    Appearance: Normal appearance. She is well-developed, well-groomed and overweight.  HENT:     Head: Normocephalic and atraumatic.     Right Ear: Hearing, tympanic membrane, ear canal and external ear normal.     Left Ear: Hearing, tympanic membrane, ear canal and external ear normal.     Nose: Nose normal.     Mouth/Throat:     Lips: Pink.     Mouth: Mucous membranes are moist.     Pharynx: Oropharynx is clear. Uvula midline.  Eyes:     General: Lids are normal.     Extraocular Movements: Extraocular movements intact.     Conjunctiva/sclera: Conjunctivae normal.     Pupils: Pupils are equal, round, and reactive to light.     Visual Fields: Right eye visual fields normal and left eye visual fields normal.  Neck:     Musculoskeletal: Full passive range of motion without pain and neck supple.     Thyroid: No thyroid mass, thyromegaly or  thyroid tenderness.     Vascular: No carotid bruit or JVD.     Trachea: Trachea and phonation normal.  Cardiovascular:     Rate and Rhythm: Normal rate and regular rhythm.     Pulses: Normal pulses.     Heart sounds: Normal heart sounds. No murmur. No friction rub. No gallop.   Pulmonary:     Effort: Pulmonary effort is normal.     Breath sounds: Normal breath sounds.  Abdominal:     General: Bowel sounds are normal.     Palpations: Abdomen is soft.  Lymphadenopathy:     Cervical: No cervical adenopathy.  Skin:    General: Skin is warm and dry.     Capillary Refill: Capillary refill takes less than 2 seconds.  Neurological:     General: No focal deficit present.     Mental Status: She is alert and oriented to person, place, and time.     Cranial Nerves: Cranial nerves are intact.     Sensory: Sensation is intact.     Motor: Motor function is intact.     Coordination: Coordination is intact.     Gait: Gait is intact.     Deep Tendon Reflexes: Reflexes are normal and symmetric.  Psychiatric:        Mood and Affect: Mood normal.        Behavior: Behavior normal. Behavior is cooperative.        Thought Content: Thought content normal.        Judgment: Judgment normal.     Results for orders placed or performed in visit on 12/05/17  [facility] MAMMOGRAPHY  Result Value Ref Range   HM Mammogram 0-4 Bi-Rad 0-4 Bi-Rad, Self Reported Normal       Pertinent labs & imaging results that were available during my care of the patient were reviewed by me and considered in my medical decision making.  Assessment & Plan:  Stacy Henry was seen today for med refills and medical management of chronic issues.  Diagnoses and all orders for this visit:  Gastroesophageal reflux disease without esophagitis Avoid triggering foods and beverages. Avoid  eating for at least 1 our prior to bed. Medications as prescribed.  -     pantoprazole (PROTONIX) 40 MG tablet; Take 1 tablet (40 mg total) by mouth daily.  (Needs to be seen before next refill)  Herpes labialis Medications on an as needed basis.  -     valACYclovir (VALTREX) 1000 MG tablet; 2gm twice a day for 24h for symptoms  Hyperlipidemia, unspecified hyperlipidemia type Diet and exercise encouraged. Labs pending.  -     pravastatin (PRAVACHOL) 40 MG tablet; Take 1 tablet (40 mg total) by mouth at bedtime. -     Lipid panel  Essential hypertension Diet and exercise encouraged. Labs pending.  -     losartan (COZAAR) 100 MG tablet; Take 1 tablet (100 mg total) by mouth daily. (Needs to be seen before next refill) -     chlorthalidone (HYGROTON) 25 MG tablet; Take 1 tablet (25 mg total) by mouth daily. -     CBC with Differential/Platelet -     CMP14+EGFR -     Microalbumin / creatinine urine ratio  Non-seasonal allergic rhinitis, unspecified trigger -     fexofenadine-pseudoephedrine (ALLEGRA-D) 60-120 MG 12 hr tablet; Take 1 tablet by mouth daily.     Continue all other maintenance medications.  Follow up plan: Return in about 6 months (around 07/25/2018), or if symptoms worsen or fail to improve.  Educational handout given for DASH diet  The above assessment and management plan was discussed with the patient. The patient verbalized understanding of and has agreed to the management plan. Patient is aware to call the clinic if symptoms persist or worsen. Patient is aware when to return to the clinic for a follow-up visit. Patient educated on when it is appropriate to go to the emergency department.   Monia Pouch, FNP-C [facility] [phone]

## 2018-01-24 NOTE — Patient Instructions (Signed)
DASH Eating Plan  DASH stands for "Dietary Approaches to Stop Hypertension." The DASH eating plan is a healthy eating plan that has been shown to reduce high blood pressure (hypertension). It may also reduce your risk for type 2 diabetes, heart disease, and stroke. The DASH eating plan may also help with weight loss.  What are tips for following this plan?    General guidelines   Avoid eating more than 2,300 mg (milligrams) of salt (sodium) a day. If you have hypertension, you may need to reduce your sodium intake to 1,500 mg a day.   Limit alcohol intake to no more than 1 drink a day for nonpregnant women and 2 drinks a day for men. One drink equals 12 oz of beer, 5 oz of wine, or 1 oz of hard liquor.   Work with your health care provider to maintain a healthy body weight or to lose weight. Ask what an ideal weight is for you.   Get at least 30 minutes of exercise that causes your heart to beat faster (aerobic exercise) most days of the week. Activities may include walking, swimming, or biking.   Work with your health care provider or diet and nutrition specialist (dietitian) to adjust your eating plan to your individual calorie needs.  Reading food labels     Check food labels for the amount of sodium per serving. Choose foods with less than 5 percent of the Daily Value of sodium. Generally, foods with less than 300 mg of sodium per serving fit into this eating plan.   To find whole grains, look for the word "whole" as the first word in the ingredient list.  Shopping   Buy products labeled as "low-sodium" or "no salt added."   Buy fresh foods. Avoid canned foods and premade or frozen meals.  Cooking   Avoid adding salt when cooking. Use salt-free seasonings or herbs instead of table salt or sea salt. Check with your health care provider or pharmacist before using salt substitutes.   Do not fry foods. Cook foods using healthy methods such as baking, boiling, grilling, and broiling instead.   Cook with  heart-healthy oils, such as olive, canola, soybean, or sunflower oil.  Meal planning   Eat a balanced diet that includes:  ? 5 or more servings of fruits and vegetables each day. At each meal, try to fill half of your plate with fruits and vegetables.  ? Up to 6-8 servings of whole grains each day.  ? Less than 6 oz of lean meat, poultry, or fish each day. A 3-oz serving of meat is about the same size as a deck of cards. One egg equals 1 oz.  ? 2 servings of low-fat dairy each day.  ? A serving of nuts, seeds, or beans 5 times each week.  ? Heart-healthy fats. Healthy fats called Omega-3 fatty acids are found in foods such as flaxseeds and coldwater fish, like sardines, salmon, and mackerel.   Limit how much you eat of the following:  ? Canned or prepackaged foods.  ? Food that is high in trans fat, such as fried foods.  ? Food that is high in saturated fat, such as fatty meat.  ? Sweets, desserts, sugary drinks, and other foods with added sugar.  ? Full-fat dairy products.   Do not salt foods before eating.   Try to eat at least 2 vegetarian meals each week.   Eat more home-cooked food and less restaurant, buffet, and fast food.     When eating at a restaurant, ask that your food be prepared with less salt or no salt, if possible.  What foods are recommended?  The items listed may not be a complete list. Talk with your dietitian about what dietary choices are best for you.  Grains  Whole-grain or whole-wheat bread. Whole-grain or whole-wheat pasta. Stegemann rice. Oatmeal. Quinoa. Bulgur. Whole-grain and low-sodium cereals. Pita bread. Low-fat, low-sodium crackers. Whole-wheat flour tortillas.  Vegetables  Fresh or frozen vegetables (raw, steamed, roasted, or grilled). Low-sodium or reduced-sodium tomato and vegetable juice. Low-sodium or reduced-sodium tomato sauce and tomato paste. Low-sodium or reduced-sodium canned vegetables.  Fruits  All fresh, dried, or frozen fruit. Canned fruit in natural juice (without  added sugar).  Meat and other protein foods  Skinless chicken or turkey. Ground chicken or turkey. Pork with fat trimmed off. Fish and seafood. Egg whites. Dried beans, peas, or lentils. Unsalted nuts, nut butters, and seeds. Unsalted canned beans. Lean cuts of beef with fat trimmed off. Low-sodium, lean deli meat.  Dairy  Low-fat (1%) or fat-free (skim) milk. Fat-free, low-fat, or reduced-fat cheeses. Nonfat, low-sodium ricotta or cottage cheese. Low-fat or nonfat yogurt. Low-fat, low-sodium cheese.  Fats and oils  Soft margarine without trans fats. Vegetable oil. Low-fat, reduced-fat, or light mayonnaise and salad dressings (reduced-sodium). Canola, safflower, olive, soybean, and sunflower oils. Avocado.  Seasoning and other foods  Herbs. Spices. Seasoning mixes without salt. Unsalted popcorn and pretzels. Fat-free sweets.  What foods are not recommended?  The items listed may not be a complete list. Talk with your dietitian about what dietary choices are best for you.  Grains  Baked goods made with fat, such as croissants, muffins, or some breads. Dry pasta or rice meal packs.  Vegetables  Creamed or fried vegetables. Vegetables in a cheese sauce. Regular canned vegetables (not low-sodium or reduced-sodium). Regular canned tomato sauce and paste (not low-sodium or reduced-sodium). Regular tomato and vegetable juice (not low-sodium or reduced-sodium). Pickles. Olives.  Fruits  Canned fruit in a light or heavy syrup. Fried fruit. Fruit in cream or butter sauce.  Meat and other protein foods  Fatty cuts of meat. Ribs. Fried meat. Bacon. Sausage. Bologna and other processed lunch meats. Salami. Fatback. Hotdogs. Bratwurst. Salted nuts and seeds. Canned beans with added salt. Canned or smoked fish. Whole eggs or egg yolks. Chicken or turkey with skin.  Dairy  Whole or 2% milk, cream, and half-and-half. Whole or full-fat cream cheese. Whole-fat or sweetened yogurt. Full-fat cheese. Nondairy creamers. Whipped toppings.  Processed cheese and cheese spreads.  Fats and oils  Butter. Stick margarine. Lard. Shortening. Ghee. Bacon fat. Tropical oils, such as coconut, palm kernel, or palm oil.  Seasoning and other foods  Salted popcorn and pretzels. Onion salt, garlic salt, seasoned salt, table salt, and sea salt. Worcestershire sauce. Tartar sauce. Barbecue sauce. Teriyaki sauce. Soy sauce, including reduced-sodium. Steak sauce. Canned and packaged gravies. Fish sauce. Oyster sauce. Cocktail sauce. Horseradish that you find on the shelf. Ketchup. Mustard. Meat flavorings and tenderizers. Bouillon cubes. Hot sauce and Tabasco sauce. Premade or packaged marinades. Premade or packaged taco seasonings. Relishes. Regular salad dressings.  Where to find more information:   National Heart, Lung, and Blood Institute: www.nhlbi.nih.gov   American Heart Association: www.heart.org  Summary   The DASH eating plan is a healthy eating plan that has been shown to reduce high blood pressure (hypertension). It may also reduce your risk for type 2 diabetes, heart disease, and stroke.   With the   DASH eating plan, you should limit salt (sodium) intake to 2,300 mg a day. If you have hypertension, you may need to reduce your sodium intake to 1,500 mg a day.   When on the DASH eating plan, aim to eat more fresh fruits and vegetables, whole grains, lean proteins, low-fat dairy, and heart-healthy fats.   Work with your health care provider or diet and nutrition specialist (dietitian) to adjust your eating plan to your individual calorie needs.  This information is not intended to replace advice given to you by your health care provider. Make sure you discuss any questions you have with your health care provider.  Document Released: 12/09/2010 Document Revised: 12/14/2015 Document Reviewed: 12/14/2015  Elsevier Interactive Patient Education  2019 [employer] Inc[employer]

## 2018-01-25 ENCOUNTER — Encounter: Payer: Self-pay | Admitting: Family Medicine

## 2018-01-25 DIAGNOSIS — N183 Chronic kidney disease, stage 3 (moderate): Secondary | ICD-10-CM

## 2018-01-25 DIAGNOSIS — N1832 Chronic kidney disease, stage 3b: Secondary | ICD-10-CM | POA: Insufficient documentation

## 2018-01-25 LAB — CBC WITH DIFFERENTIAL/PLATELET
Basophils Absolute: 0 10*3/uL (ref 0.0–0.2)
Basos: 0 %
EOS (ABSOLUTE): 0.3 10*3/uL (ref 0.0–0.4)
Eos: 3 %
Hematocrit: 39.4 % (ref 34.0–46.6)
Hemoglobin: 13 g/dL (ref 11.1–15.9)
Immature Grans (Abs): 0 10*3/uL (ref 0.0–0.1)
Immature Granulocytes: 0 %
Lymphocytes Absolute: 4.1 10*3/uL — ABNORMAL HIGH (ref 0.7–3.1)
Lymphs: 45 %
MCH: 29 pg (ref 26.6–33.0)
MCHC: 33 g/dL (ref 31.5–35.7)
MCV: 88 fL (ref 79–97)
MONOS ABS: 1 10*3/uL — AB (ref 0.1–0.9)
Monocytes: 11 %
Neutrophils Absolute: 3.8 10*3/uL (ref 1.4–7.0)
Neutrophils: 41 %
Platelets: 296 10*3/uL (ref 150–450)
RBC: 4.48 x10E6/uL (ref 3.77–5.28)
RDW: 13 % (ref 11.7–15.4)
WBC: 9.3 10*3/uL (ref 3.4–10.8)

## 2018-01-25 LAB — CMP14+EGFR
A/G RATIO: 1.4 (ref 1.2–2.2)
ALT: 17 IU/L (ref 0–32)
AST: 25 IU/L (ref 0–40)
Albumin: 4.4 g/dL (ref 3.7–4.7)
Alkaline Phosphatase: 64 IU/L (ref 39–117)
BILIRUBIN TOTAL: 0.5 mg/dL (ref 0.0–1.2)
BUN/Creatinine Ratio: 23 (ref 12–28)
BUN: 29 mg/dL — ABNORMAL HIGH (ref 8–27)
CHLORIDE: 102 mmol/L (ref 96–106)
CO2: 23 mmol/L (ref 20–29)
Calcium: 9.7 mg/dL (ref 8.7–10.3)
Creatinine, Ser: 1.24 mg/dL — ABNORMAL HIGH (ref 0.57–1.00)
GFR calc Af Amer: 50 mL/min/{1.73_m2} — ABNORMAL LOW (ref >59)
GFR calc non Af Amer: 43 mL/min/{1.73_m2} — ABNORMAL LOW (ref >59)
Globulin, Total: 3.1 g/dL (ref 1.5–4.5)
Glucose: 77 mg/dL (ref 65–99)
POTASSIUM: 4.2 mmol/L (ref 3.5–5.2)
SODIUM: 141 mmol/L (ref 134–144)
Total Protein: 7.5 g/dL (ref 6.0–8.5)

## 2018-01-25 LAB — MICROALBUMIN / CREATININE URINE RATIO
Creatinine, Urine: 114.2 mg/dL
Microalb/Creat Ratio: 5 mg/g creat (ref 0–29)
Microalbumin, Urine: 5.7 ug/mL

## 2018-01-25 LAB — LIPID PANEL
Chol/HDL Ratio: 2.9 ratio (ref 0.0–4.4)
Cholesterol, Total: 152 mg/dL (ref 100–199)
HDL: 52 mg/dL (ref >39)
LDL Calculated: 78 mg/dL (ref 0–99)
Triglycerides: 109 mg/dL (ref 0–149)
VLDL Cholesterol Cal: 22 mg/dL (ref 5–40)

## 2018-02-20 DIAGNOSIS — L900 Lichen sclerosus et atrophicus: Secondary | ICD-10-CM | POA: Diagnosis not present

## 2018-02-20 DIAGNOSIS — L570 Actinic keratosis: Secondary | ICD-10-CM | POA: Diagnosis not present

## 2018-02-20 DIAGNOSIS — C44319 Basal cell carcinoma of skin of other parts of face: Secondary | ICD-10-CM | POA: Diagnosis not present

## 2018-03-14 DIAGNOSIS — C44319 Basal cell carcinoma of skin of other parts of face: Secondary | ICD-10-CM | POA: Diagnosis not present

## 2018-04-03 ENCOUNTER — Ambulatory Visit (INDEPENDENT_AMBULATORY_CARE_PROVIDER_SITE_OTHER): Payer: Medicare Other | Admitting: Nurse Practitioner

## 2018-04-03 ENCOUNTER — Other Ambulatory Visit: Payer: Self-pay

## 2018-04-03 ENCOUNTER — Encounter: Payer: Self-pay | Admitting: Nurse Practitioner

## 2018-04-03 DIAGNOSIS — H5350 Unspecified color vision deficiencies: Secondary | ICD-10-CM | POA: Diagnosis not present

## 2018-04-03 NOTE — Progress Notes (Signed)
Patient ID: Stacy Henry, female   DOB: 10-26-1945, 73 y.o.   MRN: [id number]    Virtual Visit via telephone Note  I connected with Stacy Henry on 04/03/18 at 4:25 PM by telephone and verified that I am speaking with the correct person using two identifiers. Stacy Henry is currently located at home and no one is currently with her during visit. The provider, Mary-Margaret Hassell Done, FNP is located in their office at time of visit.  I discussed the limitations, risks, security and privacy concerns of performing an evaluation and management service by telephone and the availability of in person appointments. I also discussed with the patient that there may be a patient responsible charge related to this service. The patient expressed understanding and agreed to proceed.   History and Present Illness:   Chief Complaint: visual changes  HPI Patient was mowing her yard Friday and she had a 15 min episode of vision becoming like a bright light. This is the 4th time this has occurred in the last  Months. She has also been fatigued with SOB for over a year. Saw cardiology and was cleared. She contacted eye doctor and they said to cntact [place].    Review of Systems  Constitutional: Negative.   HENT: Negative.   Eyes:       Visula changes  Gastrointestinal: Negative.   Skin: Negative.   Neurological: Positive for headaches (occasional). Negative for tingling, sensory change, speech change and focal weakness.  All other systems reviewed and are negative.      Observations/Objective: Alert and oriented- answers all questions appropriately  Assessment and Plan: Stacy Henry in today with chief complaint of visiual changes    1. Visual color changes Orders Placed This Encounter  Procedures  . Ambulatory referral to Neurology    Referral Priority:   Routine    Referral Type:   Consultation    Referral Reason:   Specialty Services Required    Requested  Specialty:   Neurology    Number of Visits Requested:   1   Keep diar y of events   Follow Up Instructions: prn    I discussed the assessment and treatment plan with the patient. The patient was provided an opportunity to ask questions and all were answered. The patient agreed with the plan and demonstrated an understanding of the instructions.   The patient was advised to call back or seek an in-person evaluation if the symptoms worsen or if the condition fails to improve as anticipated.  The above assessment and management plan was discussed with the patient. The patient verbalized understanding of and has agreed to the management plan. Patient is aware to call the clinic if symptoms persist or worsen. Patient is aware when to return to the clinic for a follow-up visit. Patient educated on when it is appropriate to go to the emergency department.    I provided 7  minutes of non-face-to-face time during this encounter.    Mary-Margaret Hassell Done, FNP

## 2018-04-05 ENCOUNTER — Encounter: Payer: Self-pay | Admitting: Family Medicine

## 2018-04-05 DIAGNOSIS — C44319 Basal cell carcinoma of skin of other parts of face: Secondary | ICD-10-CM | POA: Insufficient documentation

## 2018-06-18 DIAGNOSIS — Z79899 Other long term (current) drug therapy: Secondary | ICD-10-CM | POA: Diagnosis not present

## 2018-06-18 DIAGNOSIS — I10 Essential (primary) hypertension: Secondary | ICD-10-CM | POA: Diagnosis not present

## 2018-06-18 DIAGNOSIS — J302 Other seasonal allergic rhinitis: Secondary | ICD-10-CM | POA: Diagnosis not present

## 2018-06-18 DIAGNOSIS — M25512 Pain in left shoulder: Secondary | ICD-10-CM | POA: Diagnosis not present

## 2018-06-18 DIAGNOSIS — R079 Chest pain, unspecified: Secondary | ICD-10-CM | POA: Diagnosis not present

## 2018-06-18 DIAGNOSIS — E7800 Pure hypercholesterolemia, unspecified: Secondary | ICD-10-CM | POA: Diagnosis not present

## 2018-06-18 DIAGNOSIS — R0789 Other chest pain: Secondary | ICD-10-CM | POA: Diagnosis not present

## 2018-06-19 ENCOUNTER — Other Ambulatory Visit: Payer: Self-pay

## 2018-06-20 ENCOUNTER — Encounter: Payer: Self-pay | Admitting: Family Medicine

## 2018-06-20 ENCOUNTER — Ambulatory Visit (INDEPENDENT_AMBULATORY_CARE_PROVIDER_SITE_OTHER): Payer: Medicare Other | Admitting: Family Medicine

## 2018-06-20 VITALS — BP 138/85 | HR 76 | Temp 99.0°F | Ht 65.0 in | Wt 164.0 lb

## 2018-06-20 DIAGNOSIS — N183 Chronic kidney disease, stage 3 (moderate): Secondary | ICD-10-CM | POA: Diagnosis not present

## 2018-06-20 DIAGNOSIS — R079 Chest pain, unspecified: Secondary | ICD-10-CM | POA: Diagnosis not present

## 2018-06-20 DIAGNOSIS — M546 Pain in thoracic spine: Secondary | ICD-10-CM | POA: Diagnosis not present

## 2018-06-20 DIAGNOSIS — N1832 Chronic kidney disease, stage 3b: Secondary | ICD-10-CM

## 2018-06-20 MED ORDER — METHYLPREDNISOLONE ACETATE 40 MG/ML IJ SUSP
40.0000 mg | Freq: Once | INTRAMUSCULAR | Status: AC
  Administered 2018-06-20: 40 mg via INTRAMUSCULAR

## 2018-06-20 MED ORDER — PREDNISONE 10 MG (21) PO TBPK: ORAL_TABLET | ORAL | 0 refills | Status: DC

## 2018-06-20 NOTE — Patient Instructions (Signed)
I am ruling out a possible blood clot given left-sided chest pain.  I think this is low risk but given your ongoing pain that is refractory to the medications you have been prescribed we will rule this out.  I have replaced her Naprosyn with prednisone.  You were given a shot version of this today.  You may start the prednisone with breakfast tomorrow if needed.  You may continue your muscle relaxer.  If no significant improvement, I want you to contact our office for further instruction.   Thoracic Strain  Thoracic strain is an injury to the muscles or tendons that attach to the upper back. A strain can be mild or severe. A mild strain may take only 1-2 weeks to heal. A severe strain involves torn muscles or tendons, so it may take 6-8 weeks to heal. Follow these instructions at home:  Rest as needed. Limit your activity as told by your doctor.  If directed, put ice on the injured area: ? Put ice in a plastic bag. ? Place a towel between your skin and the bag. ? Leave the ice on for 20 minutes, 2-3 times per day.  Take over-the-counter and prescription medicines only as told by your doctor.  Begin doing exercises as told by your doctor or physical therapist.  Warm up before being active.  Bend your knees before you lift heavy objects.  Keep all follow-up visits as told by your doctor. This is important. Contact a doctor if:  Your pain is not helped by medicine.  Your pain, bruising, or swelling is getting worse.  You have a fever. Get help right away if:  You have shortness of breath.  You have chest pain.  You have weakness or loss of feeling (numbness) in your legs.  You cannot control when you pee (urinate). This information is not intended to replace advice given to you by your health care provider. Make sure you discuss any questions you have with your health care provider. Document Released: 06/08/2007 Document Revised: 08/22/2015 Document Reviewed: 02/13/2014  Elsevier Interactive Patient Education  2019 [employer].

## 2018-06-20 NOTE — Progress Notes (Signed)
Subjective: CC: back pain PCP: Baruch Gouty, FNP HPI: Patient is a 73 y.o. female presenting to clinic today for back pain. Concerns today include:  1. Back Pain Patient reports that pain began several days ago.  She was actually evaluated in the emergency department on Monday because the pain was left-sided and her husband was worried about possible heart attack.  No h/o back pain.  Pain is getting somewhat better than onset but is still quite significant.  It does radiate to the left anterior ribs.  Certain positions worsens pain.  The muscle relaxer and NSAID that was given to her helps take the edge off but does not fully relieve. Patient denies trauma or injury.  No previous change in activity.  No hemoptysis, lower extremity swelling or immobility.  No shortness of breath or wheeze.  Pain is little more noticeable with deep breathing.   Current Outpatient Medications:  .  b complex vitamins tablet, Take 1 tablet by mouth daily., Disp: , Rfl:  .  Biotin w/ Vitamins C & E (HAIR/SKIN/NAILS PO), Take 1 tablet by mouth daily., Disp: , Rfl:  .  chlorthalidone (HYGROTON) 25 MG tablet, Take 1 tablet (25 mg total) by mouth daily., Disp: 90 tablet, Rfl: 1 .  cyclobenzaprine (FLEXERIL) 10 MG tablet, Take by mouth., Disp: , Rfl:  .  fexofenadine-pseudoephedrine (ALLEGRA-D) 60-120 MG 12 hr tablet, Take 1 tablet by mouth daily., Disp: 90 tablet, Rfl: 3 .  losartan (COZAAR) 100 MG tablet, Take 1 tablet (100 mg total) by mouth daily. (Needs to be seen before next refill), Disp: 90 tablet, Rfl: 1 .  Melatonin 10 MG SUBL, Place 10 mg under the tongue., Disp: , Rfl:  .  Multiple Vitamin (MULTIVITAMIN WITH MINERALS) TABS tablet, Take 1 tablet by mouth daily., Disp: , Rfl:  .  naproxen (NAPROSYN) 375 MG tablet, Take by mouth., Disp: , Rfl:  .  pantoprazole (PROTONIX) 40 MG tablet, Take 1 tablet (40 mg total) by mouth daily. (Needs to be seen before next refill), Disp: 90 tablet, Rfl: 3 .  pravastatin  (PRAVACHOL) 40 MG tablet, Take 1 tablet (40 mg total) by mouth at bedtime., Disp: 90 tablet, Rfl: 1 .  valACYclovir (VALTREX) 1000 MG tablet, 2gm twice a day for 24h for symptoms, Disp: 20 tablet, Rfl: 2 Allergies  Allergen Reactions  . Sulfa Antibiotics     Past Medical History:  Diagnosis Date  . Allergy   . GERD (gastroesophageal reflux disease)   . HTN (hypertension)   . Hyperlipidemia   . Insomnia   . Ischemic colitis ([place]) 2011  . Osteopenia    Social History   Socioeconomic History  . Marital status: Married    Spouse name: Not on file  . Number of children: 2  . Years of education: Not on file  . Highest education level: Not on file  Occupational History  . Occupation: Electronics engineer  Social Needs  . Financial resource strain: Not on file  . Food insecurity    Worry: Not on file    Inability: Not on file  . Transportation needs    Medical: Not on file    Non-medical: Not on file  Tobacco Use  . Smoking status: Never Smoker  . Smokeless tobacco: Never Used  Substance and Sexual Activity  . Alcohol use: Yes    Comment: ocassional wine - less than weekly  . Drug use: No  . Sexual activity: Yes    Partners: Male  Lifestyle  .  Physical activity    Days per week: Not on file    Minutes per session: Not on file  . Stress: Not on file  Relationships  . Social Herbalist on phone: Not on file    Gets together: Not on file    Attends religious service: Not on file    Active member of club or organization: Not on file    Attends meetings of clubs or organizations: Not on file    Relationship status: Not on file  . Intimate partner violence    Fear of current or ex partner: Not on file    Emotionally abused: Not on file    Physically abused: Not on file    Forced sexual activity: Not on file  Other Topics Concern  . Not on file  Social History Narrative  . Not on file   Past Surgical History:  Procedure Laterality Date  . APPENDECTOMY   as teenager  . BREAST SURGERY Bilateral    "lump"removal  . COLONOSCOPY  2006   RMR: normal.  . COLONOSCOPY  2012   [facility]: most c/w ischemic colitis, no path  . COLONOSCOPY N/A 12/03/2014   Procedure: COLONOSCOPY;  Surgeon: Daneil Dolin, MD;  Location: [facility];  Service: Endoscopy;  Laterality: N/A;  0900  . EYE SURGERY Bilateral   . FINGER SURGERY      ROS: per HPI  Objective: Office vital signs reviewed. BP 138/85   Pulse 76   Temp 99 F (37.2 C) (Oral)   Ht 5\' 5"  (1.651 m)   Wt 164 lb (74.4 kg)   BMI 27.29 kg/m   Physical Examination:  General: Awake, alert, well nourished, NAD Cardio: Regular rate and rhythm, S1S2 heard, no murmurs appreciated Pulm: Clear to auscultation bilaterally, no wheezes, rhonchi or rales Extremities: Warm, well-perfused. No edema, cyanosis or clubbing; +2 pulses bilaterally GI: Flat, soft, nontender, specifically no epigastric tenderness to palpation.  No palpable abdominal masses. MSK: normal gait and station  Thoracic Spine: full AROM, no midline tenderness to palpation, + left sided paraspinal tenderness to palpation along the T2-T6 levels.  No palpable bony deformities.  She has full active range of motion of bilateral upper extremities Neuro: 5/5 upper extremity strength; upper extremity light touch sensation grossly intact  Assessment/ Plan: Stacy Henry is a 73 y.o. female here with  1. Acute left-sided thoracic back pain Given ongoing symptoms and what sounds to be somewhat pleuritic chest pain I will order a d-dimer.  I did review her emergency department note and I do not find that this was ordered.  Chest x-ray was normal per the report.  Of note, lipase was noted to be elevated at 110 in the emergency department.  Her exam though demonstrated no epigastric tenderness to palpation.  If her symptoms do not resolve with current therapies with have low threshold to obtain abdominal imaging to further  evaluate.  Since she is not responding to the NSAID and has a history of CKD I would like her to discontinue the Naprosyn and ibuprofen.  Instead we will use prednisone for anti-inflammatory.  Okay to continue ice and stretching.  Okay to continue muscle relaxer she finds this helpful.  She was given a dose of Depo-Medrol intramuscularly and she will start the prednisone tomorrow morning. - D-dimer, quantitative (not at [facility]) - methylPREDNISolone acetate (DEPO-MEDROL) injection 40 mg - predniSONE (STERAPRED UNI-PAK 21 TAB) 10 MG (21) TBPK tablet; As directed x  6 days  Dispense: 21 tablet; Refill: 0  2. Left-sided chest pain ACS was ruled out the emergency department - D-dimer, quantitative (not at [facility])  3. Chronic kidney disease (CKD) stage G3b/A1, moderately decreased glomerular filtration rate (GFR) between 30-44 mL/min/1.73 square meter and albuminuria creatinine ratio less than 30 mg/g (HCC) Advised against oral NSAIDs.  Check BMP given recent NSAID use.  Her creatinine at the emergency department was 1.29.   - Basic Metabolic Panel   Janora Norlander, DO [place]

## 2018-06-21 LAB — BASIC METABOLIC PANEL
BUN/Creatinine Ratio: 20 (ref 12–28)
BUN: 28 mg/dL — ABNORMAL HIGH (ref 8–27)
CO2: 25 mmol/L (ref 20–29)
Calcium: 9.2 mg/dL (ref 8.7–10.3)
Chloride: 106 mmol/L (ref 96–106)
Creatinine, Ser: 1.39 mg/dL — ABNORMAL HIGH (ref 0.57–1.00)
GFR calc Af Amer: 43 mL/min/{1.73_m2} — ABNORMAL LOW (ref >59)
GFR calc non Af Amer: 37 mL/min/{1.73_m2} — ABNORMAL LOW (ref >59)
Glucose: 85 mg/dL (ref 65–99)
Potassium: 4.1 mmol/L (ref 3.5–5.2)
Sodium: 144 mmol/L (ref 134–144)

## 2018-06-21 LAB — D-DIMER, QUANTITATIVE: D-DIMER: 0.64 mg/L FEU — ABNORMAL HIGH (ref 0.00–0.49)

## 2018-07-11 ENCOUNTER — Other Ambulatory Visit: Payer: Self-pay | Admitting: Family Medicine

## 2018-07-11 DIAGNOSIS — E785 Hyperlipidemia, unspecified: Secondary | ICD-10-CM

## 2018-07-25 ENCOUNTER — Other Ambulatory Visit: Payer: Self-pay

## 2018-07-26 ENCOUNTER — Ambulatory Visit (INDEPENDENT_AMBULATORY_CARE_PROVIDER_SITE_OTHER): Payer: Medicare Other | Admitting: Family Medicine

## 2018-07-26 ENCOUNTER — Encounter: Payer: Self-pay | Admitting: Family Medicine

## 2018-07-26 VITALS — BP 117/78 | HR 87 | Temp 97.8°F | Ht 65.0 in | Wt 162.0 lb

## 2018-07-26 DIAGNOSIS — F5102 Adjustment insomnia: Secondary | ICD-10-CM | POA: Diagnosis not present

## 2018-07-26 DIAGNOSIS — I10 Essential (primary) hypertension: Secondary | ICD-10-CM

## 2018-07-26 DIAGNOSIS — N1832 Chronic kidney disease, stage 3b: Secondary | ICD-10-CM

## 2018-07-26 DIAGNOSIS — I129 Hypertensive chronic kidney disease with stage 1 through stage 4 chronic kidney disease, or unspecified chronic kidney disease: Secondary | ICD-10-CM | POA: Insufficient documentation

## 2018-07-26 DIAGNOSIS — K219 Gastro-esophageal reflux disease without esophagitis: Secondary | ICD-10-CM

## 2018-07-26 DIAGNOSIS — E782 Mixed hyperlipidemia: Secondary | ICD-10-CM

## 2018-07-26 DIAGNOSIS — N183 Chronic kidney disease, stage 3 (moderate): Secondary | ICD-10-CM | POA: Diagnosis not present

## 2018-07-26 MED ORDER — PRAVASTATIN SODIUM 40 MG PO TABS: 40.0000 mg | ORAL_TABLET | Freq: Every day | ORAL | 1 refills | Status: DC

## 2018-07-26 MED ORDER — LOSARTAN POTASSIUM 100 MG PO TABS: 100.0000 mg | ORAL_TABLET | Freq: Every day | ORAL | 1 refills | Status: DC

## 2018-07-26 MED ORDER — TRAZODONE HCL 50 MG PO TABS: 25.0000 mg | ORAL_TABLET | Freq: Every day | ORAL | 3 refills | Status: DC

## 2018-07-26 MED ORDER — PANTOPRAZOLE SODIUM 40 MG PO TBEC: 40.0000 mg | DELAYED_RELEASE_TABLET | Freq: Every day | ORAL | 3 refills | Status: DC

## 2018-07-26 MED ORDER — CHLORTHALIDONE 25 MG PO TABS: 25.0000 mg | ORAL_TABLET | Freq: Every day | ORAL | 1 refills | Status: DC

## 2018-07-26 NOTE — Patient Instructions (Signed)

## 2018-07-26 NOTE — Progress Notes (Signed)
Subjective:  Patient ID: Stacy Henry, female    DOB: 07-Sep-1945, 73 y.o.   MRN: [id number]  Patient Care Team: Baruch Gouty, FNP as PCP - General (Family Medicine) Burnell Blanks, MD as PCP - Cardiology (Cardiology) Druscilla Brownie, MD as Consulting Physician (Dermatology) Gala Romney Cristopher Estimable, MD as Consulting Physician (Gastroenterology)   Chief Complaint:  Medical Management of Chronic Issues (6 mo ), Hyperlipidemia, and Hypertension   HPI: Stacy Henry is a 73 y.o. female presenting on 07/26/2018 for Medical Management of Chronic Issues (6 mo ), Hyperlipidemia, and Hypertension   1. Essential hypertension  Complaint with meds - Yes Checking BP at home - No Exercising Regularly - Yes Watching Salt intake - Yes Pertinent ROS:  Headache - No Chest pain - No Dyspnea - No Palpitations - No LE edema - No They report good compliance with medications and can restate their regimen by memory. No medication side effects.  BP Readings from Last 3 Encounters:  07/26/18 117/78  06/20/18 138/85  01/24/18 119/65     2. Mixed hyperlipidemia  Compliant with medications without associated side effects. Does try to watch diet. Is very active daily.    3. Chronic kidney disease (CKD) stage G3b/A1, moderately decreased glomerular filtration rate (GFR) between 30-44 mL/min/1.73 square meter and albuminuria creatinine ratio less than 30 mg/g (HCC)  No decrease in urination. No swelling or confusion.    4. Gastroesophageal reflux disease without esophagitis  Doing well on current medications. No cough, dysphagia, voice change, hemoptysis, sore throat, melena, or hematochezia.    5. Insomnia due to stress  Pt states she has been having trouble sleeping since retiring in 2013. Pt states she has been using melatonin. States it was working well until the last few months.      Relevant past medical, surgical, family, and social history reviewed and updated as  indicated.  Allergies and medications reviewed and updated. Date reviewed: Chart in Epic.   Past Medical History:  Diagnosis Date  . Allergy   . GERD (gastroesophageal reflux disease)   . HTN (hypertension)   . Hyperlipidemia   . Insomnia   . Ischemic colitis ([place]) 2011  . Osteopenia     Past Surgical History:  Procedure Laterality Date  . APPENDECTOMY  as teenager  . BREAST SURGERY Bilateral    "lump"removal  . COLONOSCOPY  2006   RMR: normal.  . COLONOSCOPY  2012   [facility]: most c/w ischemic colitis, no path  . COLONOSCOPY N/A 12/03/2014   Procedure: COLONOSCOPY;  Surgeon: Daneil Dolin, MD;  Location: [facility];  Service: Endoscopy;  Laterality: N/A;  0900  . EYE SURGERY Bilateral   . FINGER SURGERY      Social History   Socioeconomic History  . Marital status: Married    Spouse name: Not on file  . Number of children: 2  . Years of education: Not on file  . Highest education level: Not on file  Occupational History  . Occupation: Electronics engineer  Social Needs  . Financial resource strain: Not on file  . Food insecurity    Worry: Not on file    Inability: Not on file  . Transportation needs    Medical: Not on file    Non-medical: Not on file  Tobacco Use  . Smoking status: Never Smoker  . Smokeless tobacco: Never Used  Substance and Sexual Activity  . Alcohol use: Yes    Comment: ocassional  wine - less than weekly  . Drug use: No  . Sexual activity: Yes    Partners: Male  Lifestyle  . Physical activity    Days per week: Not on file    Minutes per session: Not on file  . Stress: Not on file  Relationships  . Social Herbalist on phone: Not on file    Gets together: Not on file    Attends religious service: Not on file    Active member of club or organization: Not on file    Attends meetings of clubs or organizations: Not on file    Relationship status: Not on file  . Intimate partner violence    Fear of  current or ex partner: Not on file    Emotionally abused: Not on file    Physically abused: Not on file    Forced sexual activity: Not on file  Other Topics Concern  . Not on file  Social History Narrative  . Not on file    Outpatient Encounter Medications as of 07/26/2018  Medication Sig  . b complex vitamins tablet Take 1 tablet by mouth daily.  . Biotin w/ Vitamins C & E (HAIR/SKIN/NAILS PO) Take 1 tablet by mouth daily.  . chlorthalidone (HYGROTON) 25 MG tablet Take 1 tablet (25 mg total) by mouth daily.  Marland Kitchen losartan (COZAAR) 100 MG tablet Take 1 tablet (100 mg total) by mouth daily. (Needs to be seen before next refill)  . Melatonin 10 MG SUBL Place 10 mg under the tongue.  . pantoprazole (PROTONIX) 40 MG tablet Take 1 tablet (40 mg total) by mouth daily. (Needs to be seen before next refill)  . pravastatin (PRAVACHOL) 40 MG tablet Take 1 tablet (40 mg total) by mouth at bedtime.  . [DISCONTINUED] chlorthalidone (HYGROTON) 25 MG tablet Take 1 tablet (25 mg total) by mouth daily.  . [DISCONTINUED] losartan (COZAAR) 100 MG tablet Take 1 tablet (100 mg total) by mouth daily. (Needs to be seen before next refill)  . [DISCONTINUED] pantoprazole (PROTONIX) 40 MG tablet Take 1 tablet (40 mg total) by mouth daily. (Needs to be seen before next refill)  . [DISCONTINUED] pravastatin (PRAVACHOL) 40 MG tablet TAKE ONE TABLET AT BEDTIME  . fexofenadine-pseudoephedrine (ALLEGRA-D) 60-120 MG 12 hr tablet Take 1 tablet by mouth daily. (Patient not taking: Reported on 07/26/2018)  . traZODone (DESYREL) 50 MG tablet Take 0.5 tablets (25 mg total) by mouth at bedtime.  . valACYclovir (VALTREX) 1000 MG tablet 2gm twice a day for 24h for symptoms (Patient not taking: Reported on 07/26/2018)  . [DISCONTINUED] Multiple Vitamin (MULTIVITAMIN WITH MINERALS) TABS tablet Take 1 tablet by mouth daily.  . [DISCONTINUED] naproxen (NAPROSYN) 375 MG tablet Take by mouth.  . [DISCONTINUED] predniSONE (STERAPRED UNI-PAK  21 TAB) 10 MG (21) TBPK tablet As directed x 6 days   No facility-administered encounter medications on file as of 07/26/2018.     Allergies  Allergen Reactions  . Sulfa Antibiotics     Review of Systems  Constitutional: Negative for activity change, appetite change, chills, diaphoresis, fatigue, fever and unexpected weight change.  HENT: Negative for sore throat, trouble swallowing and voice change.   Eyes: Negative for photophobia and visual disturbance.  Respiratory: Negative for apnea, cough, choking, chest tightness, shortness of breath, wheezing and stridor.   Cardiovascular: Negative for chest pain, palpitations and leg swelling.  Gastrointestinal: Negative for abdominal distention, abdominal pain, anal bleeding, blood in stool, constipation, diarrhea, nausea, rectal pain and  vomiting.  Genitourinary: Negative for decreased urine volume, difficulty urinating and hematuria.  Musculoskeletal: Negative for arthralgias and myalgias.  Skin: Negative for color change and pallor.  Neurological: Negative for dizziness, tremors, seizures, syncope, facial asymmetry, speech difficulty, weakness, light-headedness, numbness and headaches.  Hematological: Does not bruise/bleed easily.  Psychiatric/Behavioral: Positive for sleep disturbance.  All other systems reviewed and are negative.       Objective:  BP 117/78   Pulse 87   Temp 97.8 F (36.6 C)   Ht _0  (1.651 m)   Wt 162 lb (73.5 kg)   BMI 26.96 kg/m    Wt Readings from Last 3 Encounters:  07/26/18 162 lb (73.5 kg)  06/20/18 164 lb (74.4 kg)  01/24/18 166 lb (75.3 kg)    Physical Exam Vitals signs and nursing note reviewed.  Constitutional:      General: She is not in acute distress.    Appearance: Normal appearance. She is well-developed and well-groomed. She is not ill-appearing, toxic-appearing or diaphoretic.  HENT:     Head: Normocephalic and atraumatic.     Jaw: There is normal jaw occlusion.     Right Ear:  Hearing normal.     Left Ear: Hearing normal.     Nose: Nose normal.     Mouth/Throat:     Lips: Pink.     Mouth: Mucous membranes are moist.     Pharynx: Oropharynx is clear. Uvula midline.  Eyes:     General: Lids are normal.     Extraocular Movements: Extraocular movements intact.     Conjunctiva/sclera: Conjunctivae normal.     Pupils: Pupils are equal, round, and reactive to light.  Neck:     Musculoskeletal: Normal range of motion and neck supple.     Thyroid: No thyroid mass, thyromegaly or thyroid tenderness.     Vascular: No carotid bruit or JVD.     Trachea: Trachea and phonation normal.  Cardiovascular:     Rate and Rhythm: Normal rate and regular rhythm.     Chest Wall: PMI is not displaced.     Pulses: Normal pulses.     Heart sounds: Normal heart sounds. No murmur. No friction rub. No gallop.   Pulmonary:     Effort: Pulmonary effort is normal. No respiratory distress.     Breath sounds: Normal breath sounds. No wheezing.  Abdominal:     General: Bowel sounds are normal. There is no distension or abdominal bruit.     Palpations: Abdomen is soft. There is no hepatomegaly or splenomegaly.     Tenderness: There is no abdominal tenderness. There is no right CVA tenderness or left CVA tenderness.     Hernia: No hernia is present.  Musculoskeletal: Normal range of motion.     Right lower leg: No edema.     Left lower leg: No edema.  Lymphadenopathy:     Cervical: No cervical adenopathy.  Skin:    General: Skin is warm and dry.     Capillary Refill: Capillary refill takes less than 2 seconds.     Coloration: Skin is not cyanotic, jaundiced or pale.     Findings: No rash.  Neurological:     General: No focal deficit present.     Mental Status: She is alert and oriented to person, place, and time.     Cranial Nerves: Cranial nerves are intact.     Sensory: Sensation is intact.     Motor: Motor function is intact.  Coordination: Coordination is intact.     Gait:  Gait is intact.     Deep Tendon Reflexes: Reflexes are normal and symmetric.  Psychiatric:        Attention and Perception: Attention and perception normal.        Mood and Affect: Mood and affect normal.        Speech: Speech normal.        Behavior: Behavior normal. Behavior is cooperative.        Thought Content: Thought content normal.        Cognition and Memory: Cognition and memory normal.        Judgment: Judgment normal.     Results for orders placed or performed in visit on 06/20/18  D-dimer, quantitative (not at [facility])  Result Value Ref Range   D-DIMER 0.64 (H) 0.00 - 0.49 mg/L FEU  Basic Metabolic Panel  Result Value Ref Range   Glucose 85 65 - 99 mg/dL   BUN 28 (H) 8 - 27 mg/dL   Creatinine, Ser 1.39 (H) 0.57 - 1.00 mg/dL   GFR calc non Af Amer 37 (L) >59 mL/min/1.73   GFR calc Af Amer 43 (L) >59 mL/min/1.73   BUN/Creatinine Ratio 20 12 - 28   Sodium 144 134 - 144 mmol/L   Potassium 4.1 3.5 - 5.2 mmol/L   Chloride 106 96 - 106 mmol/L   CO2 25 20 - 29 mmol/L   Calcium 9.2 8.7 - 10.3 mg/dL       Pertinent labs & imaging results that were available during my care of the patient were reviewed by me and considered in my medical decision making.  Assessment & Plan:  Stacy Henry was seen today for medical management of chronic issues, hyperlipidemia and hypertension.  Diagnoses and all orders for this visit:  Essential hypertension DASH diet and exercise encouraged. Labs pending. Continue medications as prescribed. Report any persistent high or low readings.  -     CBC with Differential/Platelet -     CMP14+EGFR -     Lipid panel -     Thyroid Panel With TSH -     chlorthalidone (HYGROTON) 25 MG tablet; Take 1 tablet (25 mg total) by mouth daily. -     losartan (COZAAR) 100 MG tablet; Take 1 tablet (100 mg total) by mouth daily. (Needs to be seen before next refill)  Mixed hyperlipidemia Diet and exercise encouraged. Continue below. Labs pending.  -     Lipid panel  -     pravastatin (PRAVACHOL) 40 MG tablet; Take 1 tablet (40 mg total) by mouth at bedtime.  Chronic kidney disease (CKD) stage G3b/A1, moderately decreased glomerular filtration rate (GFR) between 30-44 mL/min/1.73 square meter and albuminuria creatinine ratio less than 30 mg/g (HCC) No changes in urinary output. Labs pending.  -     CBC with Differential/Platelet -     CMP14+EGFR  Gastroesophageal reflux disease without esophagitis Stable, continue below.  -     CBC with Differential/Platelet -     pantoprazole (PROTONIX) 40 MG tablet; Take 1 tablet (40 mg total) by mouth daily. (Needs to be seen before next refill)  Insomnia due to stress Melatonin no longer working. Sleep hygiene discussed. Will trial low dose trazodone. Report any new or worsening symptoms. Follow up in 3 months or sooner if needed.  -     traZODone (DESYREL) 50 MG tablet; Take 0.5 tablets (25 mg total) by mouth at bedtime.     Continue all  other maintenance medications.  Follow up plan: Return in about 3 months (around 10/26/2018), or if symptoms worsen or fail to improve, for insomnia.  Educational handout given for insomnia  The above assessment and management plan was discussed with the patient. The patient verbalized understanding of and has agreed to the management plan. Patient is aware to call the clinic if symptoms persist or worsen. Patient is aware when to return to the clinic for a follow-up visit. Patient educated on when it is appropriate to go to the emergency department.   Monia Pouch, FNP-C [facility] [phone] 07/26/18

## 2018-07-27 LAB — CMP14+EGFR
ALT: 18 IU/L (ref 0–32)
AST: 22 IU/L (ref 0–40)
Albumin/Globulin Ratio: 1.5 (ref 1.2–2.2)
Albumin: 4.5 g/dL (ref 3.7–4.7)
Alkaline Phosphatase: 72 IU/L (ref 39–117)
BUN/Creatinine Ratio: 18 (ref 12–28)
BUN: 22 mg/dL (ref 8–27)
Bilirubin Total: 0.5 mg/dL (ref 0.0–1.2)
CO2: 24 mmol/L (ref 20–29)
Calcium: 9.8 mg/dL (ref 8.7–10.3)
Chloride: 101 mmol/L (ref 96–106)
Creatinine, Ser: 1.23 mg/dL — ABNORMAL HIGH (ref 0.57–1.00)
GFR calc Af Amer: 50 mL/min/{1.73_m2} — ABNORMAL LOW (ref >59)
GFR calc non Af Amer: 43 mL/min/{1.73_m2} — ABNORMAL LOW (ref >59)
Globulin, Total: 3.1 g/dL (ref 1.5–4.5)
Glucose: 115 mg/dL — ABNORMAL HIGH (ref 65–99)
Potassium: 4.1 mmol/L (ref 3.5–5.2)
Sodium: 141 mmol/L (ref 134–144)
Total Protein: 7.6 g/dL (ref 6.0–8.5)

## 2018-07-27 LAB — LIPID PANEL
Chol/HDL Ratio: 3.3 ratio (ref 0.0–4.4)
Cholesterol, Total: 188 mg/dL (ref 100–199)
HDL: 57 mg/dL (ref >39)
LDL Calculated: 94 mg/dL (ref 0–99)
Triglycerides: 184 mg/dL — ABNORMAL HIGH (ref 0–149)
VLDL Cholesterol Cal: 37 mg/dL (ref 5–40)

## 2018-07-27 LAB — CBC WITH DIFFERENTIAL/PLATELET
Basophils Absolute: 0 10*3/uL (ref 0.0–0.2)
Basos: 0 %
EOS (ABSOLUTE): 0.3 10*3/uL (ref 0.0–0.4)
Eos: 3 %
Hematocrit: 41.8 % (ref 34.0–46.6)
Hemoglobin: 14.1 g/dL (ref 11.1–15.9)
Immature Grans (Abs): 0 10*3/uL (ref 0.0–0.1)
Immature Granulocytes: 0 %
Lymphocytes Absolute: 3.8 10*3/uL — ABNORMAL HIGH (ref 0.7–3.1)
Lymphs: 39 %
MCH: 28.9 pg (ref 26.6–33.0)
MCHC: 33.7 g/dL (ref 31.5–35.7)
MCV: 86 fL (ref 79–97)
Monocytes Absolute: 1 10*3/uL — ABNORMAL HIGH (ref 0.1–0.9)
Monocytes: 10 %
Neutrophils Absolute: 4.8 10*3/uL (ref 1.4–7.0)
Neutrophils: 48 %
Platelets: 282 10*3/uL (ref 150–450)
RBC: 4.88 x10E6/uL (ref 3.77–5.28)
RDW: 12.6 % (ref 11.7–15.4)
WBC: 9.9 10*3/uL (ref 3.4–10.8)

## 2018-07-27 LAB — THYROID PANEL WITH TSH
Free Thyroxine Index: 2.3 (ref 1.2–4.9)
T3 Uptake Ratio: 24 % (ref 24–39)
T4, Total: 9.6 ug/dL (ref 4.5–12.0)
TSH: 0.93 u[IU]/mL (ref 0.450–4.500)

## 2018-09-06 ENCOUNTER — Encounter: Payer: Self-pay | Admitting: *Deleted

## 2018-09-13 ENCOUNTER — Other Ambulatory Visit: Payer: Self-pay | Admitting: *Deleted

## 2018-09-13 DIAGNOSIS — Z20822 Contact with and (suspected) exposure to covid-19: Secondary | ICD-10-CM

## 2018-09-14 LAB — NOVEL CORONAVIRUS, NAA: SARS-CoV-2, NAA: NOT DETECTED

## 2018-09-21 ENCOUNTER — Ambulatory Visit (INDEPENDENT_AMBULATORY_CARE_PROVIDER_SITE_OTHER): Payer: Medicare Other | Admitting: Family Medicine

## 2018-09-21 ENCOUNTER — Encounter: Payer: Self-pay | Admitting: Family Medicine

## 2018-09-21 DIAGNOSIS — R350 Frequency of micturition: Secondary | ICD-10-CM | POA: Diagnosis not present

## 2018-09-21 DIAGNOSIS — R300 Dysuria: Secondary | ICD-10-CM | POA: Diagnosis not present

## 2018-09-21 DIAGNOSIS — N3090 Cystitis, unspecified without hematuria: Secondary | ICD-10-CM

## 2018-09-21 MED ORDER — CEPHALEXIN 500 MG PO CAPS: 500.0000 mg | ORAL_CAPSULE | Freq: Two times a day (BID) | ORAL | 0 refills | Status: AC

## 2018-09-21 NOTE — Progress Notes (Signed)
Virtual Visit via telephone Note Due to COVID-19 pandemic this visit was conducted virtually. This visit type was conducted due to national recommendations for restrictions regarding the COVID-19 Pandemic (e.g. social distancing, sheltering in place) in an effort to limit this patient's exposure and mitigate transmission in our community. All issues noted in this document were discussed and addressed.  A physical exam was not performed with this format.   I connected with Stacy Henry on 09/21/18 at 1320 by telephone and verified that I am speaking with the correct person using two identifiers. Stacy Henry is currently located at home and family is currently with them during visit. The provider, Monia Pouch, FNP is located in their office at time of visit.  I discussed the limitations, risks, security and privacy concerns of performing an evaluation and management service by telephone and the availability of in person appointments. I also discussed with the patient that there may be a patient responsible charge related to this service. The patient expressed understanding and agreed to proceed.  Subjective:  Patient ID: Stacy Henry, female    DOB: January 08, 1945, 73 y.o.   MRN: [id number]  Chief Complaint:  Dysuria   HPI: Stacy Henry is a 73 y.o. female presenting on 09/21/2018 for Dysuria   Pt reports dysuria, urgency, and frequency. States her symptoms started early this morning. No abdominal pain or flank pain. No fever, chills, weakness, confusion, or hematuria. She has increased her water intake today without resolution of symptoms. States she has burning, 5/10, with every void.   Dysuria  This is a new problem. The current episode started today. The problem has been gradually worsening. The quality of the pain is described as burning. The pain is at a severity of 5/10. The pain is mild. There has been no fever. She is not sexually active. There is no history of  pyelonephritis. Associated symptoms include frequency and urgency. Pertinent negatives include no chills, discharge, flank pain, hematuria, hesitancy, nausea, possible pregnancy, sweats or vomiting. She has tried increased fluids for the symptoms. The treatment provided no relief.     Relevant past medical, surgical, family, and social history reviewed and updated as indicated.  Allergies and medications reviewed and updated.   Past Medical History:  Diagnosis Date  . Allergy   . GERD (gastroesophageal reflux disease)   . HTN (hypertension)   . Hyperlipidemia   . Insomnia   . Ischemic colitis ([place]) 2011  . Osteopenia     Past Surgical History:  Procedure Laterality Date  . APPENDECTOMY  as teenager  . BREAST SURGERY Bilateral    "lump"removal  . COLONOSCOPY  2006   RMR: normal.  . COLONOSCOPY  2012   [facility]: most c/w ischemic colitis, no path  . COLONOSCOPY N/A 12/03/2014   Procedure: COLONOSCOPY;  Surgeon: Daneil Dolin, MD;  Location[facility];  Service: Endoscopy;  Laterality: N/A;  0900  . EYE SURGERY Bilateral   . FINGER SURGERY      Social History   Socioeconomic History  . Marital status: Married    Spouse name: Not on file  . Number of children: 2  . Years of education: Not on file  . Highest education level: Not on file  Occupational History  . Occupation: Electronics engineer  Social Needs  . Financial resource strain: Not on file  . Food insecurity    Worry: Not on file    Inability: Not on file  . Transportation  needs    Medical: Not on file    Non-medical: Not on file  Tobacco Use  . Smoking status: Never Smoker  . Smokeless tobacco: Never Used  Substance and Sexual Activity  . Alcohol use: Yes    Comment: ocassional wine - less than weekly  . Drug use: No  . Sexual activity: Yes    Partners: Male  Lifestyle  . Physical activity    Days per week: Not on file    Minutes per session: Not on file  . Stress: Not on  file  Relationships  . Social Herbalist on phone: Not on file    Gets together: Not on file    Attends religious service: Not on file    Active member of club or organization: Not on file    Attends meetings of clubs or organizations: Not on file    Relationship status: Not on file  . Intimate partner violence    Fear of current or ex partner: Not on file    Emotionally abused: Not on file    Physically abused: Not on file    Forced sexual activity: Not on file  Other Topics Concern  . Not on file  Social History Narrative  . Not on file    Outpatient Encounter Medications as of 09/21/2018  Medication Sig  . b complex vitamins tablet Take 1 tablet by mouth daily.  . Biotin w/ Vitamins C & E (HAIR/SKIN/NAILS PO) Take 1 tablet by mouth daily.  . cephALEXin (KEFLEX) 500 MG capsule Take 1 capsule (500 mg total) by mouth 2 (two) times daily for 7 days.  . chlorthalidone (HYGROTON) 25 MG tablet Take 1 tablet (25 mg total) by mouth daily.  . fexofenadine-pseudoephedrine (ALLEGRA-D) 60-120 MG 12 hr tablet Take 1 tablet by mouth daily. (Patient not taking: Reported on 07/26/2018)  . losartan (COZAAR) 100 MG tablet Take 1 tablet (100 mg total) by mouth daily. (Needs to be seen before next refill)  . Melatonin 10 MG SUBL Place 10 mg under the tongue.  . pantoprazole (PROTONIX) 40 MG tablet Take 1 tablet (40 mg total) by mouth daily. (Needs to be seen before next refill)  . pravastatin (PRAVACHOL) 40 MG tablet Take 1 tablet (40 mg total) by mouth at bedtime.  . traZODone (DESYREL) 50 MG tablet Take 0.5 tablets (25 mg total) by mouth at bedtime.  . valACYclovir (VALTREX) 1000 MG tablet 2gm twice a day for 24h for symptoms (Patient not taking: Reported on 07/26/2018)   No facility-administered encounter medications on file as of 09/21/2018.     Allergies  Allergen Reactions  . Sulfa Antibiotics     Review of Systems  Constitutional: Negative for activity change, appetite change,  chills, fatigue and fever.  HENT: Negative.   Eyes: Negative.   Respiratory: Negative for cough, chest tightness and shortness of breath.   Cardiovascular: Negative for chest pain, palpitations and leg swelling.  Gastrointestinal: Negative for abdominal pain, blood in stool, constipation, diarrhea, nausea and vomiting.  Endocrine: Negative.   Genitourinary: Positive for dysuria, frequency and urgency. Negative for decreased urine volume, difficulty urinating, enuresis, flank pain, genital sores, hematuria, hesitancy, menstrual problem, pelvic pain, vaginal bleeding, vaginal discharge and vaginal pain.  Musculoskeletal: Negative for arthralgias, back pain and myalgias.  Skin: Negative.   Allergic/Immunologic: Negative.   Neurological: Negative for dizziness, weakness and headaches.  Hematological: Negative.   Psychiatric/Behavioral: Negative for confusion, hallucinations, sleep disturbance and suicidal ideas.  All other systems  reviewed and are negative.        Observations/Objective: No vital signs or physical exam, this was a telephone or virtual health encounter.  Pt alert and oriented, answers all questions appropriately, and able to speak in full sentences.    Assessment and Plan: Anahy was seen today for dysuria.  Diagnoses and all orders for this visit:  Dysuria Frequency of urination Cystitis Reported symptoms consistent with acute cystitis. No red flags concerning for pyelonephritis or urosepsis. Previous cultures revealed and antibiotic choice based on results. Symptomatic care discussed. Medications as prescribed. Report any new, worsening, or persistent symptoms.  -     cephALEXin (KEFLEX) 500 MG capsule; Take 1 capsule (500 mg total) by mouth 2 (two) times daily for 7 days.     Follow Up Instructions: Return if symptoms worsen or fail to improve.    I discussed the assessment and treatment plan with the patient. The patient was provided an opportunity to ask  questions and all were answered. The patient agreed with the plan and demonstrated an understanding of the instructions.   The patient was advised to call back or seek an in-person evaluation if the symptoms worsen or if the condition fails to improve as anticipated.  The above assessment and management plan was discussed with the patient. The patient verbalized understanding of and has agreed to the management plan. Patient is aware to call the clinic if they develop any new symptoms or if symptoms persist or worsen. Patient is aware when to return to the clinic for a follow-up visit. Patient educated on when it is appropriate to go to the emergency department.    I provided 15 minutes of non-face-to-face time during this encounter. The call started at 1320. The call ended at 1335. The other time was used for coordination of care.    Monia Pouch, FNP-C [facility] [address] [place], [place] [address] [phone] 09/21/18

## 2018-10-08 ENCOUNTER — Other Ambulatory Visit: Payer: Self-pay | Admitting: Family Medicine

## 2018-10-08 ENCOUNTER — Telehealth: Payer: Self-pay | Admitting: Family Medicine

## 2018-10-08 DIAGNOSIS — N3090 Cystitis, unspecified without hematuria: Secondary | ICD-10-CM

## 2018-10-08 MED ORDER — CIPROFLOXACIN HCL 500 MG PO TABS: 500.0000 mg | ORAL_TABLET | Freq: Two times a day (BID) | ORAL | 0 refills | Status: AC

## 2018-10-08 NOTE — Telephone Encounter (Signed)
Left message, another antibiotic sent in , must be seen if problem does not resolve.  Please call our office for questions.

## 2018-10-08 NOTE — Telephone Encounter (Signed)
Will send in Cipro, if symptoms persist, she needs to be seen

## 2018-10-24 ENCOUNTER — Ambulatory Visit: Payer: Medicare Other | Admitting: Family Medicine

## 2018-10-25 ENCOUNTER — Ambulatory Visit: Payer: Medicare Other | Admitting: Family Medicine

## 2018-10-30 ENCOUNTER — Other Ambulatory Visit: Payer: Self-pay

## 2018-10-30 ENCOUNTER — Ambulatory Visit (INDEPENDENT_AMBULATORY_CARE_PROVIDER_SITE_OTHER): Payer: Medicare Other | Admitting: *Deleted

## 2018-10-30 DIAGNOSIS — Z0000 Encounter for general adult medical examination without abnormal findings: Secondary | ICD-10-CM

## 2018-10-30 NOTE — Progress Notes (Addendum)
MEDICARE ANNUAL WELLNESS VISIT  10/30/2018  Telephone Visit Disclaimer This Medicare AWV was conducted by telephone due to national recommendations for restrictions regarding the COVID-19 Pandemic (e.g. social distancing).  I verified, using two identifiers, that I am speaking with Stacy Henry or their authorized healthcare agent. I discussed the limitations, risks, security, and privacy concerns of performing an evaluation and management service by telephone and the potential availability of an in-person appointment in the future. The patient expressed understanding and agreed to proceed.   Subjective:  Stacy Henry is a 73 y.o. female patient of Stacy Henry, Stacy Burkitt, FNP who had a Medicare Annual Wellness Visit today via telephone. [name] is Retired and lives with their spouse. she has 1 child. she reports that she is socially active and does interact with friends/family regularly. she is minimally physically active and enjoys crafting, quilting, sewing and reading.  Patient Care Team: Stacy Gouty, FNP as PCP - General (Family Medicine) Stacy Blanks, MD as PCP - Cardiology (Cardiology) Stacy Brownie, MD as Consulting Physician (Dermatology) Stacy Dolin, MD as Consulting Physician (Gastroenterology)  Advanced Directives 10/30/2018 10/24/2017 11/01/2016 10/20/2015 12/10/2014 12/03/2014  Does Patient Have a Medical Advance Directive? No No No No No No  Would patient like information on creating a medical advance directive? No - Patient declined Yes (MAU/Ambulatory/Procedural Areas - Information given) - Yes - Educational materials given Yes - Scientist, clinical (histocompatibility and immunogenetics) given No - patient declined information    Hospital Utilization Over the Past 12 Months: # of hospitalizations or ER visits: 0 # of surgeries: 0  Review of Systems    Patient reports that her overall health is unchanged compared to last year.  History obtained from chart review  Patient  Reported Readings (BP, Pulse, CBG, Weight, etc) none  Pain Assessment Pain : No/denies pain     Current Medications & Allergies (verified) Allergies as of 10/30/2018      Reactions   Sulfa Antibiotics       Medication List       Accurate as of October 30, 2018 10:10 AM. If you have any questions, ask your nurse or doctor.        STOP taking these medications   traZODone 50 MG tablet Commonly known as: DESYREL     TAKE these medications   b complex vitamins tablet Take 1 tablet by mouth daily.   chlorthalidone 25 MG tablet Commonly known as: HYGROTON Take 1 tablet (25 mg total) by mouth daily.   fexofenadine-pseudoephedrine 60-120 MG 12 hr tablet Commonly known as: ALLEGRA-D Take 1 tablet by mouth daily.   HAIR/SKIN/NAILS PO Take 1 tablet by mouth daily.   losartan 100 MG tablet Commonly known as: COZAAR Take 1 tablet (100 mg total) by mouth daily. (Needs to be seen before next refill)   Melatonin 10 MG Subl Place 10 mg under the tongue.   pantoprazole 40 MG tablet Commonly known as: PROTONIX Take 1 tablet (40 mg total) by mouth daily. (Needs to be seen before next refill)   pravastatin 40 MG tablet Commonly known as: PRAVACHOL Take 1 tablet (40 mg total) by mouth at bedtime.   valACYclovir 1000 MG tablet Commonly known as: VALTREX 2gm twice a day for 24h for symptoms       History (reviewed): Past Medical History:  Diagnosis Date  . Allergy   . GERD (gastroesophageal reflux disease)   . HTN (hypertension)   . Hyperlipidemia   . Insomnia   .  Ischemic colitis ([place]) 2011  . Osteopenia    Past Surgical History:  Procedure Laterality Date  . APPENDECTOMY  as teenager  . BREAST SURGERY Bilateral    "lump"removal  . COLONOSCOPY  2006   RMR: normal.  . COLONOSCOPY  2012   [facility]: most c/w ischemic colitis, no path  . COLONOSCOPY N/A 12/03/2014   Procedure: COLONOSCOPY;  Surgeon: Stacy Dolin, MD;  Location[facility];  Service: Endoscopy;  Laterality: N/A;  0900  . EYE SURGERY Bilateral   . FINGER SURGERY     Family History  Problem Relation Age of Onset  . Alcohol abuse Sister   . Alcoholism Sister   . Diabetes Brother   . Heart attack Brother 68  . Aneurysm Brother   . Alcoholism Brother   . Diabetes Brother   . Cirrhosis Brother   . Alcohol abuse Brother   . Alcoholism Brother   . Hypertension Mother   . Atrial fibrillation Mother   . Macular degeneration Mother   . Heart attack Father 15  . Kidney disease Sister   . Atrial fibrillation Sister   . Colon cancer Neg Hx    Social History   Socioeconomic History  . Marital status: Married    Spouse name: Stacy Henry  . Number of children: 1  . Years of education: 109  . Highest education level: Some college, no degree  Occupational History  . Occupation: Electronics engineer  Social Needs  . Financial resource strain: Not hard at all  . Food insecurity    Worry: Never true    Inability: Never true  . Transportation needs    Medical: No    Non-medical: No  Tobacco Use  . Smoking status: Never Smoker  . Smokeless tobacco: Never Used  Substance and Sexual Activity  . Alcohol use: Yes    Comment: ocassional wine - less than weekly  . Drug use: No  . Sexual activity: Yes    Partners: Male    Birth control/protection: Post-menopausal  Lifestyle  . Physical activity    Days per week: 0 days    Minutes per session: 0 min  . Stress: Not at all  Relationships  . Social connections    Talks on phone: More than three times a week    Gets together: More than three times a week    Attends religious service: More than 4 times per year    Active member of club or organization: Yes    Attends meetings of clubs or organizations: More than 4 times per year    Relationship status: Married  Other Topics Concern  . Not on file  Social History Narrative  . Not on file    Activities of Daily Living In your present state of health,  do you have any difficulty performing the following activities: 10/30/2018  Hearing? N  Vision? N  Comment wears glasses-gets eye exam every 2 years usually  Difficulty concentrating or making decisions? N  Walking or climbing stairs? N  Dressing or bathing? N  Doing errands, shopping? N  Preparing Food and eating ? N  Using the Toilet? N  In the past six months, have you accidently leaked urine? N  Do you have problems with loss of bowel control? N  Managing your Medications? N  Managing your Finances? N  Housekeeping or managing your Housekeeping? N  Some recent data might be hidden    Patient Education/ Literacy How often do you need  to have someone help you when you read instructions, pamphlets, or other written materials from your doctor or pharmacy?: 1 - Never What is the last grade level you completed in school?: one year of college-no degree  Exercise Current Exercise Habits: The patient does not participate in regular exercise at present, Exercise limited by: None identified  Diet Patient reports consuming 2 meals a day and 2 snack(s) a day Patient reports that her primary diet is: Regular Patient reports that she does have regular access to food.   Depression Screen PHQ 2/9 Scores 10/30/2018 07/26/2018 06/20/2018 01/24/2018 10/24/2017 10/04/2017 04/24/2017  PHQ - 2 Score 0 0 0 0 1 0 0  PHQ- 9 Score - - 0 - - - -     Fall Risk Fall Risk  10/30/2018 07/26/2018 06/20/2018 01/24/2018 10/24/2017  Falls in the past year? 0 0 0 0 No  Number falls in past yr: 0 - - - -  Injury with Fall? 0 - - - -  Follow up Falls prevention discussed - - - -  Comment Get rid of all throw rugs in the house, adequate lighting in the walkways and grab bars in the  bathroom - - - -     Objective:  Stacy Henry seemed alert and oriented and she participated appropriately during our telephone visit.  Blood Pressure Weight BMI  BP Readings from Last 3 Encounters:  07/26/18 117/78   06/20/18 138/85  01/24/18 119/65   Wt Readings from Last 3 Encounters:  07/26/18 162 lb (73.5 kg)  06/20/18 164 lb (74.4 kg)  01/24/18 166 lb (75.3 kg)   BMI Readings from Last 1 Encounters:  07/26/18 26.96 kg/m    *Unable to obtain current vital signs, weight, and BMI due to telephone visit type  Hearing/Vision  . Keylin did not seem to have difficulty with hearing/understanding during the telephone conversation . Reports that she has had a formal eye exam by an eye care professional within the past year . Reports that she has not had a formal hearing evaluation within the past year *Unable to fully assess hearing and vision during telephone visit type  Cognitive Function: 6CIT Screen 10/30/2018  What Year? 0 points  What month? 0 points  What time? 0 points  Count back from 20 0 points  Months in reverse 0 points  Repeat phrase 0 points  Total Score 0   (Normal:0-7, Significant for Dysfunction: >8)  Normal Cognitive Function Screening: Yes   Immunization & Health Maintenance Record Immunization History  Administered Date(s) Administered  . Influenza, High Dose Seasonal PF 10/19/2016, 10/04/2017  . Influenza,inj,Quad PF,6+ Mos 10/25/2012, 12/10/2014, 10/20/2015  . Pneumococcal Conjugate-13 12/10/2014  . Pneumococcal Polysaccharide-23 04/21/2009  . Td 01/03/2009  . Zoster 04/21/2009    Health Maintenance  Topic Date Due  . INFLUENZA VACCINE  08/04/2018  . TETANUS/TDAP  01/04/2019  . DEXA SCAN  01/10/2019  . MAMMOGRAM  11/29/2019  . COLONOSCOPY  12/02/2024  . Hepatitis C Screening  Completed  . PNA vac Low Risk Adult  Completed       Assessment  This is a routine wellness examination for Pennylane Mccory.  Health Maintenance: Due or Overdue Health Maintenance Due  Topic Date Due  . INFLUENZA VACCINE  08/04/2018    Stacy Henry does not need a referral for Community Assistance: Care Management:   no Social Work:    no Prescription  Assistance:  no Nutrition/Diabetes Education:  no   Plan:  Personalized  Goals Goals Addressed            This Visit's Progress   . DIET - INCREASE WATER INTAKE       Try to drink 6-8 glasses of water daily      Personalized Health Maintenance & Screening Recommendations  Influenza vaccine  Lung Cancer Screening Recommended: no (Low Dose CT Chest recommended if Age 30-80 years, 30 pack-year currently smoking OR have quit w/in past 15 years) Hepatitis C Screening recommended: no HIV Screening recommended: no  Advanced Directives: Written information was not prepared per patient's request.  Referrals & Orders No orders of the defined types were placed in this encounter.   Follow-up Plan . Follow-up with Stacy Gouty, FNP as planned . Get your Flu vaccine at your next visit with your PCP   I have personally reviewed and noted the following in the patient's chart:   . Medical and social history . Use of alcohol, tobacco or illicit drugs  . Current medications and supplements . Functional ability and status . Nutritional status . Physical activity . Advanced directives . List of other physicians . Hospitalizations, surgeries, and ER visits in previous 12 months . Vitals . Screenings to include cognitive, depression, and falls . Referrals and appointments  In addition, I have reviewed and discussed with Stacy Henry certain preventive protocols, quality metrics, and best practice recommendations. A written personalized care plan for preventive services as well as general preventive health recommendations is available and can be mailed to the patient at her request.      Javonta Gronau, Donny Pique, LPN  [id number]   I have reviewed and agree with the above AWV documentation.   Mary-Margaret Hassell Done, FNP

## 2018-10-30 NOTE — Patient Instructions (Signed)
Preventive Care 74 Years and Older, Female Preventive care refers to lifestyle choices and visits with your health care provider that can promote health and wellness. This includes:  A yearly physical exam. This is also called an annual well check.  Regular dental and eye exams.  Immunizations.  Screening for certain conditions.  Healthy lifestyle choices, such as diet and exercise. What can I expect for my preventive care visit? Physical exam Your health care provider will check:  Height and weight. These may be used to calculate body mass index (BMI), which is a measurement that tells if you are at a healthy weight.  Heart rate and blood pressure.  Your skin for abnormal spots. Counseling Your health care provider may ask you questions about:  Alcohol, tobacco, and drug use.  Emotional well-being.  Home and relationship well-being.  Sexual activity.  Eating habits.  History of falls.  Memory and ability to understand (cognition).  Work and work Statistician.  Pregnancy and menstrual history. What immunizations do I need?  Influenza (flu) vaccine  This is recommended every year. Tetanus, diphtheria, and pertussis (Tdap) vaccine  You may need a Td booster every 10 years. Varicella (chickenpox) vaccine  You may need this vaccine if you have not already been vaccinated. Zoster (shingles) vaccine  You may need this after age 33. Pneumococcal conjugate (PCV13) vaccine  One dose is recommended after age 33. Pneumococcal polysaccharide (PPSV23) vaccine  One dose is recommended after age 72. Measles, mumps, and rubella (MMR) vaccine  You may need at least one dose of MMR if you were born in 1957 or later. You may also need a second dose. Meningococcal conjugate (MenACWY) vaccine  You may need this if you have certain conditions. Hepatitis A vaccine  You may need this if you have certain conditions or if you travel or work in places where you may be exposed  to hepatitis A. Hepatitis B vaccine  You may need this if you have certain conditions or if you travel or work in places where you may be exposed to hepatitis B. Haemophilus influenzae type b (Hib) vaccine  You may need this if you have certain conditions. You may receive vaccines as individual doses or as more than one vaccine together in one shot (combination vaccines). Talk with your health care provider about the risks and benefits of combination vaccines. What tests do I need? Blood tests  Lipid and cholesterol levels. These may be checked every 5 years, or more frequently depending on your overall health.  Hepatitis C test.  Hepatitis B test. Screening  Lung cancer screening. You may have this screening every year starting at age 39 if you have a 30-pack-year history of smoking and currently smoke or have quit within the past 15 years.  Colorectal cancer screening. All adults should have this screening starting at age 36 and continuing until age 15. Your health care provider may recommend screening at age 23 if you are at increased risk. You will have tests every 1-10 years, depending on your results and the type of screening test.  Diabetes screening. This is done by checking your blood sugar (glucose) after you have not eaten for a while (fasting). You may have this done every 1-3 years.  Mammogram. This may be done every 1-2 years. Talk with your health care provider about how often you should have regular mammograms.  BRCA-related cancer screening. This may be done if you have a family history of breast, ovarian, tubal, or peritoneal cancers.  Other tests  Sexually transmitted disease (STD) testing.  Bone density scan. This is done to screen for osteoporosis. You may have this done starting at age 76. Follow these instructions at home: Eating and drinking  Eat a diet that includes fresh fruits and vegetables, whole grains, lean protein, and low-fat dairy products. Limit  your intake of foods with high amounts of sugar, saturated fats, and salt.  Take vitamin and mineral supplements as recommended by your health care provider.  Do not drink alcohol if your health care provider tells you not to drink.  If you drink alcohol: ? Limit how much you have to 0-1 drink a day. ? Be aware of how much alcohol is in your drink. In the [place] one drink equals one 12 oz bottle of beer (355 mL), one 5 oz glass of wine (148 mL), or one 1 oz glass of hard liquor (44 mL). Lifestyle  Take daily care of your teeth and gums.  Stay active. Exercise for at least 30 minutes on 5 or more days each week.  Do not use any products that contain nicotine or tobacco, such as cigarettes, e-cigarettes, and chewing tobacco. If you need help quitting, ask your health care provider.  If you are sexually active, practice safe sex. Use a condom or other form of protection in order to prevent STIs (sexually transmitted infections).  Talk with your health care provider about taking a low-dose aspirin or statin. What's next?  Go to your health care provider once a year for a well check visit.  Ask your health care provider how often you should have your eyes and teeth checked.  Stay up to date on all vaccines. This information is not intended to replace advice given to you by your health care provider. Make sure you discuss any questions you have with your health care provider. Document Released: 01/16/2015 Document Revised: 12/14/2017 Document Reviewed: 12/14/2017 Elsevier Patient Education  2020 [employer].

## 2018-10-31 ENCOUNTER — Encounter: Payer: Self-pay | Admitting: Family Medicine

## 2018-10-31 ENCOUNTER — Ambulatory Visit (INDEPENDENT_AMBULATORY_CARE_PROVIDER_SITE_OTHER): Payer: Medicare Other | Admitting: Family Medicine

## 2018-10-31 VITALS — BP 159/85 | HR 76 | Temp 96.8°F | Resp 20 | Ht 65.0 in | Wt 164.0 lb

## 2018-10-31 DIAGNOSIS — H6593 Unspecified nonsuppurative otitis media, bilateral: Secondary | ICD-10-CM | POA: Diagnosis not present

## 2018-10-31 DIAGNOSIS — Z23 Encounter for immunization: Secondary | ICD-10-CM

## 2018-10-31 DIAGNOSIS — N1832 Chronic kidney disease, stage 3b: Secondary | ICD-10-CM | POA: Diagnosis not present

## 2018-10-31 DIAGNOSIS — E785 Hyperlipidemia, unspecified: Secondary | ICD-10-CM

## 2018-10-31 DIAGNOSIS — I10 Essential (primary) hypertension: Secondary | ICD-10-CM | POA: Diagnosis not present

## 2018-10-31 DIAGNOSIS — B351 Tinea unguium: Secondary | ICD-10-CM

## 2018-10-31 MED ORDER — LEVOCETIRIZINE DIHYDROCHLORIDE 5 MG PO TABS: 5.0000 mg | ORAL_TABLET | Freq: Every evening | ORAL | 3 refills | Status: DC

## 2018-10-31 MED ORDER — FLUTICASONE PROPIONATE 50 MCG/ACT NA SUSP: 2.0000 | Freq: Every day | NASAL | 6 refills | Status: DC

## 2018-10-31 MED ORDER — TERBINAFINE HCL 250 MG PO TABS: 250.0000 mg | ORAL_TABLET | Freq: Every day | ORAL | 4 refills | Status: DC

## 2018-10-31 NOTE — Progress Notes (Signed)
Subjective:  Patient ID: Stacy Henry, female    DOB: November 21, 1945, 73 y.o.   MRN: [id number]  Patient Care Team: Baruch Gouty, FNP as PCP - General (Family Medicine) Burnell Blanks, MD as PCP - Cardiology (Cardiology) Druscilla Brownie, MD as Consulting Physician (Dermatology) Gala Romney Cristopher Estimable, MD as Consulting Physician (Gastroenterology)   Chief Complaint:  Medical Management of Chronic Issues (3 mo ) and Hypertension   HPI: Stacy Henry is a 73 y.o. female presenting on 10/31/2018 for Medical Management of Chronic Issues (3 mo ) and Hypertension   Hypertension Pertinent negatives include no chest pain, palpitations or shortness of breath.     1. Hypertension Stacy Henry reports an episode 3 weeks ago where she felt weak and lethargic. She did not have CP, SOB, dizziness, or one sided weakness during this episode. She stopped taking her BP meds following this. She checks her BP at home a few times a week and reports the range to be 130s/80s the last few weeks. She denies chest pain, dyspnea, dizziness, or fatigue. Denies lethargy or weakness since. Denies changes in urine output. No swelling or shortness of breath.    2. Itching of ear Bilateral itching of ears for the last few months. Denies ear pain or drainage. Has allergic rhinitis for which she takes allegra. Was previously on flonase as well.   3. Nail abnormality Reports thickening and discoloration of bilateral great toes for years. Has tried multiple OTC remedies with no improvement.    Relevant past medical, surgical, family, and social history reviewed and updated as indicated.  Allergies and medications reviewed and updated. Date reviewed: Chart in Epic.   Past Medical History:  Diagnosis Date   Allergy    GERD (gastroesophageal reflux disease)    HTN (hypertension)    Hyperlipidemia    Insomnia    Ischemic colitis ([place]) 2011   Osteopenia     Past Surgical History:  Procedure  Laterality Date   APPENDECTOMY  as teenager   BREAST SURGERY Bilateral    "lump"removal   COLONOSCOPY  2006   RMR: normal.   COLONOSCOPY  2012   [facility]: most c/w ischemic colitis, no path   COLONOSCOPY N/A 12/03/2014   Procedure: COLONOSCOPY;  Surgeon: Daneil Dolin, MD;  Location: [facility];  Service: Endoscopy;  Laterality: N/A;  0900   EYE SURGERY Bilateral    FINGER SURGERY      Social History   Socioeconomic History   Marital status: Married    Spouse name: Kyung Rudd   Number of children: 1   Years of education: 13   Highest education level: Some college, no degree  Occupational History   Occupation: Optician, dispensing strain: Not hard at all   Food insecurity    Worry: Never true    Inability: Never true   Transportation needs    Medical: No    Non-medical: No  Tobacco Use   Smoking status: Never Smoker   Smokeless tobacco: Never Used  Substance and Sexual Activity   Alcohol use: Yes    Comment: ocassional wine - less than weekly   Drug use: No   Sexual activity: Yes    Partners: Male    Birth control/protection: Post-menopausal  Lifestyle   Physical activity    Days per week: 0 days    Minutes per session: 0 min   Stress: Not at all  Relationships   Social  connections    Talks on phone: More than three times a week    Gets together: More than three times a week    Attends religious service: More than 4 times per year    Active member of club or organization: Yes    Attends meetings of clubs or organizations: More than 4 times per year    Relationship status: Married   Intimate partner violence    Fear of current or ex partner: No    Emotionally abused: No    Physically abused: No    Forced sexual activity: No  Other Topics Concern   Not on file  Social History Narrative   Not on file    Outpatient Encounter Medications as of 10/31/2018  Medication Sig   b  complex vitamins tablet Take 1 tablet by mouth daily.   Biotin w/ Vitamins C & E (HAIR/SKIN/NAILS PO) Take 1 tablet by mouth daily.   Calcium Carbonate-Vit D-Min (CALCIUM 1200 PO) Take by mouth. One a day   fexofenadine-pseudoephedrine (ALLEGRA-D) 60-120 MG 12 hr tablet Take 1 tablet by mouth daily.   Melatonin 10 MG SUBL Place 10 mg under the tongue.   pantoprazole (PROTONIX) 40 MG tablet Take 1 tablet (40 mg total) by mouth daily. (Needs to be seen before next refill)   pravastatin (PRAVACHOL) 40 MG tablet Take 1 tablet (40 mg total) by mouth at bedtime.   chlorthalidone (HYGROTON) 25 MG tablet Take 1 tablet (25 mg total) by mouth daily. (Patient not taking: Reported on 10/30/2018)   fluticasone (FLONASE) 50 MCG/ACT nasal spray Place 2 sprays into both nostrils daily.   levocetirizine (XYZAL) 5 MG tablet Take 1 tablet (5 mg total) by mouth every evening.   losartan (COZAAR) 100 MG tablet Take 1 tablet (100 mg total) by mouth daily. (Needs to be seen before next refill) (Patient not taking: Reported on 10/30/2018)   terbinafine (LAMISIL) 250 MG tablet Take 1 tablet (250 mg total) by mouth daily.   valACYclovir (VALTREX) 1000 MG tablet 2gm twice a day for 24h for symptoms (Patient not taking: Reported on 10/31/2018)   No facility-administered encounter medications on file as of 10/31/2018.     Allergies  Allergen Reactions   Sulfa Antibiotics     Review of Systems  Constitutional: Negative for fatigue, fever and unexpected weight change.  HENT: Positive for congestion (baseline for patient). Negative for ear discharge and ear pain.        Itching of ears  Eyes: Negative for visual disturbance.  Respiratory: Negative for shortness of breath.   Cardiovascular: Negative for chest pain, palpitations and leg swelling.  Gastrointestinal: Negative for abdominal pain, nausea and vomiting.  Genitourinary: Negative for decreased urine volume and difficulty urinating.    Musculoskeletal: Negative for gait problem and myalgias.  Skin: Negative for rash and wound.  Neurological: Negative for dizziness, facial asymmetry, weakness, light-headedness and numbness.  Psychiatric/Behavioral: Negative for confusion.        Objective:  BP (!) 159/85 (BP Location: Left Arm, Cuff Size: Normal)    Pulse 76    Temp (!) 96.8 F (36 C)    Resp 20    Ht '5\' 5"'  (1.651 m)    Wt 164 lb (74.4 kg)    SpO2 99%    BMI 27.29 kg/m    Wt Readings from Last 3 Encounters:  10/31/18 164 lb (74.4 kg)  07/26/18 162 lb (73.5 kg)  06/20/18 164 lb (74.4 kg)    Physical Exam Vitals  signs and nursing note reviewed.  Constitutional:      General: She is not in acute distress.    Appearance: Normal appearance. She is normal weight. She is not ill-appearing or diaphoretic.  HENT:     Head: Normocephalic and atraumatic.     Right Ear: Ear canal and external ear normal. No drainage, swelling or tenderness. A middle ear effusion is present. No mastoid tenderness. Tympanic membrane is not erythematous, retracted or bulging.     Left Ear: Ear canal and external ear normal. No drainage, swelling or tenderness. A middle ear effusion is present. No mastoid tenderness. Tympanic membrane is not erythematous, retracted or bulging.  Eyes:     Extraocular Movements: Extraocular movements intact.     Pupils: Pupils are equal, round, and reactive to light.  Cardiovascular:     Rate and Rhythm: Normal rate and regular rhythm.     Heart sounds: Normal heart sounds. No murmur.  Pulmonary:     Effort: Pulmonary effort is normal. No respiratory distress.     Breath sounds: Normal breath sounds.  Abdominal:     General: Bowel sounds are normal. There is no distension.     Palpations: Abdomen is soft.     Tenderness: There is no abdominal tenderness.  Musculoskeletal: Normal range of motion.     Right lower leg: No edema.     Left lower leg: No edema.  Skin:    General: Skin is warm and dry.      Capillary Refill: Capillary refill takes 2 to 3 seconds.     Comments: Bilateral great toes thickened with yellow discoloration.   Neurological:     Mental Status: She is alert and oriented to person, place, and time. Mental status is at baseline.     Motor: No weakness.     Gait: Gait normal.  Psychiatric:        Mood and Affect: Mood normal.        Thought Content: Thought content normal.        Judgment: Judgment normal.     Results for orders placed or performed in visit on 09/13/18  Novel Coronavirus, NAA ([facility])   Specimen: Oropharyngeal(OP) collection in vial transport medium   OROPHARYNGEA  TESTING  Result Value Ref Range   SARS-CoV-2, NAA Not Detected Not Detected       Pertinent labs & imaging results that were available during my care of the patient were reviewed by me and considered in my medical decision making.  Assessment & Plan:  Stacy Henry was seen today for medical management of chronic issues and hypertension.  Diagnoses and all orders for this visit:  Essential hypertension Will restart losartan at 50 mg. Check BP at home. Goal BP <140/90. Report persistent high or low reading. Diet and exercise encouraged.  -     CMP14+EGFR -     CBC with Differential/Platelet  Chronic kidney disease (CKD) stage G3b/A1, moderately decreased glomerular filtration rate (GFR) between 30-44 mL/min/1.73 square meter and albuminuria creatinine ratio less than 30 mg/g -     CMP14+EGFR -     CBC with Differential/Platelet  Hyperlipidemia, unspecified hyperlipidemia type Diet encouraged - increase intake of fresh fruits and vegetables, increase intake of lean proteins. Bake, broil, or grill foods. Avoid fried, greasy, and fatty foods. Avoid fast foods. Increase intake of fiber-rich whole grains. Exercise encouraged - at least 150 minutes per week and advance as tolerated.  Goal BMI < 25. Continue medications as prescribed. Follow up  in 3-6 months as discussed.   Otitis media with  effusion, bilateral Likely due to allergic rhinitis. Start floanse. Discontinue allerga and start xyzal. May take up to 6 weeks for effectiveness. Notify provider of new or worsening symptoms.  -     fluticasone (FLONASE) 50 MCG/ACT nasal spray; Place 2 sprays into both nostrils daily. -     levocetirizine (XYZAL) 5 MG tablet; Take 1 tablet (5 mg total) by mouth every evening.  Onychomycosis Will check CMP with liver enzymes today. Start Lamisil for nail fungus.  -     terbinafine (LAMISIL) 250 MG tablet; Take 1 tablet (250 mg total) by mouth daily.  Need for immunization against influenza -     Flu Vaccine QUAD High Dose(Fluad)   Continue all other maintenance medications.  Follow up plan: Return if symptoms worsen or fail to improve.  Continue healthy lifestyle choices, including diet (rich in fruits, vegetables, and lean proteins, and low in salt and simple carbohydrates) and exercise (at least 30 minutes of moderate physical activity daily).  Educational handout given for nail fungus.   The above assessment and management plan was discussed with the patient. The patient verbalized understanding of and has agreed to the management plan. Patient is aware to call the clinic if they develop any new symptoms or if symptoms persist or worsen. Patient is aware when to return to the clinic for a follow-up visit. Patient educated on when it is appropriate to go to the emergency department.   Marjorie Smolder, RN, FNP student [facility] [phone]  I personally was present during the history, physical exam, and medical decision-making activities of this service and have verified that the service and findings are accurately documented in the nurse practitioner student's note.  Monia Pouch, FNP-C [facility] [address] [place], [place] [address] [phone]

## 2018-10-31 NOTE — Patient Instructions (Signed)
Fungal Nail Infection A fungal nail infection is a common infection of the toenails or fingernails. This condition affects toenails more often than fingernails. It often affects the great, or big, toes. More than one nail may be infected. The condition can be passed from person to person (is contagious). What are the causes? This condition is caused by a fungus. Several types of fungi can cause the infection. These fungi are common in moist and warm areas. If your hands or feet come into contact with the fungus, it may get into a crack in your fingernail or toenail and cause the infection. What increases the risk? The following factors may make you more likely to develop this condition:  Being female.  Being of older age.  Living with someone who has the fungus.  Walking barefoot in areas where the fungus thrives, such as showers or locker rooms.  Wearing shoes and socks that cause your feet to sweat.  Having a nail injury or a recent nail surgery.  Having certain medical conditions, such as: ? Athlete's foot. ? Diabetes. ? Psoriasis. ? Poor circulation. ? A weak body defense system (immune system). What are the signs or symptoms? Symptoms of this condition include:  A pale spot on the nail.  Thickening of the nail.  A nail that becomes yellow or Patel.  A brittle or ragged nail edge.  A crumbling nail.  A nail that has lifted away from the nail bed. How is this diagnosed? This condition is diagnosed with a physical exam. Your health care provider may take a scraping or clipping from your nail to test for the fungus. How is this treated? Treatment is not needed for mild infections. If you have significant nail changes, treatment may include:  Antifungal medicines taken by mouth (orally). You may need to take the medicine for several weeks or several months, and you may not see the results for a long time. These medicines can cause side effects. Ask your health care provider  what problems to watch for.  Antifungal nail polish or nail cream. These may be used along with oral antifungal medicines.  Laser treatment of the nail.  Surgery to remove the nail. This may be needed for the most severe infections. It can take a long time, usually up to a year, for the infection to go away. The infection may also come back. Follow these instructions at home: Medicines  Take or apply over-the-counter and prescription medicines only as told by your health care provider.  Ask your health care provider about using over-the-counter mentholated ointment on your nails. Nail care  Trim your nails often.  Wash and dry your hands and feet every day.  Keep your feet dry: ? Wear absorbent socks, and change your socks frequently. ? Wear shoes that allow air to circulate, such as sandals or canvas tennis shoes. Throw out old shoes.  Do not use artificial nails.  If you go to a nail salon, make sure you choose one that uses clean instruments.  Use antifungal foot powder on your feet and in your shoes. General instructions  Do not share personal items, such as towels or nail clippers.  Do not walk barefoot in shower rooms or locker rooms.  Wear rubber gloves if you are working with your hands in wet areas.  Keep all follow-up visits as told by your health care provider. This is important. Contact a health care provider if: Your infection is not getting better or it is getting worse   after several months. Summary  A fungal nail infection is a common infection of the toenails or fingernails.  Treatment is not needed for mild infections. If you have significant nail changes, treatment may include taking medicine orally and applying medicine to your nails.  It can take a long time, usually up to a year, for the infection to go away. The infection may also come back.  Take or apply over-the-counter and prescription medicines only as told by your health care provider.   Follow instructions for taking care of your nails to help prevent infection from coming back or spreading. This information is not intended to replace advice given to you by your health care provider. Make sure you discuss any questions you have with your health care provider. Document Released: 12/18/1999 Document Revised: 04/12/2018 Document Reviewed: 05/26/2017 Elsevier Patient Education  2020 [employer]  

## 2018-11-01 LAB — CBC WITH DIFFERENTIAL/PLATELET
Basophils Absolute: 0.1 10*3/uL (ref 0.0–0.2)
Basos: 1 %
EOS (ABSOLUTE): 0.3 10*3/uL (ref 0.0–0.4)
Eos: 3 %
Hematocrit: 40.5 % (ref 34.0–46.6)
Hemoglobin: 13.4 g/dL (ref 11.1–15.9)
Immature Grans (Abs): 0 10*3/uL (ref 0.0–0.1)
Immature Granulocytes: 0 %
Lymphocytes Absolute: 4.2 10*3/uL — ABNORMAL HIGH (ref 0.7–3.1)
Lymphs: 43 %
MCH: 28.6 pg (ref 26.6–33.0)
MCHC: 33.1 g/dL (ref 31.5–35.7)
MCV: 87 fL (ref 79–97)
Monocytes Absolute: 1 10*3/uL — ABNORMAL HIGH (ref 0.1–0.9)
Monocytes: 10 %
Neutrophils Absolute: 4.3 10*3/uL (ref 1.4–7.0)
Neutrophils: 43 %
Platelets: 279 10*3/uL (ref 150–450)
RBC: 4.68 x10E6/uL (ref 3.77–5.28)
RDW: 12.9 % (ref 11.7–15.4)
WBC: 10 10*3/uL (ref 3.4–10.8)

## 2018-11-01 LAB — CMP14+EGFR
ALT: 20 IU/L (ref 0–32)
AST: 25 IU/L (ref 0–40)
Albumin/Globulin Ratio: 1.4 (ref 1.2–2.2)
Albumin: 4.2 g/dL (ref 3.7–4.7)
Alkaline Phosphatase: 71 IU/L (ref 39–117)
BUN/Creatinine Ratio: 18 (ref 12–28)
BUN: 21 mg/dL (ref 8–27)
Bilirubin Total: 0.4 mg/dL (ref 0.0–1.2)
CO2: 26 mmol/L (ref 20–29)
Calcium: 9.5 mg/dL (ref 8.7–10.3)
Chloride: 102 mmol/L (ref 96–106)
Creatinine, Ser: 1.16 mg/dL — ABNORMAL HIGH (ref 0.57–1.00)
GFR calc Af Amer: 54 mL/min/{1.73_m2} — ABNORMAL LOW (ref >59)
GFR calc non Af Amer: 46 mL/min/{1.73_m2} — ABNORMAL LOW (ref >59)
Globulin, Total: 3.1 g/dL (ref 1.5–4.5)
Glucose: 98 mg/dL (ref 65–99)
Potassium: 4.2 mmol/L (ref 3.5–5.2)
Sodium: 142 mmol/L (ref 134–144)
Total Protein: 7.3 g/dL (ref 6.0–8.5)

## 2018-11-14 ENCOUNTER — Ambulatory Visit: Payer: Medicare Other

## 2018-11-23 ENCOUNTER — Other Ambulatory Visit: Payer: Self-pay

## 2018-11-23 DIAGNOSIS — Z20822 Contact with and (suspected) exposure to covid-19: Secondary | ICD-10-CM

## 2018-11-26 LAB — NOVEL CORONAVIRUS, NAA: SARS-CoV-2, NAA: NOT DETECTED

## 2018-11-27 ENCOUNTER — Telehealth: Payer: Self-pay | Admitting: Family Medicine

## 2018-11-27 NOTE — Telephone Encounter (Signed)
Negative COVID results given. Patient results "NOT Detected." Caller expressed understanding. ° °

## 2019-03-01 ENCOUNTER — Ambulatory Visit: Payer: Medicare Other | Admitting: Family Medicine

## 2019-03-08 ENCOUNTER — Other Ambulatory Visit: Payer: Self-pay

## 2019-03-11 ENCOUNTER — Ambulatory Visit (INDEPENDENT_AMBULATORY_CARE_PROVIDER_SITE_OTHER): Payer: Medicare Other

## 2019-03-11 ENCOUNTER — Encounter: Payer: Self-pay | Admitting: Family Medicine

## 2019-03-11 ENCOUNTER — Other Ambulatory Visit: Payer: Self-pay

## 2019-03-11 ENCOUNTER — Ambulatory Visit (INDEPENDENT_AMBULATORY_CARE_PROVIDER_SITE_OTHER): Payer: Medicare Other | Admitting: Family Medicine

## 2019-03-11 VITALS — BP 128/74 | HR 78 | Temp 97.1°F | Resp 20 | Ht 65.0 in | Wt 165.0 lb

## 2019-03-11 DIAGNOSIS — Z13820 Encounter for screening for osteoporosis: Secondary | ICD-10-CM

## 2019-03-11 DIAGNOSIS — I10 Essential (primary) hypertension: Secondary | ICD-10-CM | POA: Diagnosis not present

## 2019-03-11 DIAGNOSIS — K219 Gastro-esophageal reflux disease without esophagitis: Secondary | ICD-10-CM

## 2019-03-11 DIAGNOSIS — N1832 Chronic kidney disease, stage 3b: Secondary | ICD-10-CM | POA: Diagnosis not present

## 2019-03-11 DIAGNOSIS — E782 Mixed hyperlipidemia: Secondary | ICD-10-CM

## 2019-03-11 DIAGNOSIS — Z780 Asymptomatic menopausal state: Secondary | ICD-10-CM

## 2019-03-11 DIAGNOSIS — Z79899 Other long term (current) drug therapy: Secondary | ICD-10-CM

## 2019-03-11 DIAGNOSIS — F5101 Primary insomnia: Secondary | ICD-10-CM

## 2019-03-11 MED ORDER — ZOLPIDEM TARTRATE 5 MG PO TABS: 5.0000 mg | ORAL_TABLET | Freq: Every evening | ORAL | 5 refills | Status: DC | PRN

## 2019-03-11 NOTE — Progress Notes (Signed)
Subjective:  Patient ID: Stacy Henry, female    DOB: [birth year], 74 y.o.   MRN: [id number]  Patient Care Team: Baruch Gouty, FNP as PCP - General (Family Medicine) Burnell Blanks, MD as PCP - Cardiology (Cardiology) Druscilla Brownie, MD as Consulting Physician (Dermatology) Gala Romney Cristopher Estimable, MD as Consulting Physician (Gastroenterology)   Chief Complaint:  Medical Management of Chronic Issues (4 mo ), Hypertension, and Hyperlipidemia   HPI: Stacy Henry is a 74 y.o. female presenting on 03/11/2019 for follow up of chronic problems.   1. Mixed hyperlipidemia Compliant with medications -yes Current medications - Pravastatin 40mg  nightly Side effects from medications - denies any medication side effects such as myalgias.  Diet - watches diet, avoids fried, fatty, greasy foods, eats diet high in fruits and vegetables  Exercise - no current planned exercise regimen but is up doing activities during the day and avoiding being sedentary  Lab Results  Component Value Date   CHOL 188 07/26/2018   HDL 57 07/26/2018   LDLCALC 94 07/26/2018   TRIG 184 (H) 07/26/2018   CHOLHDL 3.3 07/26/2018     Family and personal medical history reviewed. Smoking and ETOH history reviewed.   2. Essential hypertension Currently on losartan 100mg  daily. Checks her bp occasionally, reports readings around 120/75, consistently. Denies dizziness, chest pain, palpitations, shortness of breath, headache, lightheadedness or visual changes.   3. Gastroesophageal reflux disease without esophagitis On protonix daily with good result. States she occasionally needs tums if she misses her dose of protonix but when she is completley compliant with medications her symptoms are well controlled. Denies sore throat, voice changes, cough, melena, dysphagia or nausea.   4. Chronic kidney disease (CKD) stage G3a/A1, moderately decreased glomerular filtration rate (GFR) between 30-44 mL/min/1.73  square meter and albuminuria creatinine ratio less than 30 mg/g She denies any decrease in urination, confusion, edema, chest pain, or sob.    5. Insomnia Reports she is unable to fall asleep until late at night/early morning, every night. She also reports difficulty staying asleep during the night. Reports she reads before bed and avoids electronics and screens as well as daytime /napping. States that she has taken prescribed trazadone as well as melatonin and tylenol PM but still is unable to fall asleep without difficulty. Has used ambien in the past with good result.   Relevant past medical, surgical, family, and social history reviewed and updated as indicated.  Allergies and medications reviewed and updated. Date reviewed: Chart in Epic.   Past Medical History:  Diagnosis Date  . Allergy   . GERD (gastroesophageal reflux disease)   . HTN (hypertension)   . Hyperlipidemia   . Insomnia   . Ischemic colitis ([place]) 2011  . Osteopenia     Past Surgical History:  Procedure Laterality Date  . APPENDECTOMY  as teenager  . BREAST SURGERY Bilateral    "lump"removal  . COLONOSCOPY  2006   RMR: normal.  . COLONOSCOPY  2012   [facility]: most c/w ischemic colitis, no path  . COLONOSCOPY N/A 12/03/2014   Procedure: COLONOSCOPY;  Surgeon: Daneil Dolin, MD;  Location: [facility];  Service: Endoscopy;  Laterality: N/A;  0900  . EYE SURGERY Bilateral   . FINGER SURGERY      Social History   Socioeconomic History  . Marital status: Married    Spouse name: Stacy Henry  . Number of children: 1  . Years of education: 63  . Highest  education level: Some college, no degree  Occupational History  . Occupation: Electronics engineer  Tobacco Use  . Smoking status: Never Smoker  . Smokeless tobacco: Never Used  Substance and Sexual Activity  . Alcohol use: Yes    Comment: ocassional wine - less than weekly  . Drug use: No  . Sexual activity: Yes    Partners: Male     Birth control/protection: Post-menopausal  Other Topics Concern  . Not on file  Social History Narrative  . Not on file   Social Determinants of Health   Financial Resource Strain: Low Risk   . Difficulty of Paying Living Expenses: Not hard at all  Food Insecurity: No Food Insecurity  . Worried About Charity fundraiser in the Last Year: Never true  . Ran Out of Food in the Last Year: Never true  Transportation Needs: No Transportation Needs  . Lack of Transportation (Medical): No  . Lack of Transportation (Non-Medical): No  Physical Activity: Inactive  . Days of Exercise per Week: 0 days  . Minutes of Exercise per Session: 0 min  Stress: No Stress Concern Present  . Feeling of Stress : Not at all  Social Connections: Not Isolated  . Frequency of Communication with Friends and Family: More than three times a week  . Frequency of Social Gatherings with Friends and Family: More than three times a week  . Attends Religious Services: More than 4 times per year  . Active Member of Clubs or Organizations: Yes  . Attends Archivist Meetings: More than 4 times per year  . Marital Status: Married  Human resources officer Violence: Not At Risk  . Fear of Current or Ex-Partner: No  . Emotionally Abused: No  . Physically Abused: No  . Sexually Abused: No    Outpatient Encounter Medications as of 03/11/2019  Medication Sig  . b complex vitamins tablet Take 1 tablet by mouth daily.  . Biotin w/ Vitamins C & E (HAIR/SKIN/NAILS PO) Take 1 tablet by mouth daily.  . Calcium Carbonate-Vit D-Min (CALCIUM 1200 PO) Take by mouth. One a day  . fluticasone (FLONASE) 50 MCG/ACT nasal spray Place 2 sprays into both nostrils daily.  Marland Kitchen levocetirizine (XYZAL) 5 MG tablet Take 1 tablet (5 mg total) by mouth every evening.  Marland Kitchen losartan (COZAAR) 100 MG tablet Take 1 tablet (100 mg total) by mouth daily. (Needs to be seen before next refill)  . Melatonin 10 MG SUBL Place 10 mg under the tongue.  .  pantoprazole (PROTONIX) 40 MG tablet Take 1 tablet (40 mg total) by mouth daily. (Needs to be seen before next refill)  . pravastatin (PRAVACHOL) 40 MG tablet Take 1 tablet (40 mg total) by mouth at bedtime.  . valACYclovir (VALTREX) 1000 MG tablet 2gm twice a day for 24h for symptoms (Patient not taking: Reported on 10/31/2018)  . [DISCONTINUED] chlorthalidone (HYGROTON) 25 MG tablet Take 1 tablet (25 mg total) by mouth daily.  . [DISCONTINUED] fexofenadine-pseudoephedrine (ALLEGRA-D) 60-120 MG 12 hr tablet Take 1 tablet by mouth daily.   No facility-administered encounter medications on file as of 03/11/2019.    Allergies  Allergen Reactions  . Sulfa Antibiotics     Review of Systems  Constitutional: Negative for activity change, appetite change, fatigue and unexpected weight change.  HENT: Negative.   Eyes: Negative.   Respiratory: Negative for cough, chest tightness and shortness of breath.   Cardiovascular: Negative for chest pain, palpitations and leg swelling.  Gastrointestinal: Negative for abdominal  pain, constipation, diarrhea and nausea.  Endocrine: Negative for cold intolerance, heat intolerance, polydipsia and polyphagia.  Genitourinary: Negative for decreased urine volume, difficulty urinating and frequency.  Musculoskeletal: Negative for arthralgias, gait problem and myalgias.  Skin: Negative for color change.  Allergic/Immunologic: Negative.   Neurological: Negative for dizziness, weakness, light-headedness, numbness and headaches.  Hematological: Negative.   Psychiatric/Behavioral: Positive for sleep disturbance. Negative for behavioral problems, confusion and suicidal ideas. The patient is not nervous/anxious.   All other systems reviewed and are negative.       Objective:  BP 128/74   Pulse 78   Temp (!) 97.1 F (36.2 C)   Resp 20   Ht 5\' 5"  (1.651 m)   Wt 165 lb (74.8 kg)   SpO2 99%   BMI 27.46 kg/m    Wt Readings from Last 3 Encounters:  03/11/19 165  lb (74.8 kg)  10/31/18 164 lb (74.4 kg)  07/26/18 162 lb (73.5 kg)    Physical Exam Vitals and nursing note reviewed.  Constitutional:      Appearance: Normal appearance. She is normal weight.  HENT:     Head: Normocephalic and atraumatic.     Nose: Nose normal.     Mouth/Throat:     Mouth: Mucous membranes are moist.  Eyes:     Pupils: Pupils are equal, round, and reactive to light.  Cardiovascular:     Rate and Rhythm: Normal rate and regular rhythm.     Pulses: Normal pulses.     Heart sounds: Normal heart sounds.  Pulmonary:     Effort: Pulmonary effort is normal.     Breath sounds: Normal breath sounds.  Abdominal:     General: Abdomen is flat. Bowel sounds are normal.     Palpations: Abdomen is soft.  Musculoskeletal:        General: No swelling. Normal range of motion.     Cervical back: Normal range of motion and neck supple.  Skin:    General: Skin is warm and dry.     Capillary Refill: Capillary refill takes less than 2 seconds.     Findings: No rash.  Neurological:     General: No focal deficit present.     Mental Status: She is alert and oriented to person, place, and time. Mental status is at baseline.  Psychiatric:        Mood and Affect: Mood normal.        Behavior: Behavior normal.        Thought Content: Thought content normal.        Judgment: Judgment normal.     Results for orders placed or performed in visit on 11/23/18  Novel Coronavirus, NAA ([facility])   Specimen: Nasopharyngeal(NP) swabs in vial transport medium   NASOPHARYNGE  TESTING  Result Value Ref Range   SARS-CoV-2, NAA Not Detected Not Detected       Pertinent labs & imaging results that were available during my care of the patient were reviewed by me and considered in my medical decision making.  Assessment & Plan:  Ethelle was seen today for medical management of chronic issues, hypertension and hyperlipidemia.  Diagnoses and all orders for this visit:  Mixed hyperlipidemia  Discussed checking BP 2-3x/week at the same time each day. Continue current medication regimen with good result.   Essential hypertension -     Comprehensive metabolic panel Continue current medication regimen with good result  Gastroesophageal reflux disease without esophagitis Continue current med regimen with good  result  Chronic kidney disease (CKD) stage G3b/A1, moderately decreased glomerular filtration rate (GFR) between 30-44 mL/min/1.73 square meter and albuminuria creatinine ratio less than 30 mg/g -     Comprehensive metabolic panel  Primary insomnia -     Thyroid Panel With TSH -     ToxASSURE Select 13 (MW), Urine Rx Ambien 5mg  nightly  Screening for osteoporosis -     DG [facility] DEXA; Future  Postmenopausal -     DG [facility] DEXA; Future  Controlled substance agreement signed -     ToxASSURE Select 13 (MW), Urine     Continue all other maintenance medications.  Follow up plan: Return in about 4 months (around 07/11/2019), or if symptoms worsen or fail to improve.  Continue healthy lifestyle choices, including diet (rich in fruits, vegetables, and lean proteins, and low in salt and simple carbohydrates) and exercise (at least 30 minutes of moderate physical activity daily).   The above assessment and management plan was discussed with the patient. The patient verbalized understanding of and has agreed to the management plan. Patient is aware to call the clinic if they develop any new symptoms or if symptoms persist or worsen. Patient is aware when to return to the clinic for a follow-up visit. Patient educated on when it is appropriate to go to the emergency department.     Scherrie Gerlach, BSN, RN, AGNP-Student  I personally was present during the history, physical exam, and medical decision-making activities of this service and have verified that the service and findings are accurately documented in the nurse practitioner student's note.  Monia Pouch, FNP-C  [facility] [address] [place], [place] [address] [phone]

## 2019-03-12 LAB — COMPREHENSIVE METABOLIC PANEL
ALT: 19 IU/L (ref 0–32)
AST: 29 IU/L (ref 0–40)
Albumin/Globulin Ratio: 1.7 (ref 1.2–2.2)
Albumin: 4.5 g/dL (ref 3.7–4.7)
Alkaline Phosphatase: 78 IU/L (ref 39–117)
BUN/Creatinine Ratio: 15 (ref 12–28)
BUN: 15 mg/dL (ref 8–27)
Bilirubin Total: 0.3 mg/dL (ref 0.0–1.2)
CO2: 26 mmol/L (ref 20–29)
Calcium: 9.5 mg/dL (ref 8.7–10.3)
Chloride: 104 mmol/L (ref 96–106)
Creatinine, Ser: 1.03 mg/dL — ABNORMAL HIGH (ref 0.57–1.00)
GFR calc Af Amer: 61 mL/min/{1.73_m2} (ref >59)
GFR calc non Af Amer: 53 mL/min/{1.73_m2} — ABNORMAL LOW (ref >59)
Globulin, Total: 2.7 g/dL (ref 1.5–4.5)
Glucose: 73 mg/dL (ref 65–99)
Potassium: 4.4 mmol/L (ref 3.5–5.2)
Sodium: 142 mmol/L (ref 134–144)
Total Protein: 7.2 g/dL (ref 6.0–8.5)

## 2019-03-12 LAB — THYROID PANEL WITH TSH
Free Thyroxine Index: 2.5 (ref 1.2–4.9)
T3 Uptake Ratio: 27 % (ref 24–39)
T4, Total: 9.1 ug/dL (ref 4.5–12.0)
TSH: 1.6 u[IU]/mL (ref 0.450–4.500)

## 2019-03-15 LAB — TOXASSURE SELECT 13 (MW), URINE

## 2019-03-26 ENCOUNTER — Other Ambulatory Visit: Payer: Self-pay | Admitting: Family Medicine

## 2019-03-26 DIAGNOSIS — H6593 Unspecified nonsuppurative otitis media, bilateral: Secondary | ICD-10-CM

## 2019-04-01 ENCOUNTER — Telehealth: Payer: Self-pay | Admitting: Family Medicine

## 2019-04-01 NOTE — Chronic Care Management (AMB) (Signed)
  Chronic Care Management   Note  04/01/2019 Name: Tayla Panozzo MRN: [id number] DOB: 1945/01/25  Kasidi Shanker is a 74 y.o. year old female who is a primary care patient of Rakes, Connye Burkitt, FNP. I reached out to [name] by phone today in response to a referral sent by Ms. Georga Bora Black's health plan.     Ms. Null was given information about Chronic Care Management services today including:  1. CCM service includes personalized support from designated clinical staff supervised by her physician, including individualized plan of care and coordination with other care providers 2. 24/7 contact phone numbers for assistance for urgent and routine care needs. 3. Service will only be billed when office clinical staff spend 20 minutes or more in a month to coordinate care. 4. Only one practitioner may furnish and bill the service in a calendar month. 5. The patient may stop CCM services at any time (effective at the end of the month) by phone call to the office staff. 6. The patient will be responsible for cost sharing (co-pay) of up to 20% of the service fee (after annual deductible is met).  Patient did not agree to enrollment in care management services and does not wish to consider at this time.  Follow up plan: The patient has been provided with contact information for the care management team and has been advised to call with any health related questions or concerns.   [place], [place] [address] Direct Dial: [phone] [email or web address]

## 2019-04-12 ENCOUNTER — Ambulatory Visit (INDEPENDENT_AMBULATORY_CARE_PROVIDER_SITE_OTHER): Payer: Medicare Other | Admitting: Family Medicine

## 2019-04-12 DIAGNOSIS — N3001 Acute cystitis with hematuria: Secondary | ICD-10-CM | POA: Diagnosis not present

## 2019-04-12 LAB — URINALYSIS, COMPLETE
Bilirubin, UA: NEGATIVE
Nitrite, UA: POSITIVE — AB
RBC, UA: NEGATIVE
Specific Gravity, UA: <1.005 — ABNORMAL LOW (ref 1.005–1.030)
Urobilinogen, Ur: >8 mg/dL — ABNORMAL HIGH (ref 0.2–1.0)
pH, UA: 8.5 — ABNORMAL HIGH (ref 5.0–7.5)

## 2019-04-12 LAB — MICROSCOPIC EXAMINATION: WBC, UA: >30 /hpf — AB (ref 0–5)

## 2019-04-12 MED ORDER — CIPROFLOXACIN HCL 500 MG PO TABS: 500.0000 mg | ORAL_TABLET | Freq: Two times a day (BID) | ORAL | 0 refills | Status: AC

## 2019-04-12 NOTE — Progress Notes (Signed)
Telephone visit  Subjective: CC: UTI PCP: Baruch Gouty, FNP [id number] Stacy Henry is a 74 y.o. female calls for telephone consult today. Patient provides verbal consent for consult held via phone.  Due to COVID-19 pandemic this visit was conducted virtually. This visit type was conducted due to national recommendations for restrictions regarding the COVID-19 Pandemic (e.g. social distancing, sheltering in place) in an effort to limit this patient's exposure and mitigate transmission in our community. All issues noted in this document were discussed and addressed.  A physical exam was not performed with this format.   Location of patient: home Location of provider: [facility] Others present for call: none  1. Urinary symptoms Patient reports a late yesterday h/o dysuria and pelvic pain.  Denies urinary frequency, urgency, hematuria, fevers, chills, abdominal pain, nausea, vomiting, back pain, vaginal discharge.  Patient has used AZO for symptoms.  Patient reports 2 UTIs in the last year.      ROS: Per HPI  Allergies  Allergen Reactions  . Sulfa Antibiotics    Past Medical History:  Diagnosis Date  . Allergy   . GERD (gastroesophageal reflux disease)   . HTN (hypertension)   . Hyperlipidemia   . Insomnia   . Ischemic colitis ([place]) 2011  . Osteopenia     Current Outpatient Medications:  .  b complex vitamins tablet, Take 1 tablet by mouth daily., Disp: , Rfl:  .  Biotin w/ Vitamins C & E (HAIR/SKIN/NAILS PO), Take 1 tablet by mouth daily., Disp: , Rfl:  .  Calcium Carbonate-Vit D-Min (CALCIUM 1200 PO), Take by mouth. One a day, Disp: , Rfl:  .  fluticasone (FLONASE) 50 MCG/ACT nasal spray, Place 2 sprays into both nostrils daily., Disp: 16 g, Rfl: 6 .  levocetirizine (XYZAL) 5 MG tablet, TAKE 1 TABLET IN THE EVENING, Disp: 30 tablet, Rfl: 0 .  losartan (COZAAR) 100 MG tablet, Take 1 tablet (100 mg total) by mouth daily. (Needs to be seen before next refill), Disp: 90 tablet, Rfl:  1 .  Melatonin 10 MG SUBL, Place 10 mg under the tongue., Disp: , Rfl:  .  pantoprazole (PROTONIX) 40 MG tablet, Take 1 tablet (40 mg total) by mouth daily. (Needs to be seen before next refill), Disp: 90 tablet, Rfl: 3 .  pravastatin (PRAVACHOL) 40 MG tablet, Take 1 tablet (40 mg total) by mouth at bedtime., Disp: 90 tablet, Rfl: 1 .  valACYclovir (VALTREX) 1000 MG tablet, 2gm twice a day for 24h for symptoms (Patient not taking: Reported on 10/31/2018), Disp: 20 tablet, Rfl: 2 .  zolpidem (AMBIEN) 5 MG tablet, Take 1 tablet (5 mg total) by mouth at bedtime as needed for sleep., Disp: 30 tablet, Rfl: 5  Assessment/ Plan: 74 y.o. female   1. Acute cystitis with hematuria Suspected UTI.  Empiric treatment with Cipro 500 mg twice daily for 3 days.  She was provided 5 days just in case there is an entire weekend.  We discussed red flag symptoms that would warrant further evaluation.  She will come in and provide a urine sample.  Push oral fluids.  She will use yogurt for probiotic and yeast prevention.  We will contact once urine culture results. - ciprofloxacin (CIPRO) 500 MG tablet; Take 1 tablet (500 mg total) by mouth 2 (two) times daily for 5 days.  Dispense: 10 tablet; Refill: 0 - Urinalysis, Complete - Urine Culture   Start time: 1:32pm End time: 1:36pm  Total time spent on patient care (including telephone call/  virtual visit): 10 minutes  Janora Norlander, [place] [phone]

## 2019-04-14 LAB — URINE CULTURE

## 2019-04-19 ENCOUNTER — Other Ambulatory Visit: Payer: Self-pay | Admitting: Family Medicine

## 2019-04-19 DIAGNOSIS — B351 Tinea unguium: Secondary | ICD-10-CM

## 2019-05-01 ENCOUNTER — Other Ambulatory Visit: Payer: Self-pay | Admitting: *Deleted

## 2019-05-01 DIAGNOSIS — E782 Mixed hyperlipidemia: Secondary | ICD-10-CM

## 2019-05-01 DIAGNOSIS — H6593 Unspecified nonsuppurative otitis media, bilateral: Secondary | ICD-10-CM

## 2019-05-01 MED ORDER — LEVOCETIRIZINE DIHYDROCHLORIDE 5 MG PO TABS: 5.0000 mg | ORAL_TABLET | Freq: Every evening | ORAL | 5 refills | Status: DC

## 2019-05-01 MED ORDER — PRAVASTATIN SODIUM 40 MG PO TABS: 40.0000 mg | ORAL_TABLET | Freq: Every day | ORAL | 1 refills | Status: DC

## 2019-05-01 NOTE — Addendum Note (Signed)
Addended by: Antonietta Barcelona D on: 05/01/2019 11:22 AM   Modules accepted: Orders

## 2019-05-16 ENCOUNTER — Other Ambulatory Visit: Payer: Self-pay | Admitting: Family Medicine

## 2019-05-16 DIAGNOSIS — B351 Tinea unguium: Secondary | ICD-10-CM

## 2019-06-20 ENCOUNTER — Other Ambulatory Visit: Payer: Self-pay | Admitting: Family Medicine

## 2019-06-20 DIAGNOSIS — B351 Tinea unguium: Secondary | ICD-10-CM

## 2019-06-21 ENCOUNTER — Telehealth: Payer: Self-pay | Admitting: Family Medicine

## 2019-06-21 NOTE — Telephone Encounter (Signed)
Patient aware she will need to be seen.  Patient states that she will not be coming into this office for a refill on Lamisil.  Explained to patient the importance and patient continues to decline and states that she is not coming in and if doctor will not refill this medication that she is 74 years old and doesn't "need this crap" so she will just find another doctors office.

## 2019-06-21 NOTE — Telephone Encounter (Signed)
Patient is upset because we are not refilling the medication and are asking her to come in to be seen and have LFT's.  I explained to the patient the importance of having LFT's while on this medication but she still does not want to come in and said she may call back later to schedule an appointment.

## 2019-06-21 NOTE — Telephone Encounter (Signed)
It is NOT recommended to continue this medication without liver function tests.  She has not had these, so it will not be refilled.  She is most certainly welcome to seek care elsewhere.

## 2019-06-21 NOTE — Telephone Encounter (Signed)
Last office visit 04/12/2019 Last refill 05/16/2019, #30, no refills

## 2019-06-21 NOTE — Telephone Encounter (Signed)
This is not intended to be a long term medication. Additionally, no recent LFTs since on medication.  No refills.  Needs to be seen.

## 2019-07-12 ENCOUNTER — Ambulatory Visit (INDEPENDENT_AMBULATORY_CARE_PROVIDER_SITE_OTHER): Payer: Medicare Other | Admitting: Family Medicine

## 2019-07-12 ENCOUNTER — Other Ambulatory Visit: Payer: Self-pay

## 2019-07-12 ENCOUNTER — Encounter: Payer: Self-pay | Admitting: Family Medicine

## 2019-07-12 VITALS — BP 119/69 | HR 85 | Temp 97.2°F | Ht 65.0 in | Wt 161.4 lb

## 2019-07-12 DIAGNOSIS — Z7689 Persons encountering health services in other specified circumstances: Secondary | ICD-10-CM

## 2019-07-12 DIAGNOSIS — B351 Tinea unguium: Secondary | ICD-10-CM | POA: Diagnosis not present

## 2019-07-12 DIAGNOSIS — E782 Mixed hyperlipidemia: Secondary | ICD-10-CM

## 2019-07-12 DIAGNOSIS — N1832 Chronic kidney disease, stage 3b: Secondary | ICD-10-CM | POA: Diagnosis not present

## 2019-07-12 DIAGNOSIS — K219 Gastro-esophageal reflux disease without esophagitis: Secondary | ICD-10-CM

## 2019-07-12 DIAGNOSIS — J301 Allergic rhinitis due to pollen: Secondary | ICD-10-CM

## 2019-07-12 DIAGNOSIS — I10 Essential (primary) hypertension: Secondary | ICD-10-CM

## 2019-07-12 DIAGNOSIS — R109 Unspecified abdominal pain: Secondary | ICD-10-CM

## 2019-07-12 DIAGNOSIS — F5101 Primary insomnia: Secondary | ICD-10-CM

## 2019-07-12 DIAGNOSIS — Z79899 Other long term (current) drug therapy: Secondary | ICD-10-CM

## 2019-07-12 LAB — URINALYSIS
Bilirubin, UA: NEGATIVE
Glucose, UA: NEGATIVE
Ketones, UA: NEGATIVE
Leukocytes,UA: NEGATIVE
Nitrite, UA: NEGATIVE
Protein,UA: NEGATIVE
RBC, UA: NEGATIVE
Specific Gravity, UA: 1.02 (ref 1.005–1.030)
Urobilinogen, Ur: 0.2 mg/dL (ref 0.2–1.0)
pH, UA: 6 (ref 5.0–7.5)

## 2019-07-12 MED ORDER — LEVOCETIRIZINE DIHYDROCHLORIDE 5 MG PO TABS: 5.0000 mg | ORAL_TABLET | Freq: Every evening | ORAL | 3 refills | Status: DC

## 2019-07-12 MED ORDER — PANTOPRAZOLE SODIUM 40 MG PO TBEC: 40.0000 mg | DELAYED_RELEASE_TABLET | Freq: Every day | ORAL | 3 refills | Status: DC

## 2019-07-12 MED ORDER — LOSARTAN POTASSIUM 50 MG PO TABS: 50.0000 mg | ORAL_TABLET | Freq: Every day | ORAL | 3 refills | Status: DC

## 2019-07-12 NOTE — Progress Notes (Signed)
Subjective: CC:est care, insomnia, onychomycosis PCP: Janora Norlander, DO MIW:[id number] Stacy Henry is a 74 y.o. female presenting to clinic today for:  1.  Insomnia Patient reports she has been using the Ambien intermittently in efforts to sleep.  She denies any daytime napping, caffeine use.  She has had difficulty falling asleep since retirement.  She does not really want to try anything stronger or go up on the dose because she does not want to really be on medications for sleep.  2.  Onychomycosis Patient was being treated with Lamisil for nail fungus.  She has taken a total about 6 months worth of Lamisil with some improvement in the nail fungus.  It does seem to be growing out but is still present.  3.  Hypertension Patient reports compliance with losartan.  She takes 50 mg of this daily.  No chest pain, shortness of breath, headaches, lower extremity IMA.  4.  Left-sided rib pain Patient reports that she developed acute onset of left-sided rib pain.  She points to the costovertebral angle on the left.  Denies any dysuria, hematuria, fevers, vomiting.  ROS: Per HPI  Allergies  Allergen Reactions  . Sulfa Antibiotics    Past Medical History:  Diagnosis Date  . Allergy   . GERD (gastroesophageal reflux disease)   . HTN (hypertension)   . Hyperlipidemia   . Insomnia   . Ischemic colitis ([place]) 2011  . Osteopenia     Current Outpatient Medications:  .  b complex vitamins tablet, Take 1 tablet by mouth daily., Disp: , Rfl:  .  Biotin w/ Vitamins C & E (HAIR/SKIN/NAILS PO), Take 1 tablet by mouth daily., Disp: , Rfl:  .  Calcium Carbonate-Vit D-Min (CALCIUM 1200 PO), Take by mouth. One a day, Disp: , Rfl:  .  fluticasone (FLONASE) 50 MCG/ACT nasal spray, Place 2 sprays into both nostrils daily., Disp: 16 g, Rfl: 6 .  levocetirizine (XYZAL) 5 MG tablet, Take 1 tablet (5 mg total) by mouth every evening., Disp: 30 tablet, Rfl: 5 .  losartan (COZAAR) 100 MG tablet, Take 1  tablet (100 mg total) by mouth daily. (Needs to be seen before next refill), Disp: 90 tablet, Rfl: 1 .  Melatonin 10 MG SUBL, Place 10 mg under the tongue., Disp: , Rfl:  .  pantoprazole (PROTONIX) 40 MG tablet, Take 1 tablet (40 mg total) by mouth daily. (Needs to be seen before next refill), Disp: 90 tablet, Rfl: 3 .  pravastatin (PRAVACHOL) 40 MG tablet, Take 1 tablet (40 mg total) by mouth at bedtime., Disp: 90 tablet, Rfl: 1 .  terbinafine (LAMISIL) 250 MG tablet, TAKE 1 TABLET DAILY, Disp: 30 tablet, Rfl: 0 .  valACYclovir (VALTREX) 1000 MG tablet, 2gm twice a day for 24h for symptoms (Patient not taking: Reported on 10/31/2018), Disp: 20 tablet, Rfl: 2 .  zolpidem (AMBIEN) 5 MG tablet, Take 1 tablet (5 mg total) by mouth at bedtime as needed for sleep., Disp: 30 tablet, Rfl: 5 Social History   Socioeconomic History  . Marital status: Married    Spouse name: Kyung Rudd  . Number of children: 1  . Years of education: 3  . Highest education level: Some college, no degree  Occupational History  . Occupation: Electronics engineer  Tobacco Use  . Smoking status: Never Smoker  . Smokeless tobacco: Never Used  Vaping Use  . Vaping Use: Never used  Substance and Sexual Activity  . Alcohol use: Yes    Comment: ocassional wine -  less than weekly  . Drug use: No  . Sexual activity: Yes    Partners: Male    Birth control/protection: Post-menopausal  Other Topics Concern  . Not on file  Social History Narrative  . Not on file   Social Determinants of Health   Financial Resource Strain: Low Risk   . Difficulty of Paying Living Expenses: Not hard at all  Food Insecurity: No Food Insecurity  . Worried About Charity fundraiser in the Last Year: Never true  . Ran Out of Food in the Last Year: Never true  Transportation Needs: No Transportation Needs  . Lack of Transportation (Medical): No  . Lack of Transportation (Non-Medical): No  Physical Activity: Inactive  . Days of Exercise per  Week: 0 days  . Minutes of Exercise per Session: 0 min  Stress: No Stress Concern Present  . Feeling of Stress : Not at all  Social Connections: Socially Integrated  . Frequency of Communication with Friends and Family: More than three times a week  . Frequency of Social Gatherings with Friends and Family: More than three times a week  . Attends Religious Services: More than 4 times per year  . Active Member of Clubs or Organizations: Yes  . Attends Archivist Meetings: More than 4 times per year  . Marital Status: Married  Human resources officer Violence: Not At Risk  . Fear of Current or Ex-Partner: No  . Emotionally Abused: No  . Physically Abused: No  . Sexually Abused: No   Family History  Problem Relation Age of Onset  . Alcohol abuse Sister   . Alcoholism Sister   . Diabetes Brother   . Heart attack Brother 35  . Aneurysm Brother   . Alcoholism Brother   . Diabetes Brother   . Cirrhosis Brother   . Alcohol abuse Brother   . Alcoholism Brother   . Hypertension Mother   . Atrial fibrillation Mother   . Macular degeneration Mother   . Heart attack Father 62  . Kidney disease Sister   . Atrial fibrillation Sister   . Colon cancer Neg Hx     Objective: Office vital signs reviewed. BP 119/69   Pulse 85   Temp (!) 97.2 F (36.2 C)   Ht '5\' 5"'  (1.651 m)   Wt 161 lb 6.4 oz (73.2 kg)   SpO2 97%   BMI 26.86 kg/m   Physical Examination:  General: Awake, alert, well nourished, No acute distress HEENT: Normal, sclera white Cardio: regular rate and rhythm, S1S2 heard, no murmurs appreciated Pulm: clear to auscultation bilaterally, no wheezes, rhonchi or rales; normal work of breathing on room air Extremities: warm, well perfused, No edema, cyanosis or clubbing; +2 pulses bilaterally Nails: Bilateral great toenails with onychomycotic changes, that appear to be growing out.  No skin maceration. MSK: no CVA TTP  Assessment/ Plan: 74 y.o. female   1.  Onychomycosis Advised against ongoing use of Lamisil.  Actually consulted with Dr. Irving Shows, our local podiatrist, and he recommended against ongoing use of Lamisil as the medication could be present in the bloodstream for up to a year after 54-monthcourse.  The nails should gradually grow out.  However, if continues to be an issue may need to consider topical [facility]  2. Primary insomnia Stable for now.  She does not wish to change medication just yet but we discussed consideration for Belsomra if this is an ongoing  issue.  She would be amenable to this.  3. Controlled substance agreement signed Controlled substance contract is in place  4. Chronic kidney disease (CKD) stage G3b/A1, moderately decreased glomerular filtration rate (GFR) between 30-44 mL/min/1.73 square meter and albuminuria creatinine ratio less than 30 mg/g Check renal function - CMP14+EGFR  5. Essential hypertension Controlled - CMP14+EGFR - losartan (COZAAR) 50 MG tablet; Take 1 tablet (50 mg total) by mouth daily. Put on hold.  Does not need yet  Dispense: 90 tablet; Refill: 3  6. Mixed hyperlipidemia Check fasting lipid panel - Lipid Panel - CMP14+EGFR - TSH  7. Establishing care with new doctor, encounter for  8. Gastroesophageal reflux disease without esophagitis Stable but not thoroughly discussed today - pantoprazole (PROTONIX) 40 MG tablet; Take 1 tablet (40 mg total) by mouth daily.  Dispense: 90 tablet; Refill: 3  9.  Allergic rhinitis - levocetirizine (XYZAL) 5 MG tablet; Take 1 tablet (5 mg total) by mouth every evening.  Dispense: 90 tablet; Refill: 3  10. Flank pain Check UA given the left-sided rib pain.  No other urinary symptoms - Urinalysis   No orders of the defined types were placed in this encounter.  No orders of the defined types were placed in this encounter.    Janora Norlander, DO [place] [phone]

## 2019-07-12 NOTE — Patient Instructions (Signed)
I'll get in touch with podiatry about the compound.  You had labs performed today.  You will be contacted with the results of the labs once they are available, usually in the next 3 business days for routine lab work.  If you have an active my chart account, they will be released to your MyChart.  If you prefer to have these labs released to you via telephone, please let us know.  If you had a pap smear or biopsy performed, expect to be contacted in about 7-10 days.

## 2019-07-13 LAB — CMP14+EGFR
ALT: 15 IU/L (ref 0–32)
AST: 20 IU/L (ref 0–40)
Albumin/Globulin Ratio: 1.4 (ref 1.2–2.2)
Albumin: 4.4 g/dL (ref 3.7–4.7)
Alkaline Phosphatase: 86 IU/L (ref 48–121)
BUN/Creatinine Ratio: 21 (ref 12–28)
BUN: 24 mg/dL (ref 8–27)
Bilirubin Total: 0.3 mg/dL (ref 0.0–1.2)
CO2: 23 mmol/L (ref 20–29)
Calcium: 9.9 mg/dL (ref 8.7–10.3)
Chloride: 101 mmol/L (ref 96–106)
Creatinine, Ser: 1.12 mg/dL — ABNORMAL HIGH (ref 0.57–1.00)
GFR calc Af Amer: 56 mL/min/{1.73_m2} — ABNORMAL LOW (ref >59)
GFR calc non Af Amer: 48 mL/min/{1.73_m2} — ABNORMAL LOW (ref >59)
Globulin, Total: 3.2 g/dL (ref 1.5–4.5)
Glucose: 82 mg/dL (ref 65–99)
Potassium: 4.6 mmol/L (ref 3.5–5.2)
Sodium: 139 mmol/L (ref 134–144)
Total Protein: 7.6 g/dL (ref 6.0–8.5)

## 2019-07-13 LAB — TSH: TSH: 1.12 u[IU]/mL (ref 0.450–4.500)

## 2019-07-13 LAB — LIPID PANEL
Chol/HDL Ratio: 3.6 ratio (ref 0.0–4.4)
Cholesterol, Total: 182 mg/dL (ref 100–199)
HDL: 51 mg/dL (ref >39)
LDL Chol Calc (NIH): 94 mg/dL (ref 0–99)
Triglycerides: 220 mg/dL — ABNORMAL HIGH (ref 0–149)
VLDL Cholesterol Cal: 37 mg/dL (ref 5–40)

## 2019-07-16 MED ORDER — ATORVASTATIN CALCIUM 40 MG PO TABS
40.0000 mg | ORAL_TABLET | Freq: Every day | ORAL | 0 refills | Status: DC
Start: 2019-07-16 — End: 2019-10-25

## 2019-07-16 NOTE — Addendum Note (Signed)
Addended by: Baldomero Lamy B on: 07/16/2019 10:01 AM   Modules accepted: Orders

## 2019-09-11 ENCOUNTER — Other Ambulatory Visit: Payer: Self-pay | Admitting: *Deleted

## 2019-09-11 DIAGNOSIS — Z79899 Other long term (current) drug therapy: Secondary | ICD-10-CM

## 2019-09-11 DIAGNOSIS — F5101 Primary insomnia: Secondary | ICD-10-CM

## 2019-09-13 ENCOUNTER — Other Ambulatory Visit: Payer: Self-pay | Admitting: Family Medicine

## 2019-09-13 ENCOUNTER — Telehealth: Payer: Self-pay | Admitting: Family Medicine

## 2019-09-13 DIAGNOSIS — Z79899 Other long term (current) drug therapy: Secondary | ICD-10-CM

## 2019-09-13 DIAGNOSIS — F5101 Primary insomnia: Secondary | ICD-10-CM

## 2019-09-13 MED ORDER — ZOLPIDEM TARTRATE 5 MG PO TABS: 5.0000 mg | ORAL_TABLET | Freq: Every evening | ORAL | 5 refills | Status: DC | PRN

## 2019-09-13 NOTE — Telephone Encounter (Signed)
Called patient; aware. 

## 2019-09-13 NOTE — Telephone Encounter (Signed)
This was discussed at that visit.  She wasn't quite due for fills at that time but I sent her a renewal today.

## 2019-09-13 NOTE — Telephone Encounter (Signed)
  Prescription Request  09/13/2019  What is the name of the medication or equipment? Zolpidem 5 mg . Had appt with Lajuana Ripple 07-12-19 and forgot to mention to [facility] she needed refills and has 6 month appt in January and medication was refused.  Have you contacted your pharmacy to request a refill? (if applicable) YES  Which pharmacy would you like this sent to? [place]  Patient notified that their request is being sent to the clinical staff for review and that they should receive a response within 2 business days.

## 2019-10-07 ENCOUNTER — Other Ambulatory Visit: Payer: Self-pay

## 2019-10-07 ENCOUNTER — Ambulatory Visit (INDEPENDENT_AMBULATORY_CARE_PROVIDER_SITE_OTHER): Payer: Medicare Other

## 2019-10-07 DIAGNOSIS — Z23 Encounter for immunization: Secondary | ICD-10-CM | POA: Diagnosis not present

## 2019-10-24 ENCOUNTER — Telehealth: Payer: Self-pay

## 2019-10-24 DIAGNOSIS — E782 Mixed hyperlipidemia: Secondary | ICD-10-CM

## 2019-10-24 NOTE — Telephone Encounter (Signed)
Patient states that she will not take the Lipitor since the side effects say it can cause stomach problems or give her trouble sleeping.  States she already has stomach problems and trouble sleeping.  Would like to know why she was changed from pravastatin to Lipitor ?

## 2019-10-25 MED ORDER — LIVALO 2 MG PO TABS: 1.0000 | ORAL_TABLET | Freq: Every day | ORAL | 2 refills | Status: DC

## 2019-10-25 NOTE — Telephone Encounter (Signed)
Attempted to contact - NA 

## 2019-10-25 NOTE — Telephone Encounter (Signed)
This change was discussed back in July.  Not sure what the confusion is.  The Pravastatin was insufficient. Hence the change.  If she is unable to tolerate the med, can reduce to 1/2 tablet or we can explore alternatives but she had almost a 19% chance of stoke and heart attack with the pravastatin.  If she is comfortable with those odds, she is most certainly welcome to resume previous statin but I would recommend an alternative.   Christia Reading, LPN  0/15/6153  7:94 AM EDT Back to Top    PATIENT AWARE AND MEDICATION CHANGE SENT TO PHARMACY   Stacy Norlander, DO  07/15/2019  5:34 PM EDT     Kidney function is impaired but this is stable from previous checks. Thyroid-stimulating hormone normal   Cholesterol is elevated.  10-year risk of stroke and heart attack is 18.3%.  If she can tolerate Lipitor, would recommend switching to Lipitor 40 mg daily.  Otherwise, please have her continue the Pravachol.   The 10-year ASCVD risk score Stacy Henry., et al., 2013) is: 18.3%   Values used to calculate the score:     Age: 74 years     Sex: Female     Is Non-Hispanic African American: No     Diabetic: No     Tobacco smoker: No     Systolic Blood Pressure: 327 mmHg     Is BP treated: Yes     HDL Cholesterol: 51 mg/dL     Total Cholesterol: 182 mg/dL

## 2019-10-25 NOTE — Telephone Encounter (Signed)
Patient aware and states she is willing to try another medication in place of the lipitor that has less side effects.  States she only has 4 of the pravastatin left. Offered appointment to discuss but there are no openings before patient runs out of medication. Please advise

## 2019-10-29 ENCOUNTER — Telehealth: Payer: Self-pay

## 2019-10-29 NOTE — Telephone Encounter (Signed)
Chart has been reviewed and attempted to reach pt LMTCB to Lead office # - [phone]

## 2019-10-29 NOTE — Telephone Encounter (Signed)
Attempted to reach this patient at [phone] and [phone] and was not able to reach her.  I have looked at the record.  There was a phone call made in July regarding her cholesterol medication and it clearly states she was amenable to change to lipitor.  However, it appears she was intolerant to med so resumed use of Pravastatin.  On 10/21, she called for refills and again, indicated she would be willing to switch cholesterol medications IF the side effects would be less than that of Lipitor and Pravastatin, hence the livalo rx.  I'm very confused as to where the break down in communication is, as both these nurse notes indicate patient either agreed or asked for a change.  She is most certainly welcome to switch to any other provider if she is dissatisfied with her care.

## 2019-10-29 NOTE — Telephone Encounter (Signed)
Returned patient's phone call.  Patient states that her medication has been changed multiple times without her being notified. Patient is very upset that her cholesterol medication has been changed multiple times without being informed.  Patient has also asked to speak with Dr. Lajuana Ripple regarding medication change and has been informed that Dr. Lajuana Ripple does not have time to call her.  Patient would like to switch Pcp but does not think it is right that she has to have Dr. Lajuana Ripple approval to switch to someone different when she didn't have a choice on who she saw to begin with.  Patient states that she was put with Monia Pouch, FNP and when Rakes left she was placed with Dr. Lajuana Ripple without her approval.  Patient states that if she can not decide which pcp she can see here at this office then she will just switch to an office.  Advised patient that this message will be sent to the nurse team lead.  Patient states that she would like to speak with the office manager.

## 2019-10-29 NOTE — Telephone Encounter (Signed)
Dr Lajuana Ripple - please advise - please review notes from 10/21 call.  It looks like pt went from Pravastatin to lipitor - to the Pitavastatin ? Please advise if this is appropriate change?  New med was sent in and message did not get back to the pool - patient was unaware and nurse was unaware to call her.     ( note - I haven't been able to reach pt yet)

## 2019-11-07 ENCOUNTER — Telehealth: Payer: Self-pay

## 2019-11-07 NOTE — Telephone Encounter (Signed)
I would really like patient to be seen by whoever she is switching to as soon as she is able.  I am very concerned about her confusion about our previous encounter and encounters with the other clinical staff.  From my last visit with her in July:   "1. Onychomycosis Advised against ongoing use of Lamisil.  Actually consulted with Dr. Irving Shows, our local podiatrist, and he recommended against ongoing use of Lamisil as the medication could be present in the bloodstream for up to a year after 99-month course.  The nails should gradually grow out.  However, if continues to be an issue may need to consider topical Lamisil which can be compounded at [facility] or [place]."

## 2019-11-07 NOTE — Telephone Encounter (Signed)
Patient would like to get her Lamisil refilled.  Provider said she would need labs first and she did have the labwork done.  Not sure why she has not heard or received refill on this.  Patient uses [facility].

## 2019-11-08 ENCOUNTER — Telehealth: Payer: Self-pay

## 2019-11-08 DIAGNOSIS — H25812 Combined forms of age-related cataract, left eye: Secondary | ICD-10-CM | POA: Diagnosis not present

## 2019-11-08 DIAGNOSIS — Z961 Presence of intraocular lens: Secondary | ICD-10-CM | POA: Diagnosis not present

## 2019-11-08 DIAGNOSIS — H52202 Unspecified astigmatism, left eye: Secondary | ICD-10-CM | POA: Diagnosis not present

## 2019-11-08 NOTE — Telephone Encounter (Signed)
lmtcb

## 2019-11-08 NOTE — Telephone Encounter (Signed)
Called patient to let her know that Dr. Lajuana Ripple is fine with patient switching to another provider.  Dr. Warrick Parisian has agreed to take patient on.  Left message for her to call us and schedule appt with Dr. Warrick Parisian.

## 2019-11-26 ENCOUNTER — Encounter: Payer: Self-pay | Admitting: Family

## 2019-11-26 ENCOUNTER — Ambulatory Visit (INDEPENDENT_AMBULATORY_CARE_PROVIDER_SITE_OTHER): Payer: Medicare Other

## 2019-11-26 ENCOUNTER — Ambulatory Visit (INDEPENDENT_AMBULATORY_CARE_PROVIDER_SITE_OTHER): Payer: Medicare Other | Admitting: Family

## 2019-11-26 ENCOUNTER — Other Ambulatory Visit: Payer: Self-pay

## 2019-11-26 VITALS — BP 181/87 | HR 87 | Temp 97.1°F | Wt 165.0 lb

## 2019-11-26 DIAGNOSIS — R059 Cough, unspecified: Secondary | ICD-10-CM

## 2019-11-26 DIAGNOSIS — Z20822 Contact with and (suspected) exposure to covid-19: Secondary | ICD-10-CM

## 2019-11-26 MED ORDER — DEXAMETHASONE 6 MG PO TABS: 6.0000 mg | ORAL_TABLET | Freq: Two times a day (BID) | ORAL | 0 refills | Status: AC

## 2019-11-26 MED ORDER — ALBUTEROL SULFATE HFA 108 (90 BASE) MCG/ACT IN AERS: 2.0000 | INHALATION_SPRAY | Freq: Four times a day (QID) | RESPIRATORY_TRACT | 2 refills | Status: DC | PRN

## 2019-11-26 NOTE — Progress Notes (Signed)
Subjective:    Patient ID: Stacy Henry, female    DOB: 1945/01/12, 74 y.o.   MRN: [id number]  Chief Complaint  Patient presents with  . Cough    Started Sunday   . Nasal Congestion  . Chills    Cough This is a new problem. The current episode started in the past 7 days. The problem has been gradually worsening. The problem occurs every few minutes. The cough is productive of purulent sputum. Associated symptoms include chills, headaches, nasal congestion, postnasal drip, rhinorrhea and shortness of breath. Pertinent negatives include no ear congestion, ear pain, fever, myalgias or wheezing. The symptoms are aggravated by lying down. She has tried rest and OTC cough suppressant for the symptoms. The treatment provided mild relief.      Review of Systems  Constitutional: Positive for chills. Negative for fever.  HENT: Positive for postnasal drip and rhinorrhea. Negative for ear pain.   Respiratory: Positive for cough and shortness of breath. Negative for wheezing.   Musculoskeletal: Negative for myalgias.  Neurological: Positive for headaches.  All other systems reviewed and are negative.      Objective:   Physical Exam Vitals reviewed.  Constitutional:      General: She is not in acute distress.    Appearance: She is well-developed.  HENT:     Head: Normocephalic and atraumatic.     Right Ear: Tympanic membrane normal.     Left Ear: Tympanic membrane normal.  Eyes:     Pupils: Pupils are equal, round, and reactive to light.  Neck:     Thyroid: No thyromegaly.  Cardiovascular:     Rate and Rhythm: Normal rate and regular rhythm.     Heart sounds: Normal heart sounds. No murmur heard.   Pulmonary:     Effort: Pulmonary effort is normal. No respiratory distress.     Breath sounds: Normal breath sounds. No wheezing.  Abdominal:     General: Bowel sounds are normal. There is no distension.     Palpations: Abdomen is soft.     Tenderness: There is no abdominal  tenderness.  Musculoskeletal:        General: No tenderness. Normal range of motion.     Cervical back: Normal range of motion and neck supple.  Skin:    General: Skin is warm and dry.  Neurological:     Mental Status: She is alert and oriented to person, place, and time.     Cranial Nerves: No cranial nerve deficit.     Deep Tendon Reflexes: Reflexes are normal and symmetric.  Psychiatric:        Behavior: Behavior normal.        Thought Content: Thought content normal.        Judgment: Judgment normal.      BP (!) 181/87   Pulse 87   Temp (!) 97.1 F (36.2 C) (Temporal)   Wt 165 lb (74.8 kg)   SpO2 97%   BMI 27.46 kg/m       Assessment & Plan:  Stacy Henry comes in today with chief complaint of Cough (Started Sunday ), Nasal Congestion, and Chills   Diagnosis and orders addressed:  1. Cough - Novel Coronavirus, NAA ([facility]) - DG Chest 2 View; Future - dexamethasone (DECADRON) 6 MG tablet; Take 1 tablet (6 mg total) by mouth 2 (two) times daily with a meal for 7 days.  Dispense: 14 tablet; Refill: 0 - albuterol (VENTOLIN HFA) 108 (90 Base) MCG/ACT inhaler;  Inhale 2 puffs into the lungs every 6 (six) hours as needed for wheezing or shortness of breath.  Dispense: 8 g; Refill: 2  2. Suspected COVID-19 virus infection - DG Chest 2 View; Future - dexamethasone (DECADRON) 6 MG tablet; Take 1 tablet (6 mg total) by mouth 2 (two) times daily with a meal for 7 days.  Dispense: 14 tablet; Refill: 0  Rest Force fluids Plain Mucinex  Start dexamethasone  COVID test pending Call if symptoms worsen or do not improve   Evelina Dun, FNP

## 2019-11-26 NOTE — Patient Instructions (Signed)
COVID-19 COVID-19 is a respiratory infection that is caused by a virus called severe acute respiratory syndrome coronavirus 2 (SARS-CoV-2). The disease is also known as coronavirus disease or novel coronavirus. In some people, the virus may not cause any symptoms. In others, it may cause a serious infection. The infection can get worse quickly and can lead to complications, such as:  Pneumonia, or infection of the lungs.  Acute respiratory distress syndrome or ARDS. This is a condition in which fluid build-up in the lungs prevents the lungs from filling with air and passing oxygen into the blood.  Acute respiratory failure. This is a condition in which there is not enough oxygen passing from the lungs to the body or when carbon dioxide is not passing from the lungs out of the body.  Sepsis or septic shock. This is a serious bodily reaction to an infection.  Blood clotting problems.  Secondary infections due to bacteria or fungus.  Organ failure. This is when your body's organs stop working. The virus that causes COVID-19 is contagious. This means that it can spread from person to person through droplets from coughs and sneezes (respiratory secretions). What are the causes? This illness is caused by a virus. You may catch the virus by:  Breathing in droplets from an infected person. Droplets can be spread by a person breathing, speaking, singing, coughing, or sneezing.  Touching something, like a table or a doorknob, that was exposed to the virus (contaminated) and then touching your mouth, nose, or eyes. What increases the risk? Risk for infection You are more likely to be infected with this virus if you:  Are within 6 feet (2 meters) of a person with COVID-19.  Provide care for or live with a person who is infected with COVID-19.  Spend time in crowded indoor spaces or live in shared housing. Risk for serious illness You are more likely to become seriously ill from the virus if you:   Are 50 years of age or older. The higher your age, the more you are at risk for serious illness.  Live in a nursing home or long-term care facility.  Have cancer.  Have a long-term (chronic) disease such as: ? Chronic lung disease, including chronic obstructive pulmonary disease or asthma. ? A long-term disease that lowers your body's ability to fight infection (immunocompromised). ? Heart disease, including heart failure, a condition in which the arteries that lead to the heart become narrow or blocked (coronary artery disease), a disease which makes the heart muscle thick, weak, or stiff (cardiomyopathy). ? Diabetes. ? Chronic kidney disease. ? Sickle cell disease, a condition in which red blood cells have an abnormal "sickle" shape. ? Liver disease.  Are obese. What are the signs or symptoms? Symptoms of this condition can range from mild to severe. Symptoms may appear any time from 2 to 14 days after being exposed to the virus. They include:  A fever or chills.  A cough.  Difficulty breathing.  Headaches, body aches, or muscle aches.  Runny or stuffy (congested) nose.  A sore throat.  New loss of taste or smell. Some people may also have stomach problems, such as nausea, vomiting, or diarrhea. Other people may not have any symptoms of COVID-19. How is this diagnosed? This condition may be diagnosed based on:  Your signs and symptoms, especially if: ? You live in an area with a COVID-19 outbreak. ? You recently traveled to or from an area where the virus is common. ? You   provide care for or live with a person who was diagnosed with COVID-19. ? You were exposed to a person who was diagnosed with COVID-19.  A physical exam.  Lab tests, which may include: ? Taking a sample of fluid from the back of your nose and throat (nasopharyngeal fluid), your nose, or your throat using a swab. ? A sample of mucus from your lungs (sputum). ? Blood tests.  Imaging tests, which  may include, X-rays, CT scan, or ultrasound. How is this treated? At present, there is no medicine to treat COVID-19. Medicines that treat other diseases are being used on a trial basis to see if they are effective against COVID-19. Your health care provider will talk with you about ways to treat your symptoms. For most people, the infection is mild and can be managed at home with rest, fluids, and over-the-counter medicines. Treatment for a serious infection usually takes places in a hospital intensive care unit (ICU). It may include one or more of the following treatments. These treatments are given until your symptoms improve.  Receiving fluids and medicines through an IV.  Supplemental oxygen. Extra oxygen is given through a tube in the nose, a face mask, or a hood.  Positioning you to lie on your stomach (prone position). This makes it easier for oxygen to get into the lungs.  Continuous positive airway pressure (CPAP) or bi-level positive airway pressure (BPAP) machine. This treatment uses mild air pressure to keep the airways open. A tube that is connected to a motor delivers oxygen to the body.  Ventilator. This treatment moves air into and out of the lungs by using a tube that is placed in your windpipe.  Tracheostomy. This is a procedure to create a hole in the neck so that a breathing tube can be inserted.  Extracorporeal membrane oxygenation (ECMO). This procedure gives the lungs a chance to recover by taking over the functions of the heart and lungs. It supplies oxygen to the body and removes carbon dioxide. Follow these instructions at home: Lifestyle  If you are sick, stay home except to get medical care. Your health care provider will tell you how long to stay home. Call your health care provider before you go for medical care.  Rest at home as told by your health care provider.  Do not use any products that contain nicotine or tobacco, such as cigarettes, e-cigarettes, and  chewing tobacco. If you need help quitting, ask your health care provider.  Return to your normal activities as told by your health care provider. Ask your health care provider what activities are safe for you. General instructions  Take over-the-counter and prescription medicines only as told by your health care provider.  Drink enough fluid to keep your urine pale yellow.  Keep all follow-up visits as told by your health care provider. This is important. How is this prevented?  There is no vaccine to help prevent COVID-19 infection. However, there are steps you can take to protect yourself and others from this virus. To protect yourself:   Do not travel to areas where COVID-19 is a risk. The areas where COVID-19 is reported change often. To identify high-risk areas and travel restrictions, check the CDC travel website: wwwnc.cdc.gov/travel/notices  If you live in, or must travel to, an area where COVID-19 is a risk, take precautions to avoid infection. ? Stay away from people who are sick. ? Wash your hands often with soap and water for 20 seconds. If soap and water   are not available, use an alcohol-based hand sanitizer. ? Avoid touching your mouth, face, eyes, or nose. ? Avoid going out in public, follow guidance from your state and local health authorities. ? If you must go out in public, wear a cloth face covering or face mask. Make sure your mask covers your nose and mouth. ? Avoid crowded indoor spaces. Stay at least 6 feet (2 meters) away from others. ? Disinfect objects and surfaces that are frequently touched every day. This may include:  Counters and tables.  Doorknobs and light switches.  Sinks and faucets.  Electronics, such as phones, remote controls, keyboards, computers, and tablets. To protect others: If you have symptoms of COVID-19, take steps to prevent the virus from spreading to others.  If you think you have a COVID-19 infection, contact your health care  provider right away. Tell your health care team that you think you may have a COVID-19 infection.  Stay home. Leave your house only to seek medical care. Do not use public transport.  Do not travel while you are sick.  Wash your hands often with soap and water for 20 seconds. If soap and water are not available, use alcohol-based hand sanitizer.  Stay away from other members of your household. Let healthy household members care for children and pets, if possible. If you have to care for children or pets, wash your hands often and wear a mask. If possible, stay in your own room, separate from others. Use a different bathroom.  Make sure that all people in your household wash their hands well and often.  Cough or sneeze into a tissue or your sleeve or elbow. Do not cough or sneeze into your hand or into the air.  Wear a cloth face covering or face mask. Make sure your mask covers your nose and mouth. Where to find more information  Centers for Disease Control and Prevention: www.cdc.gov/coronavirus/2019-ncov/index.html  World Health Organization: www.who.int/health-topics/coronavirus Contact a health care provider if:  You live in or have traveled to an area where COVID-19 is a risk and you have symptoms of the infection.  You have had contact with someone who has COVID-19 and you have symptoms of the infection. Get help right away if:  You have trouble breathing.  You have pain or pressure in your chest.  You have confusion.  You have bluish lips and fingernails.  You have difficulty waking from sleep.  You have symptoms that get worse. These symptoms may represent a serious problem that is an emergency. Do not wait to see if the symptoms will go away. Get medical help right away. Call your local emergency services (911 in the [place] Do not drive yourself to the hospital. Let the emergency medical personnel know if you think you have COVID-19. Summary  COVID-19 is a  respiratory infection that is caused by a virus. It is also known as coronavirus disease or novel coronavirus. It can cause serious infections, such as pneumonia, acute respiratory distress syndrome, acute respiratory failure, or sepsis.  The virus that causes COVID-19 is contagious. This means that it can spread from person to person through droplets from breathing, speaking, singing, coughing, or sneezing.  You are more likely to develop a serious illness if you are 50 years of age or older, have a weak immune system, live in a nursing home, or have chronic disease.  There is no medicine to treat COVID-19. Your health care provider will talk with you about ways to treat your symptoms.    Take steps to protect yourself and others from infection. Wash your hands often and disinfect objects and surfaces that are frequently touched every day. Stay away from people who are sick and wear a mask if you are sick. This information is not intended to replace advice given to you by your health care provider. Make sure you discuss any questions you have with your health care provider. Document Revised: 10/19/2018 Document Reviewed: 01/25/2018 Elsevier Patient Education  2020 [employer]  

## 2019-11-27 DIAGNOSIS — R059 Cough, unspecified: Secondary | ICD-10-CM | POA: Diagnosis not present

## 2019-11-27 LAB — SARS-COV-2, NAA 2 DAY TAT

## 2019-11-27 LAB — NOVEL CORONAVIRUS, NAA: SARS-CoV-2, NAA: NOT DETECTED

## 2019-12-09 DIAGNOSIS — R300 Dysuria: Secondary | ICD-10-CM | POA: Diagnosis not present

## 2019-12-09 DIAGNOSIS — R0602 Shortness of breath: Secondary | ICD-10-CM | POA: Diagnosis not present

## 2019-12-09 DIAGNOSIS — R031 Nonspecific low blood-pressure reading: Secondary | ICD-10-CM | POA: Diagnosis not present

## 2019-12-09 DIAGNOSIS — N3000 Acute cystitis without hematuria: Secondary | ICD-10-CM | POA: Diagnosis not present

## 2019-12-09 DIAGNOSIS — R059 Cough, unspecified: Secondary | ICD-10-CM | POA: Diagnosis not present

## 2019-12-09 DIAGNOSIS — R000 Tachycardia, unspecified: Secondary | ICD-10-CM | POA: Diagnosis not present

## 2019-12-10 DIAGNOSIS — R7989 Other specified abnormal findings of blood chemistry: Secondary | ICD-10-CM | POA: Diagnosis not present

## 2019-12-10 DIAGNOSIS — J984 Other disorders of lung: Secondary | ICD-10-CM | POA: Diagnosis not present

## 2019-12-10 DIAGNOSIS — R059 Cough, unspecified: Secondary | ICD-10-CM | POA: Diagnosis not present

## 2019-12-10 DIAGNOSIS — R0602 Shortness of breath: Secondary | ICD-10-CM | POA: Diagnosis not present

## 2019-12-10 DIAGNOSIS — R000 Tachycardia, unspecified: Secondary | ICD-10-CM | POA: Diagnosis not present

## 2020-01-01 DIAGNOSIS — Z0000 Encounter for general adult medical examination without abnormal findings: Secondary | ICD-10-CM | POA: Diagnosis not present

## 2020-01-01 DIAGNOSIS — I10 Essential (primary) hypertension: Secondary | ICD-10-CM | POA: Diagnosis not present

## 2020-01-14 ENCOUNTER — Ambulatory Visit: Payer: Medicare Other | Admitting: Family Medicine

## 2020-02-05 DIAGNOSIS — H52202 Unspecified astigmatism, left eye: Secondary | ICD-10-CM | POA: Diagnosis not present

## 2020-02-05 DIAGNOSIS — H25812 Combined forms of age-related cataract, left eye: Secondary | ICD-10-CM | POA: Diagnosis not present

## 2020-02-13 DIAGNOSIS — K219 Gastro-esophageal reflux disease without esophagitis: Secondary | ICD-10-CM | POA: Diagnosis not present

## 2020-02-13 DIAGNOSIS — I129 Hypertensive chronic kidney disease with stage 1 through stage 4 chronic kidney disease, or unspecified chronic kidney disease: Secondary | ICD-10-CM | POA: Diagnosis not present

## 2020-02-13 DIAGNOSIS — N1832 Chronic kidney disease, stage 3b: Secondary | ICD-10-CM | POA: Diagnosis not present

## 2020-02-13 DIAGNOSIS — H02834 Dermatochalasis of left upper eyelid: Secondary | ICD-10-CM | POA: Diagnosis not present

## 2020-02-13 DIAGNOSIS — H11003 Unspecified pterygium of eye, bilateral: Secondary | ICD-10-CM | POA: Diagnosis not present

## 2020-02-13 DIAGNOSIS — Z9841 Cataract extraction status, right eye: Secondary | ICD-10-CM | POA: Diagnosis not present

## 2020-02-13 DIAGNOSIS — H02831 Dermatochalasis of right upper eyelid: Secondary | ICD-10-CM | POA: Diagnosis not present

## 2020-02-13 DIAGNOSIS — H52222 Regular astigmatism, left eye: Secondary | ICD-10-CM | POA: Diagnosis not present

## 2020-02-13 DIAGNOSIS — H25812 Combined forms of age-related cataract, left eye: Secondary | ICD-10-CM | POA: Insufficient documentation

## 2020-02-13 DIAGNOSIS — E785 Hyperlipidemia, unspecified: Secondary | ICD-10-CM | POA: Diagnosis not present

## 2020-02-13 DIAGNOSIS — H43812 Vitreous degeneration, left eye: Secondary | ICD-10-CM | POA: Diagnosis not present

## 2020-02-13 DIAGNOSIS — H527 Unspecified disorder of refraction: Secondary | ICD-10-CM | POA: Diagnosis not present

## 2020-02-14 DIAGNOSIS — Z961 Presence of intraocular lens: Secondary | ICD-10-CM | POA: Diagnosis not present

## 2020-02-26 DIAGNOSIS — M79662 Pain in left lower leg: Secondary | ICD-10-CM | POA: Diagnosis not present

## 2020-03-03 ENCOUNTER — Other Ambulatory Visit: Payer: Self-pay | Admitting: *Deleted

## 2020-03-03 DIAGNOSIS — M79662 Pain in left lower leg: Secondary | ICD-10-CM | POA: Diagnosis not present

## 2020-03-03 DIAGNOSIS — H6593 Unspecified nonsuppurative otitis media, bilateral: Secondary | ICD-10-CM

## 2020-03-03 MED ORDER — FLUTICASONE PROPIONATE 50 MCG/ACT NA SUSP: 2.0000 | Freq: Every day | NASAL | 0 refills | Status: AC

## 2020-03-06 DIAGNOSIS — H527 Unspecified disorder of refraction: Secondary | ICD-10-CM | POA: Diagnosis not present

## 2020-03-06 DIAGNOSIS — H43812 Vitreous degeneration, left eye: Secondary | ICD-10-CM | POA: Diagnosis not present

## 2020-03-06 DIAGNOSIS — H02831 Dermatochalasis of right upper eyelid: Secondary | ICD-10-CM | POA: Diagnosis not present

## 2020-03-06 DIAGNOSIS — H02834 Dermatochalasis of left upper eyelid: Secondary | ICD-10-CM | POA: Diagnosis not present

## 2020-03-06 DIAGNOSIS — Z961 Presence of intraocular lens: Secondary | ICD-10-CM | POA: Diagnosis not present

## 2020-03-06 DIAGNOSIS — H11003 Unspecified pterygium of eye, bilateral: Secondary | ICD-10-CM | POA: Diagnosis not present

## 2020-04-01 ENCOUNTER — Other Ambulatory Visit: Payer: Self-pay | Admitting: Family Medicine

## 2020-04-01 DIAGNOSIS — Z79899 Other long term (current) drug therapy: Secondary | ICD-10-CM

## 2020-04-01 DIAGNOSIS — F5101 Primary insomnia: Secondary | ICD-10-CM

## 2020-07-09 ENCOUNTER — Encounter: Payer: Self-pay | Admitting: Family Medicine

## 2020-07-09 ENCOUNTER — Ambulatory Visit (INDEPENDENT_AMBULATORY_CARE_PROVIDER_SITE_OTHER): Payer: Medicare Other | Admitting: Family Medicine

## 2020-07-09 ENCOUNTER — Other Ambulatory Visit: Payer: Self-pay

## 2020-07-09 VITALS — BP 132/71 | HR 81 | Ht 65.0 in | Wt 162.0 lb

## 2020-07-09 DIAGNOSIS — Z79899 Other long term (current) drug therapy: Secondary | ICD-10-CM

## 2020-07-09 DIAGNOSIS — N1832 Chronic kidney disease, stage 3b: Secondary | ICD-10-CM

## 2020-07-09 DIAGNOSIS — I10 Essential (primary) hypertension: Secondary | ICD-10-CM

## 2020-07-09 DIAGNOSIS — F5101 Primary insomnia: Secondary | ICD-10-CM | POA: Diagnosis not present

## 2020-07-09 DIAGNOSIS — E782 Mixed hyperlipidemia: Secondary | ICD-10-CM

## 2020-07-09 DIAGNOSIS — K219 Gastro-esophageal reflux disease without esophagitis: Secondary | ICD-10-CM | POA: Diagnosis not present

## 2020-07-09 DIAGNOSIS — Z79891 Long term (current) use of opiate analgesic: Secondary | ICD-10-CM | POA: Diagnosis not present

## 2020-07-09 MED ORDER — PANTOPRAZOLE SODIUM 40 MG PO TBEC: 40.0000 mg | DELAYED_RELEASE_TABLET | Freq: Every day | ORAL | 3 refills | Status: DC

## 2020-07-09 MED ORDER — LOSARTAN POTASSIUM 50 MG PO TABS: 25.0000 mg | ORAL_TABLET | Freq: Every day | ORAL | 3 refills | Status: DC

## 2020-07-09 MED ORDER — ZOLPIDEM TARTRATE 5 MG PO TABS: 5.0000 mg | ORAL_TABLET | Freq: Every evening | ORAL | 5 refills | Status: DC | PRN

## 2020-07-09 NOTE — Progress Notes (Signed)
BP 132/71   Pulse 81   Ht 5' 5" (1.651 m)   Wt 162 lb (73.5 kg)   SpO2 95%   BMI 26.96 kg/m   - Subjective:   Patient ID: Stacy Henry, female    DOB: 04-07-1945, 75 y.o.   MRN: [id number]  HPI: Stacy Henry is a 75 y.o. female presenting on 07/09/2020 for Medical Management of Chronic Issues and Insomnia   HPI Hypertension Patient is currently on losartan, and their blood pressure today is 132/71. Patient denies any lightheadedness or dizziness. Patient denies headaches, blurred vision, chest pains, shortness of breath, or weakness. Denies any side effects from medication and is content with current medication.   Hyperlipidemia Patient is coming in for recheck of his hyperlipidemia. The patient is currently taking no medication currently, has tried Livalo before but wants to do blood work to see where she is, she has been off of it for more than 6 months.. They deny any issues with myalgias or history of liver damage from it. They deny any focal numbness or weakness or chest pain.   GERD Patient is currently on pantoprazole.  She denies any major symptoms or abdominal pai blood pressure 132/71 n or belching or burping. She denies any blood in her stool or lightheadedness or dizziness.   Insomnia recheck Current rx-Ambien 5 mg nightly as needed # meds rx-30 Effectiveness of current meds-works well Adverse reactions form meds-none  Pill count performed-No Last drug screen -03/21/2019 ( high risk q34m moderate risk q646mlow risk yearly ) Urine drug screen today- Yes [place]  If yes were their any concerning findings? -None  No flowsheet data found.   Controlled substance contract signed on: Today  Relevant past medical, surgical, family and social history reviewed and updated as indicated. Interim medical history since our last visit reviewed. Allergies and medications reviewed and updated.  Review of Systems  Constitutional:  Negative for  chills and fever.  Eyes:  Negative for visual disturbance.  Respiratory:  Negative for chest tightness and shortness of breath.   Cardiovascular:  Negative for chest pain and leg swelling.  Musculoskeletal:  Negative for back pain and gait problem.  Skin:  Negative for rash.  Neurological:  Negative for dizziness, light-headedness and headaches.  Psychiatric/Behavioral:  Negative for agitation and behavioral problems.   All other systems reviewed and are negative.  Per HPI unless specifically indicated above   Allergies as of 07/09/2020       Reactions   Sulfa Antibiotics         Medication List        Accurate as of July 09, 2020  8:28 AM. If you have any questions, ask your nurse or doctor.          STOP taking these medications    levocetirizine 5 MG tablet Commonly known as: XYZAL Stopped by: JoFransisca Kaufmannettinger, MD   Livalo 2 MG Tabs Generic drug: Pitavastatin Calcium Stopped by: JoFransisca Kaufmannettinger, MD       TAKE these medications    albuterol 108 (90 Base) MCG/ACT inhaler Commonly known as: VENTOLIN HFA Inhale 2 puffs into the lungs every 6 (six) hours as needed for wheezing or shortness of breath.   CALCIUM 1200 PO Take by mouth. One a day   fluticasone 50 MCG/ACT nasal spray Commonly known as: FLONASE Place 2 sprays into both nostrils daily.   losartan 50 MG tablet Commonly known as: COZAAR Take 0.5 tablets (  25 mg total) by mouth daily. Put on hold.  Does not need yet What changed: how much to take Changed by: Worthy Rancher, MD   multivitamin tablet Take 1 tablet by mouth daily.   pantoprazole 40 MG tablet Commonly known as: PROTONIX Take 1 tablet (40 mg total) by mouth daily.   PROBIOTIC PO Take by mouth.   valACYclovir 1000 MG tablet Commonly known as: VALTREX 2gm twice a day for 24h for symptoms   zolpidem 5 MG tablet Commonly known as: AMBIEN Take 1 tablet (5 mg total) by mouth at bedtime as needed for sleep.          Objective:   BP 132/71   Pulse 81   Ht 5' 5" (1.651 m)   Wt 162 lb (73.5 kg)   SpO2 95%   BMI 26.96 kg/m   Wt Readings from Last 3 Encounters:  07/09/20 162 lb (73.5 kg)  11/26/19 165 lb (74.8 kg)  07/12/19 161 lb 6.4 oz (73.2 kg)    Physical Exam Vitals and nursing note reviewed.  Constitutional:      General: She is not in acute distress.    Appearance: She is well-developed. She is not diaphoretic.  Eyes:     Conjunctiva/sclera: Conjunctivae normal.  Cardiovascular:     Rate and Rhythm: Normal rate and regular rhythm.     Heart sounds: Normal heart sounds. No murmur heard. Pulmonary:     Effort: Pulmonary effort is normal. No respiratory distress.     Breath sounds: Normal breath sounds. No wheezing.  Abdominal:     General: Abdomen is flat. Bowel sounds are normal.  Musculoskeletal:        General: No tenderness. Normal range of motion.  Skin:    General: Skin is warm and dry.     Findings: No rash.  Neurological:     Mental Status: She is alert and oriented to person, place, and time.     Coordination: Coordination normal.  Psychiatric:        Behavior: Behavior normal.      Assessment & Plan:   Problem List Items Addressed This Visit       Cardiovascular and Mediastinum   Essential hypertension   Relevant Medications   losartan (COZAAR) 50 MG tablet   Other Relevant Orders   CMP14+EGFR     Digestive   GERD   Relevant Medications   pantoprazole (PROTONIX) 40 MG tablet   Other Relevant Orders   CBC with Differential/Platelet     Genitourinary   Chronic kidney disease (CKD) stage G3b/A1, moderately decreased glomerular filtration rate (GFR) between 30-44 mL/min/1.73 square meter and albuminuria creatinine ratio less than 30 mg/g (HCC)     Other   Hyperlipidemia - Primary   Relevant Medications   losartan (COZAAR) 50 MG tablet   Other Relevant Orders   Lipid panel   Other Visit Diagnoses     Primary insomnia       Relevant  Medications   zolpidem (AMBIEN) 5 MG tablet   Other Relevant Orders   ToxASSURE Select 13 (MW), Urine   Controlled substance agreement signed       Relevant Medications   zolpidem (AMBIEN) 5 MG tablet     Continue current medication, will do drug screen and contract today.  Follow-up in 6 months.  We will do blood work as well today.  Follow up plan: Return in about 6 months (around 01/09/2021), or if symptoms worsen or fail to improve, for  Insomnia and hypertension and CKD.  Counseling provided for all of the vaccine components Orders Placed This Encounter  Procedures   CBC with Differential/Platelet   CMP14+EGFR   Lipid panel   ToxASSURE Select 13 (MW), Urine    Caryl Pina, MD [facility] 07/09/2020, 8:28 AM

## 2020-07-10 LAB — CBC WITH DIFFERENTIAL/PLATELET
Basophils Absolute: 0 10*3/uL (ref 0.0–0.2)
Basos: 0 %
EOS (ABSOLUTE): 0.5 10*3/uL — ABNORMAL HIGH (ref 0.0–0.4)
Eos: 6 %
Hematocrit: 40.7 % (ref 34.0–46.6)
Hemoglobin: 13.6 g/dL (ref 11.1–15.9)
Immature Grans (Abs): 0 10*3/uL (ref 0.0–0.1)
Immature Granulocytes: 0 %
Lymphocytes Absolute: 3.5 10*3/uL — ABNORMAL HIGH (ref 0.7–3.1)
Lymphs: 41 %
MCH: 28.8 pg (ref 26.6–33.0)
MCHC: 33.4 g/dL (ref 31.5–35.7)
MCV: 86 fL (ref 79–97)
Monocytes Absolute: 1 10*3/uL — ABNORMAL HIGH (ref 0.1–0.9)
Monocytes: 12 %
Neutrophils Absolute: 3.5 10*3/uL (ref 1.4–7.0)
Neutrophils: 41 %
Platelets: 276 10*3/uL (ref 150–450)
RBC: 4.73 x10E6/uL (ref 3.77–5.28)
RDW: 12.6 % (ref 11.7–15.4)
WBC: 8.6 10*3/uL (ref 3.4–10.8)

## 2020-07-10 LAB — CMP14+EGFR
ALT: 14 IU/L (ref 0–32)
AST: 20 IU/L (ref 0–40)
Albumin/Globulin Ratio: 1.5 (ref 1.2–2.2)
Albumin: 4.3 g/dL (ref 3.7–4.7)
Alkaline Phosphatase: 76 IU/L (ref 44–121)
BUN/Creatinine Ratio: 19 (ref 12–28)
BUN: 19 mg/dL (ref 8–27)
Bilirubin Total: 0.4 mg/dL (ref 0.0–1.2)
CO2: 23 mmol/L (ref 20–29)
Calcium: 9.1 mg/dL (ref 8.7–10.3)
Chloride: 107 mmol/L — ABNORMAL HIGH (ref 96–106)
Creatinine, Ser: 1.02 mg/dL — ABNORMAL HIGH (ref 0.57–1.00)
Globulin, Total: 2.8 g/dL (ref 1.5–4.5)
Glucose: 93 mg/dL (ref 65–99)
Potassium: 4.6 mmol/L (ref 3.5–5.2)
Sodium: 143 mmol/L (ref 134–144)
Total Protein: 7.1 g/dL (ref 6.0–8.5)
eGFR: 57 mL/min/{1.73_m2} — ABNORMAL LOW (ref >59)

## 2020-07-10 LAB — LIPID PANEL
Chol/HDL Ratio: 4.4 ratio (ref 0.0–4.4)
Cholesterol, Total: 196 mg/dL (ref 100–199)
HDL: 45 mg/dL (ref >39)
LDL Chol Calc (NIH): 123 mg/dL — ABNORMAL HIGH (ref 0–99)
Triglycerides: 159 mg/dL — ABNORMAL HIGH (ref 0–149)
VLDL Cholesterol Cal: 28 mg/dL (ref 5–40)

## 2020-07-15 LAB — TOXASSURE SELECT 13 (MW), URINE

## 2020-07-17 ENCOUNTER — Ambulatory Visit (INDEPENDENT_AMBULATORY_CARE_PROVIDER_SITE_OTHER): Payer: Medicare Other

## 2020-07-17 VITALS — Ht 65.0 in | Wt 162.0 lb

## 2020-07-17 DIAGNOSIS — Z1231 Encounter for screening mammogram for malignant neoplasm of breast: Secondary | ICD-10-CM | POA: Diagnosis not present

## 2020-07-17 DIAGNOSIS — Z0000 Encounter for general adult medical examination without abnormal findings: Secondary | ICD-10-CM

## 2020-07-17 NOTE — Patient Instructions (Signed)
Stacy Henry , Thank you for taking time to come for your Medicare Wellness Visit. I appreciate your ongoing commitment to your health goals. Please review the following plan we discussed and let me know if I can assist you in the future.   Screening recommendations/referrals: Colonoscopy: Done 12/03/2014 - Repeat in 10 years Mammogram: Done 11/28/2017 - Repeat annually (ordered today) Bone Density: Done 03/11/2019 - Repeat every 2 years Recommended yearly ophthalmology/optometry visit for glaucoma screening and checkup Recommended yearly dental visit for hygiene and checkup  Vaccinations: Influenza vaccine: Done 10/07/2019 - Repeat annually  Pneumococcal vaccine: Done 04/21/2009 & 12/10/2014 Tdap vaccine: Done 2011 - Repeat in 10 years - check insurance coverage and get at our office or at pharmacy Shingles vaccine: Shingrix discussed. Please contact your pharmacy for coverage information.     Covid-19:Done 02/07/2019, 03/08/2019 & 01/14/2020  Advanced directives: Please bring a copy of your health care power of attorney and living will to the office to be added to your chart at your convenience.   Conditions/risks identified: Aim for 30 minutes of exercise or brisk walking each day, drink 6-8 glasses of water and eat lots of fruits and vegetables.   Next appointment: Follow up in one year for your annual wellness visit    Preventive Care 65 Years and Older, Female Preventive care refers to lifestyle choices and visits with your health care provider that can promote health and wellness. What does preventive care include? A yearly physical exam. This is also called an annual well check. Dental exams once or twice a year. Routine eye exams. Ask your health care provider how often you should have your eyes checked. Personal lifestyle choices, including: Daily care of your teeth and gums. Regular physical activity. Eating a healthy diet. Avoiding tobacco and drug use. Limiting alcohol  use. Practicing safe sex. Taking low-dose aspirin every day. Taking vitamin and mineral supplements as recommended by your health care provider. What happens during an annual well check? The services and screenings done by your health care provider during your annual well check will depend on your age, overall health, lifestyle risk factors, and family history of disease. Counseling  Your health care provider may ask you questions about your: Alcohol use. Tobacco use. Drug use. Emotional well-being. Home and relationship well-being. Sexual activity. Eating habits. History of falls. Memory and ability to understand (cognition). Work and work Statistician. Reproductive health. Screening  You may have the following tests or measurements: Height, weight, and BMI. Blood pressure. Lipid and cholesterol levels. These may be checked every 5 years, or more frequently if you are over 69 years old. Skin check. Lung cancer screening. You may have this screening every year starting at age 64 if you have a 30-pack-year history of smoking and currently smoke or have quit within the past 15 years. Fecal occult blood test (FOBT) of the stool. You may have this test every year starting at age 1. Flexible sigmoidoscopy or colonoscopy. You may have a sigmoidoscopy every 5 years or a colonoscopy every 10 years starting at age 48. Hepatitis C blood test. Hepatitis B blood test. Sexually transmitted disease (STD) testing. Diabetes screening. This is done by checking your blood sugar (glucose) after you have not eaten for a while (fasting). You may have this done every 1-3 years. Bone density scan. This is done to screen for osteoporosis. You may have this done starting at age 41. Mammogram. This may be done every 1-2 years. Talk to your health care provider about  how often you should have regular mammograms. Talk with your health care provider about your test results, treatment options, and if necessary,  the need for more tests. Vaccines  Your health care provider may recommend certain vaccines, such as: Influenza vaccine. This is recommended every year. Tetanus, diphtheria, and acellular pertussis (Tdap, Td) vaccine. You may need a Td booster every 10 years. Zoster vaccine. You may need this after age 64. Pneumococcal 13-valent conjugate (PCV13) vaccine. One dose is recommended after age 30. Pneumococcal polysaccharide (PPSV23) vaccine. One dose is recommended after age 52. Talk to your health care provider about which screenings and vaccines you need and how often you need them. This information is not intended to replace advice given to you by your health care provider. Make sure you discuss any questions you have with your health care provider. Document Released: 01/16/2015 Document Revised: 09/09/2015 Document Reviewed: 10/21/2014 Elsevier Interactive Patient Education  2017 [place] Prevention in the Home Falls can cause injuries. They can happen to people of all ages. There are many things you can do to make your home safe and to help prevent falls. What can I do on the outside of my home? Regularly fix the edges of walkways and driveways and fix any cracks. Remove anything that might make you trip as you walk through a door, such as a raised step or threshold. Trim any bushes or trees on the path to your home. Use bright outdoor lighting. Clear any walking paths of anything that might make someone trip, such as rocks or tools. Regularly check to see if handrails are loose or broken. Make sure that both sides of any steps have handrails. Any raised decks and porches should have guardrails on the edges. Have any leaves, snow, or ice cleared regularly. Use sand or salt on walking paths during winter. Clean up any spills in your garage right away. This includes oil or grease spills. What can I do in the bathroom? Use night lights. Install grab bars by the toilet and in the  tub and shower. Do not use towel bars as grab bars. Use non-skid mats or decals in the tub or shower. If you need to sit down in the shower, use a plastic, non-slip stool. Keep the floor dry. Clean up any water that spills on the floor as soon as it happens. Remove soap buildup in the tub or shower regularly. Attach bath mats securely with double-sided non-slip rug tape. Do not have throw rugs and other things on the floor that can make you trip. What can I do in the bedroom? Use night lights. Make sure that you have a light by your bed that is easy to reach. Do not use any sheets or blankets that are too big for your bed. They should not hang down onto the floor. Have a firm chair that has side arms. You can use this for support while you get dressed. Do not have throw rugs and other things on the floor that can make you trip. What can I do in the kitchen? Clean up any spills right away. Avoid walking on wet floors. Keep items that you use a lot in easy-to-reach places. If you need to reach something above you, use a strong step stool that has a grab bar. Keep electrical cords out of the way. Do not use floor polish or wax that makes floors slippery. If you must use wax, use non-skid floor wax. Do not have throw rugs and other  things on the floor that can make you trip. What can I do with my stairs? Do not leave any items on the stairs. Make sure that there are handrails on both sides of the stairs and use them. Fix handrails that are broken or loose. Make sure that handrails are as long as the stairways. Check any carpeting to make sure that it is firmly attached to the stairs. Fix any carpet that is loose or worn. Avoid having throw rugs at the top or bottom of the stairs. If you do have throw rugs, attach them to the floor with carpet tape. Make sure that you have a light switch at the top of the stairs and the bottom of the stairs. If you do not have them, ask someone to add them for  you. What else can I do to help prevent falls? Wear shoes that: Do not have high heels. Have rubber bottoms. Are comfortable and fit you well. Are closed at the toe. Do not wear sandals. If you use a stepladder: Make sure that it is fully opened. Do not climb a closed stepladder. Make sure that both sides of the stepladder are locked into place. Ask someone to hold it for you, if possible. Clearly mark and make sure that you can see: Any grab bars or handrails. First and last steps. Where the edge of each step is. Use tools that help you move around (mobility aids) if they are needed. These include: Canes. Walkers. Scooters. Crutches. Turn on the lights when you go into a dark area. Replace any light bulbs as soon as they burn out. Set up your furniture so you have a clear path. Avoid moving your furniture around. If any of your floors are uneven, fix them. If there are any pets around you, be aware of where they are. Review your medicines with your doctor. Some medicines can make you feel dizzy. This can increase your chance of falling. Ask your doctor what other things that you can do to help prevent falls. This information is not intended to replace advice given to you by your health care provider. Make sure you discuss any questions you have with your health care provider. Document Released: 10/16/2008 Document Revised: 05/28/2015 Document Reviewed: 01/24/2014 Elsevier Interactive Patient Education  2017 [employer].   Insomnia Insomnia is a sleep disorder that makes it difficult to fall asleep or stay asleep. Insomnia can cause fatigue, low energy, difficulty concentrating, moodswings, and poor performance at work or school. There are three different ways to classify insomnia: Difficulty falling asleep. Difficulty staying asleep. Waking up too early in the morning. Any type of insomnia can be long-term (chronic) or short-term (acute). Both are common. Short-term insomnia  usually lasts for three months or less. Chronic insomnia occurs at least three times a week for longer than threemonths. What are the causes? Insomnia may be caused by another condition, situation, or substance, such as: Anxiety. Certain medicines. Gastroesophageal reflux disease (GERD) or other gastrointestinal conditions. Asthma or other breathing conditions. Restless legs syndrome, sleep apnea, or other sleep disorders. Chronic pain. Menopause. Stroke. Abuse of alcohol, tobacco, or illegal drugs. Mental health conditions, such as depression. Caffeine. Neurological disorders, such as Alzheimer's disease. An overactive thyroid (hyperthyroidism). Sometimes, the cause of insomnia may not be known. What increases the risk? Risk factors for insomnia include: Gender. Women are affected more often than men. Age. Insomnia is more common as you get older. Stress. Lack of exercise. Irregular work schedule or working night  shifts. Traveling between different time zones. Certain medical and mental health conditions. What are the signs or symptoms? If you have insomnia, the main symptom is having trouble falling asleep or having trouble staying asleep. This may lead to other symptoms, such as: Feeling fatigued or having low energy. Feeling nervous about going to sleep. Not feeling rested in the morning. Having trouble concentrating. Feeling irritable, anxious, or depressed. How is this diagnosed? This condition may be diagnosed based on: Your symptoms and medical history. Your health care provider may ask about: Your sleep habits. Any medical conditions you have. Your mental health. A physical exam. How is this treated? Treatment for insomnia depends on the cause. Treatment may focus on treating an underlying condition that is causing insomnia. Treatment may also include: Medicines to help you sleep. Counseling or therapy. Lifestyle adjustments to help you sleep better. Follow these  instructions at home: Eating and drinking  Limit or avoid alcohol, caffeinated beverages, and cigarettes, especially close to bedtime. These can disrupt your sleep. Do not eat a large meal or eat spicy foods right before bedtime. This can lead to digestive discomfort that can make it hard for you to sleep.  Sleep habits  Keep a sleep diary to help you and your health care provider figure out what could be causing your insomnia. Write down: When you sleep. When you wake up during the night. How well you sleep. How rested you feel the next day. Any side effects of medicines you are taking. What you eat and drink. Make your bedroom a dark, comfortable place where it is easy to fall asleep. Put up shades or blackout curtains to block light from outside. Use a white noise machine to block noise. Keep the temperature cool. Limit screen use before bedtime. This includes: Watching TV. Using your smartphone, tablet, or computer. Stick to a routine that includes going to bed and waking up at the same times every day and night. This can help you fall asleep faster. Consider making a quiet activity, such as reading, part of your nighttime routine. Try to avoid taking naps during the day so that you sleep better at night. Get out of bed if you are still awake after 15 minutes of trying to sleep. Keep the lights down, but try reading or doing a quiet activity. When you feel sleepy, go back to bed.  General instructions Take over-the-counter and prescription medicines only as told by your health care provider. Exercise regularly, as told by your health care provider. Avoid exercise starting several hours before bedtime. Use relaxation techniques to manage stress. Ask your health care provider to suggest some techniques that may work well for you. These may include: Breathing exercises. Routines to release muscle tension. Visualizing peaceful scenes. Make sure that you drive carefully. Avoid driving  if you feel very sleepy. Keep all follow-up visits as told by your health care provider. This is important. Contact a health care provider if: You are tired throughout the day. You have trouble in your daily routine due to sleepiness. You continue to have sleep problems, or your sleep problems get worse. Get help right away if: You have serious thoughts about hurting yourself or someone else. If you ever feel like you may hurt yourself or others, or have thoughts about taking your own life, get help right away. You can go to your nearest emergency department or call: Your local emergency services (911 in the [place] A suicide crisis helpline, such as the National Suicide Prevention  Lifeline at 216-664-6377. This is open 24 hours a day. Summary Insomnia is a sleep disorder that makes it difficult to fall asleep or stay asleep. Insomnia can be long-term (chronic) or short-term (acute). Treatment for insomnia depends on the cause. Treatment may focus on treating an underlying condition that is causing insomnia. Keep a sleep diary to help you and your health care provider figure out what could be causing your insomnia. This information is not intended to replace advice given to you by your health care provider. Make sure you discuss any questions you have with your healthcare provider. Document Revised: 10/31/2019 Document Reviewed: 10/31/2019 Elsevier Patient Education  2022 [employer].

## 2020-07-17 NOTE — Progress Notes (Signed)
Subjective:   Stacy Henry is a 75 y.o. female who presents for Medicare Annual (Subsequent) preventive examination.  Virtual Visit via Telephone Note  I connected with  Stacy Henry on 07/17/20 at 10:30 AM EDT by telephone and verified that I am speaking with the correct person using two identifiers.  Location: Patient: Home Provider: [facility] Persons participating in the virtual visit: patient/Nurse Health Advisor   I discussed the limitations, risks, security and privacy concerns of performing an evaluation and management service by telephone and the availability of in person appointments. The patient expressed understanding and agreed to proceed.  Interactive audio and video telecommunications were attempted between this nurse and patient, however failed, due to patient having technical difficulties OR patient did not have access to video capability.  We continued and completed visit with audio only.  Some vital signs may be absent or patient reported.   Kaysia Willard E Eydan Chianese, LPN   Review of Systems     Cardiac Risk Factors include: advanced age (>51men, >7 women);dyslipidemia;hypertension;sedentary lifestyle     Objective:    Today's Vitals   07/17/20 1029  Weight: 162 lb (73.5 kg)  Height: 5\' 5"  (1.651 m)   Body mass index is 26.96 kg/m.  Advanced Directives 07/17/2020 10/30/2018 10/24/2017 11/01/2016 10/20/2015 12/10/2014 12/03/2014  Does Patient Have a Medical Advance Directive? No No No No No No No  Would patient like information on creating a medical advance directive? No - Patient declined No - Patient declined Yes (MAU/Ambulatory/Procedural Areas - Information given) - Yes - Educational materials given Yes - Educational materials given No - patient declined information    Current Medications (verified) Outpatient Encounter Medications as of 07/17/2020  Medication Sig   albuterol (VENTOLIN HFA) 108 (90 Base) MCG/ACT inhaler Inhale 2 puffs into the lungs  every 6 (six) hours as needed for wheezing or shortness of breath.   Calcium Carbonate-Vit D-Min (CALCIUM 1200 PO) Take by mouth. One a day   fluticasone (FLONASE) 50 MCG/ACT nasal spray Place 2 sprays into both nostrils daily.   losartan (COZAAR) 50 MG tablet Take 0.5 tablets (25 mg total) by mouth daily. Put on hold.  Does not need yet   Melatonin 10 MG SUBL Place under the tongue.   Multiple Vitamin (MULTIVITAMIN) tablet Take 1 tablet by mouth daily.   pantoprazole (PROTONIX) 40 MG tablet Take 1 tablet (40 mg total) by mouth daily.   Probiotic Product (PROBIOTIC PO) Take by mouth.   valACYclovir (VALTREX) 1000 MG tablet 2gm twice a day for 24h for symptoms   zolpidem (AMBIEN) 5 MG tablet Take 1 tablet (5 mg total) by mouth at bedtime as needed for sleep.   No facility-administered encounter medications on file as of 07/17/2020.    Allergies (verified) Sulfa antibiotics   History: Past Medical History:  Diagnosis Date   Allergy    GERD (gastroesophageal reflux disease)    HTN (hypertension)    Hyperlipidemia    Insomnia    Ischemic colitis ([place]) 2011   Osteopenia    Past Surgical History:  Procedure Laterality Date   APPENDECTOMY  as teenager   BREAST SURGERY Bilateral    "lump"removal   COLONOSCOPY  2006   RMR: normal.   COLONOSCOPY  2012   [facility]: most c/w ischemic colitis, no path   COLONOSCOPY N/A 12/03/2014   Procedure: COLONOSCOPY;  Surgeon: Daneil Dolin, MD;  Location: [facility];  Service: Endoscopy;  Laterality: N/A;  0900   EYE  SURGERY Bilateral    FINGER SURGERY     Family History  Problem Relation Age of Onset   Alcohol abuse Sister    Alcoholism Sister    Diabetes Brother    Heart attack Brother 35   Aneurysm Brother    Alcoholism Brother    Diabetes Brother    Cirrhosis Brother    Alcohol abuse Brother    Alcoholism Brother    Hypertension Mother    Atrial fibrillation Mother    Macular degeneration Mother    Heart  attack Father 38   Kidney disease Sister    Atrial fibrillation Sister    Colon cancer Neg Hx    Social History   Socioeconomic History   Marital status: Married    Spouse name: Kyung Rudd   Number of children: 1   Years of education: 13   Highest education level: Some college, no degree  Occupational History   Occupation: Electronics engineer  Tobacco Use   Smoking status: Never   Smokeless tobacco: Never  Vaping Use   Vaping Use: Never used  Substance and Sexual Activity   Alcohol use: Yes    Comment: ocassional wine - less than weekly   Drug use: No   Sexual activity: Yes    Partners: Male    Birth control/protection: Post-menopausal  Other Topics Concern   Not on file  Social History Narrative   Not on file   Social Determinants of Health   Financial Resource Strain: Low Risk    Difficulty of Paying Living Expenses: Not hard at all  Food Insecurity: No Food Insecurity   Worried About Charity fundraiser in the Last Year: Never true   [place] in the Last Year: Never true  Transportation Needs: No Transportation Needs   Lack of Transportation (Medical): No   Lack of Transportation (Non-Medical): No  Physical Activity: Inactive   Days of Exercise per Week: 0 days   Minutes of Exercise per Session: 0 min  Stress: Stress Concern Present   Feeling of Stress : To some extent  Social Connections: Engineer, building services of Communication with Friends and Family: More than three times a week   Frequency of Social Gatherings with Friends and Family: More than three times a week   Attends Religious Services: More than 4 times per year   Active Member of [employer] or Organizations: Yes   Attends Music therapist: More than 4 times per year   Marital Status: Married    Tobacco Counseling Counseling given: Not Answered   Clinical Intake:  Pre-visit preparation completed: Yes  Pain : No/denies pain     BMI - recorded: 26.96 Nutritional  Status: BMI 25 -29 Overweight Nutritional Risks: None Diabetes: No  How often do you need to have someone help you when you read instructions, pamphlets, or other written materials from your doctor or pharmacy?: 1 - Never  Diabetic? No  Interpreter Needed?: No  Information entered by :: Daneya Hartgrove, LPN   Activities of Daily Living In your present state of health, do you have any difficulty performing the following activities: 07/17/2020  Hearing? N  Vision? N  Difficulty concentrating or making decisions? N  Walking or climbing stairs? N  Dressing or bathing? N  Doing errands, shopping? N  Preparing Food and eating ? N  Using the Toilet? N  In the past six months, have you accidently leaked urine? N  Do you have problems with loss of  bowel control? N  Managing your Medications? N  Managing your Finances? N  Housekeeping or managing your Housekeeping? N  Some recent data might be hidden    Patient Care Team: Dettinger, Fransisca Kaufmann, MD as PCP - General (Family Medicine) Burnell Blanks, MD as PCP - Cardiology (Cardiology) Dettinger, Fransisca Kaufmann, MD as PCP - Family Medicine (Family Medicine) Druscilla Brownie, MD as Consulting Physician (Dermatology) Gala Romney Cristopher Estimable, MD as Consulting Physician (Gastroenterology)  Indicate any recent Medical Services you may have received from other than [facility] providers in the past year (date may be approximate).     Assessment:   This is a routine wellness examination for Stacy Henry.  Hearing/Vision screen Hearing Screening - Comments:: Denies hearing difficulties  Vision Screening - Comments:: Had lens replacement surgery - reading glasses prn only - up to date with annual eye exams with Dr August Albino in [place]   Dietary issues and exercise activities discussed: Current Exercise Habits: Home exercise routine, Type of exercise: walking, Time (Minutes): 20, Frequency (Times/Week): 7, Weekly Exercise (Minutes/Week): 140, Intensity:  Mild, Exercise limited by: None identified   Goals Addressed             This Visit's Progress    Exercise 150 minutes per week (moderate activity)   Not on track      Depression Screen [facility] 2/9 Scores 07/17/2020 07/09/2020 07/09/2020 07/12/2019 03/11/2019 10/31/2018 10/30/2018  PHQ - 2 Score 0 0 0 0 0 0 0  PHQ- 9 Score 5 5 - - - - -    Fall Risk Fall Risk  07/17/2020 07/09/2020 07/12/2019 03/11/2019 10/31/2018  Falls in the past year? 0 0 0 0 0  Number falls in past yr: 0 - - - -  Injury with Fall? 0 - - - -  Risk for fall due to : No Fall Risks - - - -  Follow up Falls prevention discussed - - - -  Comment - - - - -    FALL RISK PREVENTION PERTAINING TO THE HOME:  Any stairs in or around the home? Yes  If so, are there any without handrails? Yes  - but brick wall on each side Home free of loose throw rugs in walkways, pet beds, electrical cords, etc? Yes  Adequate lighting in your home to reduce risk of falls? Yes   ASSISTIVE DEVICES UTILIZED TO PREVENT FALLS:  Life alert? No  Use of a cane, walker or w/c? No  Grab bars in the bathroom? Yes  Shower chair or bench in shower? No  Elevated toilet seat or a handicapped toilet? No   TIMED UP AND GO:  Was the test performed? No . Telephonic visit  Cognitive Function: Normal cognitive status assessed by direct observation by this Nurse Health Advisor. No abnormalities found.   MMSE - Mini Mental State Exam 10/24/2017 10/19/2016 10/20/2015 12/10/2014  Orientation to time 5 5 5 5   Orientation to Place 5 5 5 5   Registration 3 3 3 3   Attention/ Calculation 5 3 5 5   Recall 3 3 3 3   Language- name 2 objects 2 2 2 2   Language- repeat 1 1 1 1   Language- follow 3 step command 3 3 3 3   Language- read & follow direction 1 1 1 1   Write a sentence 1 1 1 1   Copy design 1 1 1 1   Total score 30 28 30 30      6CIT Screen 10/30/2018  What Year? 0 points  What month? 0  points  What time? 0 points  Count back from 20 0 points  Months in  reverse 0 points  Repeat phrase 0 points  Total Score 0    Immunizations Immunization History  Administered Date(s) Administered   Fluad Quad(high Dose 65+) 10/31/2018, 10/07/2019   Influenza, High Dose Seasonal PF 10/19/2016, 10/04/2017   Influenza,inj,Quad PF,6+ Mos 10/25/2012, 12/10/2014, 10/20/2015   Moderna Sars-Covid-2 Vaccination 02/07/2019, 03/08/2019, 01/14/2020   Pneumococcal Conjugate-13 12/10/2014   Pneumococcal Polysaccharide-23 04/21/2009   Td 01/03/2009   Zoster, Live 04/21/2009    TDAP status: Due, Education has been provided regarding the importance of this vaccine. Advised may receive this vaccine at local pharmacy or Health Dept. Aware to provide a copy of the vaccination record if obtained from local pharmacy or Health Dept. Verbalized acceptance and understanding.  Flu Vaccine status: Up to date  Pneumococcal vaccine status: Up to date  Covid-19 vaccine status: Completed vaccines  Qualifies for Shingles Vaccine? Yes   Zostavax completed Yes   Shingrix Completed?: No.    Education has been provided regarding the importance of this vaccine. Patient has been advised to call insurance company to determine out of pocket expense if they have not yet received this vaccine. Advised may also receive vaccine at local pharmacy or Health Dept. Verbalized acceptance and understanding.  Screening Tests Health Maintenance  Topic Date Due   Zoster Vaccines- Shingrix (1 of 2) Never done   COVID-19 Vaccine (4 - Booster for Moderna series) 07/25/2020 (Originally 04/13/2020)   TETANUS/TDAP  07/09/2021 (Originally 01/04/2019)   INFLUENZA VACCINE  08/03/2020   DEXA SCAN  03/10/2021   Hepatitis C Screening  Completed   PNA vac Low Risk Adult  Completed   HPV VACCINES  Aged Out    Health Maintenance  Health Maintenance Due  Topic Date Due   Zoster Vaccines- Shingrix (1 of 2) Never done    Colorectal cancer screening: Type of screening: Colonoscopy. Completed 12/03/2014.  Repeat every 10 years  Mammogram status: Ordered 07/2020. Pt provided with contact info and advised to call to schedule appt.   Bone Density status: Completed 03/11/2019. Results reflect: Bone density results: OSTEOPENIA. Repeat every 2 years.  Lung Cancer Screening: (Low Dose CT Chest recommended if Age 63-80 years, 30 pack-year currently smoking OR have quit w/in 15years.) does not qualify.   Additional Screening:  Hepatitis C Screening: does qualify; Completed 05/10/2016  Vision Screening: Recommended annual ophthalmology exams for early detection of glaucoma and other disorders of the eye. Is the patient up to date with their annual eye exam?  Yes  Who is the provider or what is the name of the office in which the patient attends annual eye exams? [facility] If pt is not established with a provider, would they like to be referred to a provider to establish care? No .   Dental Screening: Recommended annual dental exams for proper oral hygiene  Community Resource Referral / Chronic Care Management: CRR required this visit?  No   CCM required this visit?  No      Plan:     I have personally reviewed and noted the following in the patient's chart:   Medical and social history Use of alcohol, tobacco or illicit drugs  Current medications and supplements including opioid prescriptions.  Functional ability and status Nutritional status Physical activity Advanced directives List of other physicians Hospitalizations, surgeries, and ER visits in previous 12 months Vitals Screenings to include cognitive, depression, and falls Referrals and appointments  In addition,  I have reviewed and discussed with patient certain preventive protocols, quality metrics, and best practice recommendations. A written personalized care plan for preventive services as well as general preventive health recommendations were provided to patient.     Sandrea Hammond, LPN   01/04/7508   Nurse Notes:  None

## 2020-07-20 ENCOUNTER — Encounter: Payer: Self-pay | Admitting: Family Medicine

## 2020-07-20 ENCOUNTER — Ambulatory Visit (INDEPENDENT_AMBULATORY_CARE_PROVIDER_SITE_OTHER): Payer: Medicare Other | Admitting: Family Medicine

## 2020-07-20 ENCOUNTER — Other Ambulatory Visit: Payer: Self-pay

## 2020-07-20 ENCOUNTER — Telehealth: Payer: Self-pay | Admitting: Family Medicine

## 2020-07-20 VITALS — BP 163/84 | HR 104 | Temp 97.3°F | Resp 20 | Ht 65.0 in | Wt 164.1 lb

## 2020-07-20 DIAGNOSIS — N3000 Acute cystitis without hematuria: Secondary | ICD-10-CM | POA: Diagnosis not present

## 2020-07-20 DIAGNOSIS — R300 Dysuria: Secondary | ICD-10-CM | POA: Diagnosis not present

## 2020-07-20 DIAGNOSIS — I10 Essential (primary) hypertension: Secondary | ICD-10-CM | POA: Diagnosis not present

## 2020-07-20 LAB — URINALYSIS, COMPLETE
Bilirubin, UA: NEGATIVE
Ketones, UA: NEGATIVE
Nitrite, UA: POSITIVE — AB
Specific Gravity, UA: <1.005 — ABNORMAL LOW (ref 1.005–1.030)
Urobilinogen, Ur: 2 mg/dL — ABNORMAL HIGH (ref 0.2–1.0)
pH, UA: 5.5 (ref 5.0–7.5)

## 2020-07-20 LAB — MICROSCOPIC EXAMINATION

## 2020-07-20 MED ORDER — LOSARTAN POTASSIUM 50 MG PO TABS: 50.0000 mg | ORAL_TABLET | Freq: Every day | ORAL | 2 refills | Status: DC

## 2020-07-20 MED ORDER — OLMESARTAN MEDOXOMIL 5 MG PO TABS: 10.0000 mg | ORAL_TABLET | Freq: Every day | ORAL | 2 refills | Status: DC

## 2020-07-20 MED ORDER — CEPHALEXIN 500 MG PO CAPS: 500.0000 mg | ORAL_CAPSULE | Freq: Two times a day (BID) | ORAL | 0 refills | Status: DC

## 2020-07-20 NOTE — Patient Instructions (Signed)
Urinary Tract Infection, Adult A urinary tract infection (UTI) is an infection of any part of the urinary tract. The urinary tract includes the kidneys, ureters, bladder, and urethra. These organs make, store, and get rid of urine in the body. An upper UTI affects the ureters and kidneys. A lower UTI affects the bladder and urethra. What are the causes? Most urinary tract infections are caused by bacteria in your genital area around your urethra, where urine leaves your body. These bacteria grow and cause inflammation of your urinary tract. What increases the risk? You are more likely to develop this condition if: You have a urinary catheter that stays in place. You are not able to control when you urinate or have a bowel movement (incontinence). You are female and you: Use a spermicide or diaphragm for birth control. Have low estrogen levels. Are pregnant. You have certain genes that increase your risk. You are sexually active. You take antibiotic medicines. You have a condition that causes your flow of urine to slow down, such as: An enlarged prostate, if you are female. Blockage in your urethra. A kidney stone. A nerve condition that affects your bladder control (neurogenic bladder). Not getting enough to drink, or not urinating often. You have certain medical conditions, such as: Diabetes. A weak disease-fighting system (immunesystem). Sickle cell disease. Gout. Spinal cord injury. What are the signs or symptoms? Symptoms of this condition include: Needing to urinate right away (urgency). Frequent urination. This may include small amounts of urine each time you urinate. Pain or burning with urination. Blood in the urine. Urine that smells bad or unusual. Trouble urinating. Cloudy urine. Vaginal discharge, if you are female. Pain in the abdomen or the lower back. You may also have: Vomiting or a decreased appetite. Confusion. Irritability or tiredness. A fever or  chills. Diarrhea. The first symptom in older adults may be confusion. In some cases, they may not have any symptoms until the infection has worsened. How is this diagnosed? This condition is diagnosed based on your medical history and a physical exam. You may also have other tests, including: Urine tests. Blood tests. Tests for STIs (sexually transmitted infections). If you have had more than one UTI, a cystoscopy or imaging studies may be done to determine the cause of the infections. How is this treated? Treatment for this condition includes: Antibiotic medicine. Over-the-counter medicines to treat discomfort. Drinking enough water to stay hydrated. If you have frequent infections or have other conditions such as a kidney stone, you may need to see a health care provider who specializes in the urinary tract (urologist). In rare cases, urinary tract infections can cause sepsis. Sepsis is a life-threatening condition that occurs when the body responds to an infection. Sepsis is treated in the hospital with IV antibiotics, fluids, and other medicines. Follow these instructions at home: Medicines Take over-the-counter and prescription medicines only as told by your health care provider. If you were prescribed an antibiotic medicine, take it as told by your health care provider. Do not stop using the antibiotic even if you start to feel better. General instructions Make sure you: Empty your bladder often and completely. Do not hold urine for long periods of time. Empty your bladder after sex. Wipe from front to back after urinating or having a bowel movement if you are female. Use each tissue only one time when you wipe. Drink enough fluid to keep your urine pale yellow. Keep all follow-up visits. This is important. Contact a health care provider   if: Your symptoms do not get better after 1-2 days. Your symptoms go away and then return. Get help right away if: You have severe pain in your  back or your lower abdomen. You have a fever or chills. You have nausea or vomiting. Summary A urinary tract infection (UTI) is an infection of any part of the urinary tract, which includes the kidneys, ureters, bladder, and urethra. Most urinary tract infections are caused by bacteria in your genital area. Treatment for this condition often includes antibiotic medicines. If you were prescribed an antibiotic medicine, take it as told by your health care provider. Do not stop using the antibiotic even if you start to feel better. Keep all follow-up visits. This is important. This information is not intended to replace advice given to you by your health care provider. Make sure you discuss any questions you have with your health care provider. Document Revised: 08/02/2019 Document Reviewed: 08/02/2019 Elsevier Patient Education  2022 [employer]  

## 2020-07-20 NOTE — Telephone Encounter (Signed)
Ok to order as requested 

## 2020-07-20 NOTE — Telephone Encounter (Signed)
Stu at [facility] aware, requests that we send new script for Losartan 50 mg.

## 2020-07-20 NOTE — Addendum Note (Signed)
Addended by: Michaela Corner on: 07/20/2020 05:10 PM   Modules accepted: Orders

## 2020-07-20 NOTE — Progress Notes (Signed)
Acute Office Visit  Subjective:    Patient ID: Stacy Henry, female    DOB: [birth year], 75 y.o.   MRN: [id number]  Chief Complaint  Patient presents with   Urinary Tract Infection    HPI Patient is in today for UTI symptoms. Reports not feeling well for 3-4 days. Reports dysuria, chills, frequency, and lower abdominal pressure x 1 days. She took AZO today with some relief. There is a history of UTIs.   Stacy Henry reports headaches x 1 week. She noticed this started when she picked up her losartan refill. This last refill was through a new manufacturer. She checked her blood pressure yesterday because she wasn't feeling well and it was elevated in the 160s/80s yesterday. She has felt a little dizzy when she stands. She has felt as little short of breath with exertion. Denies chest pain, edema, visual disturbances, or focal weakness.   Past Medical History:  Diagnosis Date   Allergy    GERD (gastroesophageal reflux disease)    HTN (hypertension)    Hyperlipidemia    Insomnia    Ischemic colitis ([place]) 2011   Osteopenia     Past Surgical History:  Procedure Laterality Date   APPENDECTOMY  as teenager   BREAST SURGERY Bilateral    "lump"removal   COLONOSCOPY  2006   RMR: normal.   COLONOSCOPY  2012   [facility]: most c/w ischemic colitis, no path   COLONOSCOPY N/A 12/03/2014   Procedure: COLONOSCOPY;  Surgeon: Daneil Dolin, MD;  Location: [facility];  Service: Endoscopy;  Laterality: N/A;  0900   EYE SURGERY Bilateral    FINGER SURGERY      Family History  Problem Relation Age of Onset   Alcohol abuse Sister    Alcoholism Sister    Diabetes Brother    Heart attack Brother 40   Aneurysm Brother    Alcoholism Brother    Diabetes Brother    Cirrhosis Brother    Alcohol abuse Brother    Alcoholism Brother    Hypertension Mother    Atrial fibrillation Mother    Macular degeneration Mother    Heart attack Father 69   Kidney disease Sister     Atrial fibrillation Sister    Colon cancer Neg Hx     Social History   Socioeconomic History   Marital status: Married    Spouse name: Stacy Henry   Number of children: 1   Years of education: 13   Highest education level: Some college, no degree  Occupational History   Occupation: Electronics engineer  Tobacco Use   Smoking status: Never   Smokeless tobacco: Never  Vaping Use   Vaping Use: Never used  Substance and Sexual Activity   Alcohol use: Yes    Comment: ocassional wine - less than weekly   Drug use: No   Sexual activity: Yes    Partners: Male    Birth control/protection: Post-menopausal  Other Topics Concern   Not on file  Social History Narrative   Not on file   Social Determinants of Health   Financial Resource Strain: Low Risk    Difficulty of Paying Living Expenses: Not hard at all  Food Insecurity: No Food Insecurity   Worried About Charity fundraiser in the Last Year: Never true   [place] in the Last Year: Never true  Transportation Needs: No Transportation Needs   Lack of Transportation (Medical): No   Lack of Transportation (Non-Medical): No  Physical Activity: Inactive   Days of Exercise per Week: 0 days   Minutes of Exercise per Session: 0 min  Stress: Stress Concern Present   Feeling of Stress : To some extent  Social Connections: Engineer, building services of Communication with Friends and Family: More than three times a week   Frequency of Social Gatherings with Friends and Family: More than three times a week   Attends Religious Services: More than 4 times per year   Active Member of [employer] or Organizations: Yes   Attends Music therapist: More than 4 times per year   Marital Status: Married  Human resources officer Violence: Not At Risk   Fear of Current or Ex-Partner: No   Emotionally Abused: No   Physically Abused: No   Sexually Abused: No    Outpatient Medications Prior to Visit  Medication Sig Dispense Refill    albuterol (VENTOLIN HFA) 108 (90 Base) MCG/ACT inhaler Inhale 2 puffs into the lungs every 6 (six) hours as needed for wheezing or shortness of breath. 8 g 2   Calcium Carbonate-Vit D-Min (CALCIUM 1200 PO) Take by mouth. One a day     fluticasone (FLONASE) 50 MCG/ACT nasal spray Place 2 sprays into both nostrils daily. 16 g 0   losartan (COZAAR) 50 MG tablet Take 0.5 tablets (25 mg total) by mouth daily. Put on hold.  Does not need yet 45 tablet 3   Multiple Vitamin (MULTIVITAMIN) tablet Take 1 tablet by mouth daily.     pantoprazole (PROTONIX) 40 MG tablet Take 1 tablet (40 mg total) by mouth daily. 90 tablet 3   Probiotic Product (PROBIOTIC PO) Take by mouth.     valACYclovir (VALTREX) 1000 MG tablet 2gm twice a day for 24h for symptoms 20 tablet 2   zolpidem (AMBIEN) 5 MG tablet Take 1 tablet (5 mg total) by mouth at bedtime as needed for sleep. 30 tablet 5   Melatonin 10 MG SUBL Place under the tongue. (Patient not taking: Reported on 07/20/2020)     No facility-administered medications prior to visit.    Allergies  Allergen Reactions   Sulfa Antibiotics     Review of Systems As per HPI.     Objective:    Physical Exam Vitals and nursing note reviewed.  Constitutional:      General: She is not in acute distress.    Appearance: She is not ill-appearing, toxic-appearing or diaphoretic.  Eyes:     Extraocular Movements: Extraocular movements intact.     Pupils: Pupils are equal, round, and reactive to light.  Cardiovascular:     Rate and Rhythm: Normal rate and regular rhythm.     Heart sounds: Normal heart sounds. No murmur heard. Pulmonary:     Effort: Pulmonary effort is normal. No respiratory distress.     Breath sounds: Normal breath sounds.  Abdominal:     General: Bowel sounds are normal. There is no distension.     Palpations: Abdomen is soft.     Tenderness: There is abdominal tenderness (suprapubic). There is no right CVA tenderness, left CVA tenderness, guarding or  rebound.  Musculoskeletal:     Right lower leg: No edema.     Left lower leg: No edema.  Neurological:     General: No focal deficit present.     Mental Status: She is alert and oriented to person, place, and time.  Psychiatric:        Mood and Affect: Mood normal.  Behavior: Behavior normal.   Urine dipstick shows positive for protein, positive for glucose, positive for nitrates, positive for leukocytes, and positive for urobilinogen.  Micro exam: 0-5 WBC's per HPF, 0-2 RBC's per HPF, and few+ bacteria.   BP (!) 163/84   Pulse (!) 104   Temp (!) 97.3 F (36.3 C) (Temporal)   Resp 20   Ht '5\' 5"'  (1.651 m)   Wt 164 lb 2 oz (74.4 kg)   SpO2 93%   BMI 27.31 kg/m  Wt Readings from Last 3 Encounters:  07/20/20 164 lb 2 oz (74.4 kg)  07/17/20 162 lb (73.5 kg)  07/09/20 162 lb (73.5 kg)    Health Maintenance Due  Topic Date Due   Zoster Vaccines- Shingrix (1 of 2) Never done    There are no preventive care reminders to display for this patient.   Lab Results  Component Value Date   TSH 1.120 07/12/2019   Lab Results  Component Value Date   WBC 8.6 07/09/2020   HGB 13.6 07/09/2020   HCT 40.7 07/09/2020   MCV 86 07/09/2020   PLT 276 07/09/2020   Lab Results  Component Value Date   NA 143 07/09/2020   K 4.6 07/09/2020   CO2 23 07/09/2020   GLUCOSE 93 07/09/2020   BUN 19 07/09/2020   CREATININE 1.02 (H) 07/09/2020   BILITOT 0.4 07/09/2020   ALKPHOS 76 07/09/2020   AST 20 07/09/2020   ALT 14 07/09/2020   PROT 7.1 07/09/2020   ALBUMIN 4.3 07/09/2020   CALCIUM 9.1 07/09/2020   EGFR 57 (L) 07/09/2020   Lab Results  Component Value Date   CHOL 196 07/09/2020   Lab Results  Component Value Date   HDL 45 07/09/2020   Lab Results  Component Value Date   LDLCALC 123 (H) 07/09/2020   Lab Results  Component Value Date   TRIG 159 (H) 07/09/2020   Lab Results  Component Value Date   CHOLHDL 4.4 07/09/2020   Lab Results  Component Value Date    HGBA1C 5.6% 12/13/2013       Assessment & Plan:   Chikita was seen today for urinary tract infection.  Diagnoses and all orders for this visit:  Acute cystitis without hematuria UA positive for leuks, nitrates. Patient did take AZO today. Culture pending. Keflex as below.  -     Urinalysis -     Urine Culture -     Urinalysis, Complete -     cephALEXin (KEFLEX) 500 MG capsule; Take 1 capsule (500 mg total) by mouth 2 (two) times daily. -     Microscopic Examination  Essential hypertension Not at goal. Concerned at new manufacturer of losartan is not as effective and would like to try a new medication. Try benicar as below. Follow up if symptoms persist or worsen.  -     olmesartan (BENICAR) 5 MG tablet; Take 2 tablets (10 mg total) by mouth daily.  Return to office for new or worsening symptoms, or if symptoms persist.   The patient indicates understanding of these issues and agrees with the plan.  Gwenlyn Perking, FNP

## 2020-07-20 NOTE — Telephone Encounter (Signed)
Pt not wanting to change medications yet, per conversation with Stew at [facility] wondering if can get new order for Losartan 50 mg 1 QD with a different manufacturer and try for a month and see how this goes before changing to the olmesartan Please advise

## 2020-07-23 LAB — URINE CULTURE

## 2020-08-03 ENCOUNTER — Other Ambulatory Visit: Payer: Self-pay

## 2020-08-03 ENCOUNTER — Encounter: Payer: Self-pay | Admitting: Nurse Practitioner

## 2020-08-03 ENCOUNTER — Ambulatory Visit: Payer: Medicare Other | Admitting: Family Medicine

## 2020-08-03 ENCOUNTER — Ambulatory Visit (INDEPENDENT_AMBULATORY_CARE_PROVIDER_SITE_OTHER): Payer: Medicare Other | Admitting: Nurse Practitioner

## 2020-08-03 VITALS — BP 117/70 | HR 83 | Temp 97.1°F | Ht 65.0 in | Wt 163.0 lb

## 2020-08-03 DIAGNOSIS — R300 Dysuria: Secondary | ICD-10-CM | POA: Diagnosis not present

## 2020-08-03 LAB — URINALYSIS, ROUTINE W REFLEX MICROSCOPIC
Bilirubin, UA: NEGATIVE
Glucose, UA: NEGATIVE
Ketones, UA: NEGATIVE
Leukocytes,UA: NEGATIVE
Nitrite, UA: POSITIVE — AB
Protein,UA: NEGATIVE
Specific Gravity, UA: 1.01 (ref 1.005–1.030)
Urobilinogen, Ur: 0.2 mg/dL (ref 0.2–1.0)
pH, UA: 5.5 (ref 5.0–7.5)

## 2020-08-03 LAB — MICROSCOPIC EXAMINATION: Epithelial Cells (non renal): NONE SEEN /hpf (ref 0–10)

## 2020-08-03 MED ORDER — CEPHALEXIN 500 MG PO CAPS: 500.0000 mg | ORAL_CAPSULE | Freq: Two times a day (BID) | ORAL | 0 refills | Status: DC

## 2020-08-03 MED ORDER — CEFTRIAXONE SODIUM 1 G IJ SOLR
1.0000 g | Freq: Once | INTRAMUSCULAR | Status: AC
  Administered 2020-08-03: 1 g via INTRAMUSCULAR

## 2020-08-03 NOTE — Addendum Note (Signed)
Addended by: Ivy Lynn on: 08/03/2020 10:30 AM   Modules accepted: Orders

## 2020-08-03 NOTE — Progress Notes (Signed)
Acute Office Visit  Subjective:    Patient ID: Stacy Henry, female    DOB: [birth year], 75 y.o.   MRN: [id number]  Chief Complaint  Patient presents with   Back Pain   Dysuria    Back Pain This is a recurrent problem. The current episode started 1 to 4 weeks ago. The problem occurs constantly. The problem is unchanged. The quality of the pain is described as aching. Associated symptoms include dysuria.  Dysuria  This is a recurrent problem. The current episode started 1 to 4 weeks ago. The problem has been unchanged. The quality of the pain is described as aching. The pain is moderate. There has been no fever. Associated symptoms include chills, flank pain and nausea. Pertinent negatives include no hematuria. She has tried antibiotics for the symptoms. The treatment provided no relief.    Past Medical History:  Diagnosis Date   Allergy    GERD (gastroesophageal reflux disease)    HTN (hypertension)    Hyperlipidemia    Insomnia    Ischemic colitis ([place]) 2011   Osteopenia     Past Surgical History:  Procedure Laterality Date   APPENDECTOMY  as teenager   BREAST SURGERY Bilateral    "lump"removal   COLONOSCOPY  2006   RMR: normal.   COLONOSCOPY  2012   [facility]: most c/w ischemic colitis, no path   COLONOSCOPY N/A 12/03/2014   Procedure: COLONOSCOPY;  Surgeon: Daneil Dolin, MD;  Location: [facility];  Service: Endoscopy;  Laterality: N/A;  0900   EYE SURGERY Bilateral    FINGER SURGERY      Family History  Problem Relation Age of Onset   Alcohol abuse Sister    Alcoholism Sister    Diabetes Brother    Heart attack Brother 33   Aneurysm Brother    Alcoholism Brother    Diabetes Brother    Cirrhosis Brother    Alcohol abuse Brother    Alcoholism Brother    Hypertension Mother    Atrial fibrillation Mother    Macular degeneration Mother    Heart attack Father 23   Kidney disease Sister    Atrial fibrillation Sister    Colon  cancer Neg Hx     Social History   Socioeconomic History   Marital status: Married    Spouse name: Kyung Rudd   Number of children: 1   Years of education: 13   Highest education level: Some college, no degree  Occupational History   Occupation: Electronics engineer  Tobacco Use   Smoking status: Never   Smokeless tobacco: Never  Vaping Use   Vaping Use: Never used  Substance and Sexual Activity   Alcohol use: Yes    Comment: ocassional wine - less than weekly   Drug use: No   Sexual activity: Yes    Partners: Male    Birth control/protection: Post-menopausal  Other Topics Concern   Not on file  Social History Narrative   Not on file   Social Determinants of Health   Financial Resource Strain: Low Risk    Difficulty of Paying Living Expenses: Not hard at all  Food Insecurity: No Food Insecurity   Worried About Charity fundraiser in the Last Year: Never true   [place] in the Last Year: Never true  Transportation Needs: No Transportation Needs   Lack of Transportation (Medical): No   Lack of Transportation (Non-Medical): No  Physical Activity: Inactive   Days of  Exercise per Week: 0 days   Minutes of Exercise per Session: 0 min  Stress: Stress Concern Present   Feeling of Stress : To some extent  Social Connections: Engineer, building services of Communication with Friends and Family: More than three times a week   Frequency of Social Gatherings with Friends and Family: More than three times a week   Attends Religious Services: More than 4 times per year   Active Member of [employer] or Organizations: Yes   Attends Music therapist: More than 4 times per year   Marital Status: Married  Human resources officer Violence: Not At Risk   Fear of Current or Ex-Partner: No   Emotionally Abused: No   Physically Abused: No   Sexually Abused: No    Outpatient Medications Prior to Visit  Medication Sig Dispense Refill   albuterol (VENTOLIN HFA) 108 (90 Base)  MCG/ACT inhaler Inhale 2 puffs into the lungs every 6 (six) hours as needed for wheezing or shortness of breath. 8 g 2   Calcium Carbonate-Vit D-Min (CALCIUM 1200 PO) Take by mouth. One a day     fluticasone (FLONASE) 50 MCG/ACT nasal spray Place 2 sprays into both nostrils daily. 16 g 0   losartan (COZAAR) 50 MG tablet Take 1 tablet (50 mg total) by mouth daily. 30 tablet 2   Multiple Vitamin (MULTIVITAMIN) tablet Take 1 tablet by mouth daily.     pantoprazole (PROTONIX) 40 MG tablet Take 1 tablet (40 mg total) by mouth daily. 90 tablet 3   Probiotic Product (PROBIOTIC PO) Take by mouth.     valACYclovir (VALTREX) 1000 MG tablet 2gm twice a day for 24h for symptoms 20 tablet 2   zolpidem (AMBIEN) 5 MG tablet Take 1 tablet (5 mg total) by mouth at bedtime as needed for sleep. 30 tablet 5   cephALEXin (KEFLEX) 500 MG capsule Take 1 capsule (500 mg total) by mouth 2 (two) times daily. 14 capsule 0   No facility-administered medications prior to visit.    Allergies  Allergen Reactions   Sulfa Antibiotics     Review of Systems  Constitutional:  Positive for chills.  HENT: Negative.    Respiratory: Negative.    Cardiovascular: Negative.   Gastrointestinal:  Positive for nausea.  Genitourinary:  Positive for dysuria and flank pain. Negative for hematuria.  Musculoskeletal:  Positive for back pain.  Skin:  Negative for rash.  All other systems reviewed and are negative.     Objective:    Physical Exam Vitals and nursing note reviewed.  Constitutional:      Appearance: Normal appearance.  HENT:     Head: Normocephalic.     Nose: Nose normal.  Cardiovascular:     Rate and Rhythm: Normal rate and regular rhythm.     Pulses: Normal pulses.     Heart sounds: Normal heart sounds.  Pulmonary:     Effort: Pulmonary effort is normal.     Breath sounds: Normal breath sounds.  Abdominal:     General: Bowel sounds are normal.     Tenderness: There is right CVA tenderness and left CVA  tenderness.  Musculoskeletal:     Lumbar back: Tenderness present.  Skin:    Findings: No rash.  Neurological:     Mental Status: She is alert and oriented to person, place, and time.  Psychiatric:        Behavior: Behavior normal.    BP 117/70   Pulse 83   Temp Marland Kitchen)  97.1 F (36.2 C) (Temporal)   Ht '5\' 5"'  (1.651 m)   Wt 163 lb (73.9 kg)   SpO2 97%   BMI 27.12 kg/m  Wt Readings from Last 3 Encounters:  08/03/20 163 lb (73.9 kg)  07/20/20 164 lb 2 oz (74.4 kg)  07/17/20 162 lb (73.5 kg)    Health Maintenance Due  Topic Date Due   Zoster Vaccines- Shingrix (1 of 2) Never done   COVID-19 Vaccine (4 - Booster for Moderna series) 04/13/2020   INFLUENZA VACCINE  08/03/2020    There are no preventive care reminders to display for this patient.   Lab Results  Component Value Date   TSH 1.120 07/12/2019   Lab Results  Component Value Date   WBC 8.6 07/09/2020   HGB 13.6 07/09/2020   HCT 40.7 07/09/2020   MCV 86 07/09/2020   PLT 276 07/09/2020   Lab Results  Component Value Date   NA 143 07/09/2020   K 4.6 07/09/2020   CO2 23 07/09/2020   GLUCOSE 93 07/09/2020   BUN 19 07/09/2020   CREATININE 1.02 (H) 07/09/2020   BILITOT 0.4 07/09/2020   ALKPHOS 76 07/09/2020   AST 20 07/09/2020   ALT 14 07/09/2020   PROT 7.1 07/09/2020   ALBUMIN 4.3 07/09/2020   CALCIUM 9.1 07/09/2020   EGFR 57 (L) 07/09/2020   Lab Results  Component Value Date   CHOL 196 07/09/2020   Lab Results  Component Value Date   HDL 45 07/09/2020   Lab Results  Component Value Date   LDLCALC 123 (H) 07/09/2020   Lab Results  Component Value Date   TRIG 159 (H) 07/09/2020   Lab Results  Component Value Date   CHOLHDL 4.4 07/09/2020   Lab Results  Component Value Date   HGBA1C 5.6% 12/13/2013       Assessment & Plan:   Problem List Items Addressed This Visit       Other   Dysuria - Primary    Symptoms of UTI not well managed in the past 3 weeks.  Patient has used  antibiotics and has been positive for UTI in the last 2 weeks.  Today patient is in reporting nausea, chills, pelvic pressure, burning with urination, lower back pain.  Rocephin shot given in clinic.  Urinalysis completed positive for nitrites and blood with few bacteria.  Urine sent to culture.  Keflex 500 mg tablet by mouth twice daily sent to pharmacy Tylenol for pain as needed Education provided to patient with printed handouts given. Patient knows to follow-up with worsening unresolved symptoms.        Relevant Orders   Urinalysis, Routine w reflex microscopic     No orders of the defined types were placed in this encounter.    Ivy Lynn, NP

## 2020-08-03 NOTE — Assessment & Plan Note (Signed)
Symptoms of UTI not well managed in the past 3 weeks.  Patient has used antibiotics and has been positive for UTI in the last 2 weeks.  Today patient is in reporting nausea, chills, pelvic pressure, burning with urination, lower back pain.  Rocephin shot given in clinic.  Urinalysis completed positive for nitrites and blood with few bacteria.  Urine sent to culture.  Keflex 500 mg tablet by mouth twice daily sent to pharmacy Tylenol for pain as needed Education provided to patient with printed handouts given. Patient knows to follow-up with worsening unresolved symptoms.

## 2020-08-03 NOTE — Patient Instructions (Signed)
Dysuria ?Dysuria is pain or discomfort during urination. The pain or discomfort may be felt in the part of the body that drains urine from the bladder (urethra) or in the surrounding tissue of the genitals. The pain may also be felt in the groin area, lower abdomen, or lower back. ?You may have to urinate frequently or have the sudden feeling that you have to urinate (urgency). Dysuria can affect anyone, but it is more common in females. Dysuria can be caused by many different things, including: ?Urinary tract infection. ?Kidney stones or bladder stones. ?Certain STIs (sexually transmitted infections), such as chlamydia. ?Dehydration. ?Inflammation of the tissues of the vagina. ?Use of certain medicines. ?Use of certain soaps or scented products that cause irritation. ?Follow these instructions at home: ?Medicines ?Take over-the-counter and prescription medicines only as told by your health care provider. ?If you were prescribed an antibiotic medicine, take it as told by your health care provider. Do not stop taking the antibiotic even if you start to feel better. ?Eating and drinking ? ?Drink enough fluid to keep your urine pale yellow. ?Avoid caffeinated beverages, tea, and alcohol. These beverages can irritate the bladder and make dysuria worse. In males, alcohol may irritate the prostate. ?General instructions ?Watch your condition for any changes. ?Urinate often. Avoid holding urine for long periods of time. ?If you are female, you should wipe from front to back after urinating or having a bowel movement. Use each piece of toilet paper only once. ?Empty your bladder after sex. ?Keep all follow-up visits. This is important. ?If you had any tests done to find the cause of dysuria, it is up to you to get your test results. Ask your health care provider, or the department that is doing the test, when your results will be ready. ?Contact a health care provider if: ?You have a fever. ?You develop pain in your back or  sides. ?You have nausea or vomiting. ?You have blood in your urine. ?You are not urinating as often as you usually do. ?Get help right away if: ?Your pain is severe and not relieved with medicines. ?You cannot eat or drink without vomiting. ?You are confused. ?You have a rapid heartbeat while resting. ?You have shaking or chills. ?You feel extremely weak. ?Summary ?Dysuria is pain or discomfort while urinating. Many different conditions can lead to dysuria. ?If you have dysuria, you may have to urinate frequently or have the sudden feeling that you have to urinate (urgency). ?Watch your condition for any changes. Keep all follow-up visits. ?Make sure that you urinate often and drink enough fluid to keep your urine pale yellow. ?This information is not intended to replace advice given to you by your health care provider. Make sure you discuss any questions you have with your health care provider. ?Document Revised: 08/02/2019 Document Reviewed: 08/02/2019 ?Elsevier Patient Education ? 2022 Elsevier Inc[employer] ? ?

## 2020-08-03 NOTE — Addendum Note (Signed)
Addended byCarrolyn Leigh on: 08/03/2020 01:09 PM   Modules accepted: Orders

## 2020-08-04 LAB — URINE CULTURE

## 2020-08-31 ENCOUNTER — Other Ambulatory Visit: Payer: Self-pay | Admitting: Family Medicine

## 2020-08-31 DIAGNOSIS — Z1231 Encounter for screening mammogram for malignant neoplasm of breast: Secondary | ICD-10-CM

## 2020-09-22 ENCOUNTER — Ambulatory Visit
Admission: RE | Admit: 2020-09-22 | Discharge: 2020-09-22 | Disposition: A | Discharge status: Home / Self Care | Payer: Medicare Other | Source: Ambulatory Visit | Attending: Family Medicine | Admitting: Family Medicine

## 2020-09-22 ENCOUNTER — Other Ambulatory Visit: Payer: Self-pay

## 2020-09-22 DIAGNOSIS — Z1231 Encounter for screening mammogram for malignant neoplasm of breast: Secondary | ICD-10-CM

## 2020-09-29 DIAGNOSIS — L821 Other seborrheic keratosis: Secondary | ICD-10-CM | POA: Diagnosis not present

## 2020-09-29 DIAGNOSIS — D0462 Carcinoma in situ of skin of left upper limb, including shoulder: Secondary | ICD-10-CM | POA: Diagnosis not present

## 2020-09-29 DIAGNOSIS — C44319 Basal cell carcinoma of skin of other parts of face: Secondary | ICD-10-CM | POA: Diagnosis not present

## 2020-09-29 DIAGNOSIS — D045 Carcinoma in situ of skin of trunk: Secondary | ICD-10-CM | POA: Diagnosis not present

## 2020-10-05 ENCOUNTER — Encounter: Payer: Self-pay | Admitting: Nurse Practitioner

## 2020-10-05 ENCOUNTER — Other Ambulatory Visit: Payer: Self-pay

## 2020-10-05 ENCOUNTER — Ambulatory Visit (INDEPENDENT_AMBULATORY_CARE_PROVIDER_SITE_OTHER): Payer: Medicare Other | Admitting: Nurse Practitioner

## 2020-10-05 VITALS — BP 114/72 | HR 82 | Temp 97.7°F | Resp 20 | Ht 65.0 in | Wt 162.0 lb

## 2020-10-05 DIAGNOSIS — N3000 Acute cystitis without hematuria: Secondary | ICD-10-CM

## 2020-10-05 DIAGNOSIS — R300 Dysuria: Secondary | ICD-10-CM

## 2020-10-05 LAB — URINALYSIS, COMPLETE
Bilirubin, UA: NEGATIVE
Nitrite, UA: POSITIVE — AB
RBC, UA: NEGATIVE
Specific Gravity, UA: 1.02 (ref 1.005–1.030)
Urobilinogen, Ur: 1 mg/dL (ref 0.2–1.0)
pH, UA: 5.5 (ref 5.0–7.5)

## 2020-10-05 LAB — MICROSCOPIC EXAMINATION: Renal Epithel, UA: NONE SEEN /hpf

## 2020-10-05 MED ORDER — DOXYCYCLINE HYCLATE 100 MG PO TABS: 100.0000 mg | ORAL_TABLET | Freq: Two times a day (BID) | ORAL | 0 refills | Status: DC

## 2020-10-05 NOTE — Progress Notes (Signed)
   Subjective:    Patient ID: Banita Lehn, female    DOB: [birth year], 75 y.o.   MRN: [id number]   Chief Complaint: Dysuria   HPI Patient comes in today accompanied by her husband with c/o dysuria. Started 2 day sago with dysuria and progressed to urgency and frequency. Her last UTI was 08/03/20. Her culture at that time grew nothing. In July 2022 urine culture grew ecoli and Klebsiella.    Review of Systems  Constitutional:  Negative for chills, fatigue and fever.  Genitourinary:  Positive for dysuria, frequency, pelvic pain and urgency. Negative for flank pain and hematuria.      Objective:   Physical Exam Vitals and nursing note reviewed.  Constitutional:      Appearance: Normal appearance.  Cardiovascular:     Rate and Rhythm: Normal rate and regular rhythm.     Heart sounds: Normal heart sounds.  Pulmonary:     Breath sounds: Normal breath sounds.  Abdominal:     General: Bowel sounds are normal.     Palpations: Abdomen is soft.     Tenderness: There is abdominal tenderness (mild pelvic tenderness). There is no right CVA tenderness or left CVA tenderness.  Skin:    General: Skin is warm.  Neurological:     General: No focal deficit present.     Mental Status: She is oriented to person, place, and time.  Psychiatric:        Mood and Affect: Mood normal.        Behavior: Behavior normal.   BP 114/72   Pulse 82   Temp 97.7 F (36.5 C) (Temporal)   Resp 20   Ht 5\' 5"  (1.651 m)   Wt 162 lb (73.5 kg)   SpO2 97%   BMI 26.96 kg/m         Assessment & Plan:  [name] in today with chief complaint of Dysuria   1. Dysuria - Urinalysis, Complete  2. Acute cystitis without hematuria Take medication as prescribe Cotton underwear Take shower not bath Cranberry juice, yogurt Force fluids AZO over the counter X2 days Culture pending RTO prn Reveiwed previous office visits and labs to form plan of care  - Urine Culture  Meds ordered this  encounter  Medications   doxycycline (VIBRA-TABS) 100 MG tablet    Sig: Take 1 tablet (100 mg total) by mouth 2 (two) times daily. 1 po bid    Dispense:  20 tablet    Refill:  0    Order Specific Question:   Supervising Provider    Answer:   Caryl Pina A [[id number]]     The above assessment and management plan was discussed with the patient. The patient verbalized understanding of and has agreed to the management plan. Patient is aware to call the clinic if symptoms persist or worsen. Patient is aware when to return to the clinic for a follow-up visit. Patient educated on when it is appropriate to go to the emergency department.   Mary-Margaret Hassell Done, FNP

## 2020-10-05 NOTE — Patient Instructions (Signed)

## 2020-10-09 ENCOUNTER — Other Ambulatory Visit: Payer: Self-pay | Admitting: Family Medicine

## 2020-10-09 DIAGNOSIS — R928 Other abnormal and inconclusive findings on diagnostic imaging of breast: Secondary | ICD-10-CM

## 2020-10-10 LAB — URINE CULTURE

## 2020-10-19 ENCOUNTER — Other Ambulatory Visit: Payer: Self-pay | Admitting: Nurse Practitioner

## 2020-10-19 MED ORDER — AMOXICILLIN-POT CLAVULANATE 875-125 MG PO TABS: 1.0000 | ORAL_TABLET | Freq: Two times a day (BID) | ORAL | 0 refills | Status: DC

## 2020-10-19 NOTE — Addendum Note (Signed)
Addended by: Chevis Pretty on: 10/19/2020 04:55 PM   Modules accepted: Orders

## 2020-10-20 NOTE — Progress Notes (Signed)
Returning nurse call. Please call her back at [phone]

## 2020-10-22 ENCOUNTER — Other Ambulatory Visit: Payer: Self-pay | Admitting: Family Medicine

## 2020-10-23 ENCOUNTER — Telehealth: Payer: Self-pay | Admitting: Family Medicine

## 2020-10-23 NOTE — Telephone Encounter (Signed)
Left message for pt to return call.

## 2020-10-27 ENCOUNTER — Other Ambulatory Visit: Payer: Self-pay

## 2020-10-27 ENCOUNTER — Ambulatory Visit: Payer: Medicare Other

## 2020-10-27 ENCOUNTER — Ambulatory Visit
Admission: RE | Admit: 2020-10-27 | Discharge: 2020-10-27 | Disposition: A | Discharge status: Home / Self Care | Payer: Medicare Other | Source: Ambulatory Visit | Attending: Family Medicine | Admitting: Family Medicine

## 2020-10-27 DIAGNOSIS — R928 Other abnormal and inconclusive findings on diagnostic imaging of breast: Secondary | ICD-10-CM

## 2020-10-27 DIAGNOSIS — R922 Inconclusive mammogram: Secondary | ICD-10-CM | POA: Diagnosis not present

## 2020-10-28 DIAGNOSIS — C44319 Basal cell carcinoma of skin of other parts of face: Secondary | ICD-10-CM | POA: Diagnosis not present

## 2020-11-06 ENCOUNTER — Other Ambulatory Visit: Payer: Self-pay

## 2020-11-06 ENCOUNTER — Ambulatory Visit (INDEPENDENT_AMBULATORY_CARE_PROVIDER_SITE_OTHER): Payer: Medicare Other

## 2020-11-06 DIAGNOSIS — Z23 Encounter for immunization: Secondary | ICD-10-CM | POA: Diagnosis not present

## 2020-11-30 DIAGNOSIS — D0439 Carcinoma in situ of skin of other parts of face: Secondary | ICD-10-CM | POA: Diagnosis not present

## 2020-11-30 DIAGNOSIS — Z85828 Personal history of other malignant neoplasm of skin: Secondary | ICD-10-CM | POA: Diagnosis not present

## 2020-11-30 DIAGNOSIS — L814 Other melanin hyperpigmentation: Secondary | ICD-10-CM | POA: Diagnosis not present

## 2020-11-30 DIAGNOSIS — L820 Inflamed seborrheic keratosis: Secondary | ICD-10-CM | POA: Diagnosis not present

## 2020-11-30 DIAGNOSIS — C44612 Basal cell carcinoma of skin of right upper limb, including shoulder: Secondary | ICD-10-CM | POA: Diagnosis not present

## 2020-11-30 DIAGNOSIS — L570 Actinic keratosis: Secondary | ICD-10-CM | POA: Diagnosis not present

## 2020-11-30 DIAGNOSIS — D2239 Melanocytic nevi of other parts of face: Secondary | ICD-10-CM | POA: Diagnosis not present

## 2020-11-30 DIAGNOSIS — L821 Other seborrheic keratosis: Secondary | ICD-10-CM | POA: Diagnosis not present

## 2021-01-01 ENCOUNTER — Ambulatory Visit (INDEPENDENT_AMBULATORY_CARE_PROVIDER_SITE_OTHER): Payer: Medicare Other | Admitting: Nurse Practitioner

## 2021-01-01 ENCOUNTER — Encounter: Payer: Self-pay | Admitting: Nurse Practitioner

## 2021-01-01 VITALS — BP 127/74 | HR 97 | Temp 98.6°F | Ht 65.0 in | Wt 162.6 lb

## 2021-01-01 DIAGNOSIS — R059 Cough, unspecified: Secondary | ICD-10-CM | POA: Diagnosis not present

## 2021-01-01 DIAGNOSIS — R0981 Nasal congestion: Secondary | ICD-10-CM | POA: Diagnosis not present

## 2021-01-01 DIAGNOSIS — R300 Dysuria: Secondary | ICD-10-CM | POA: Diagnosis not present

## 2021-01-01 LAB — VERITOR FLU A/B WAIVED
Influenza A: NEGATIVE
Influenza B: NEGATIVE

## 2021-01-01 MED ORDER — BENZONATATE 100 MG PO CAPS: 100.0000 mg | ORAL_CAPSULE | Freq: Three times a day (TID) | ORAL | 0 refills | Status: DC | PRN

## 2021-01-01 MED ORDER — AMOXICILLIN-POT CLAVULANATE 875-125 MG PO TABS: 1.0000 | ORAL_TABLET | Freq: Two times a day (BID) | ORAL | 0 refills | Status: DC

## 2021-01-01 NOTE — Assessment & Plan Note (Signed)
Take meds as prescribed - Use a cool mist humidifier  -Use saline nose sprays frequently -Force fluids -Completed/COVID-19 swab results pending. -For fever or aches or pains- take Tylenol or ibuprofen. -Benzonate for cough and congestion -Augmentin 875-125 mg tablet by mouth twice daily. Follow up with worsening unresolved symptoms

## 2021-01-01 NOTE — Patient Instructions (Signed)

## 2021-01-01 NOTE — Assessment & Plan Note (Signed)
Cough symptoms unresolved.  Benzonatate 100 mg tablet by mouth.  Follow-up with worsening unresolved symptoms.  Rx sent to pharmacy.  Education provided to patient printed handouts given.

## 2021-01-01 NOTE — Progress Notes (Signed)
Acute Office Visit  Subjective:    Patient ID: Stacy Henry, female    DOB: June 14, 1945, 75 y.o.   MRN: [id number]  Chief Complaint  Patient presents with   Fever   Nasal Congestion    Cough This is a new problem. Episode onset: In the past 3 days. The problem has been unchanged. The problem occurs constantly. The cough is Productive of sputum. Associated symptoms include a fever, headaches and nasal congestion. Pertinent negatives include no chest pain, ear congestion, ear pain or sore throat. Nothing aggravates the symptoms.  Sinusitis This is a new problem. Episode onset: In the past 3 to 4 days. The problem has been gradually worsening since onset. The maximum temperature recorded prior to her arrival was 100.4 - 100.9 F. Her pain is at a severity of 5/10. The pain is moderate. Associated symptoms include congestion, coughing, headaches and sinus pressure. Pertinent negatives include no ear pain or sore throat. Past treatments include acetaminophen. The treatment provided mild relief.    Past Medical History:  Diagnosis Date   Allergy    GERD (gastroesophageal reflux disease)    HTN (hypertension)    Hyperlipidemia    Insomnia    Ischemic colitis ([place]) 2011   Osteopenia     Past Surgical History:  Procedure Laterality Date   APPENDECTOMY  as teenager   BREAST BIOPSY Bilateral    years ago benign   BREAST EXCISIONAL BIOPSY Right    years ago benign   BREAST SURGERY Bilateral    "lump"removal   COLONOSCOPY  2006   RMR: normal.   COLONOSCOPY  2012   [facility]: most c/w ischemic colitis, no path   COLONOSCOPY N/A 12/03/2014   Procedure: COLONOSCOPY;  Surgeon: Daneil Dolin, MD;  Location: [facility];  Service: Endoscopy;  Laterality: N/A;  0900   EYE SURGERY Bilateral    FINGER SURGERY      Family History  Problem Relation Age of Onset   Alcohol abuse Sister    Alcoholism Sister    Diabetes Brother    Heart attack Brother 14    Aneurysm Brother    Alcoholism Brother    Diabetes Brother    Cirrhosis Brother    Alcohol abuse Brother    Alcoholism Brother    Hypertension Mother    Atrial fibrillation Mother    Macular degeneration Mother    Heart attack Father 36   Kidney disease Sister    Atrial fibrillation Sister    Colon cancer Neg Hx     Social History   Socioeconomic History   Marital status: Married    Spouse name: Kyung Rudd   Number of children: 1   Years of education: 13   Highest education level: Some college, no degree  Occupational History   Occupation: Electronics engineer  Tobacco Use   Smoking status: Never   Smokeless tobacco: Never  Vaping Use   Vaping Use: Never used  Substance and Sexual Activity   Alcohol use: Yes    Comment: ocassional wine - less than weekly   Drug use: No   Sexual activity: Yes    Partners: Male    Birth control/protection: Post-menopausal  Other Topics Concern   Not on file  Social History Narrative   Not on file   Social Determinants of Health   Financial Resource Strain: Low Risk    Difficulty of Paying Living Expenses: Not hard at all  Food Insecurity: No Food Insecurity   Worried  About Running Out of Food in the Last Year: Never true   Ran Out of Food in the Last Year: Never true  Transportation Needs: No Transportation Needs   Lack of Transportation (Medical): No   Lack of Transportation (Non-Medical): No  Physical Activity: Inactive   Days of Exercise per Week: 0 days   Minutes of Exercise per Session: 0 min  Stress: Stress Concern Present   Feeling of Stress : To some extent  Social Connections: Engineer, building services of Communication with Friends and Family: More than three times a week   Frequency of Social Gatherings with Friends and Family: More than three times a week   Attends Religious Services: More than 4 times per year   Active Member of [employer] or Organizations: Yes   Attends Music therapist: More than 4  times per year   Marital Status: Married  Human resources officer Violence: Not At Risk   Fear of Current or Ex-Partner: No   Emotionally Abused: No   Physically Abused: No   Sexually Abused: No    Outpatient Medications Prior to Visit  Medication Sig Dispense Refill   albuterol (VENTOLIN HFA) 108 (90 Base) MCG/ACT inhaler Inhale 2 puffs into the lungs every 6 (six) hours as needed for wheezing or shortness of breath. 8 g 2   Calcium Carbonate-Vit D-Min (CALCIUM 1200 PO) Take by mouth. One a day     COLLAGEN PO Take by mouth.     fluticasone (FLONASE) 50 MCG/ACT nasal spray Place 2 sprays into both nostrils daily. 16 g 0   losartan (COZAAR) 50 MG tablet TAKE ONE TABLET ONCE DAILY 30 tablet 2   Multiple Vitamin (MULTIVITAMIN) tablet Take 1 tablet by mouth daily.     pantoprazole (PROTONIX) 40 MG tablet Take 1 tablet (40 mg total) by mouth daily. 90 tablet 3   Probiotic Product (PROBIOTIC PO) Take by mouth.     valACYclovir (VALTREX) 1000 MG tablet 2gm twice a day for 24h for symptoms 20 tablet 2   zolpidem (AMBIEN) 5 MG tablet Take 1 tablet (5 mg total) by mouth at bedtime as needed for sleep. 30 tablet 5   amoxicillin-clavulanate (AUGMENTIN) 875-125 MG tablet Take 1 tablet by mouth 2 (two) times daily. 14 tablet 0   doxycycline (VIBRA-TABS) 100 MG tablet Take 1 tablet (100 mg total) by mouth 2 (two) times daily. 1 po bid 20 tablet 0   No facility-administered medications prior to visit.    Allergies  Allergen Reactions   Sulfa Antibiotics     Review of Systems  Constitutional:  Positive for fever.  HENT:  Positive for congestion and sinus pressure. Negative for ear pain and sore throat.   Respiratory:  Positive for cough.   Cardiovascular:  Negative for chest pain.  Neurological:  Positive for headaches.  All other systems reviewed and are negative.     Objective:    Physical Exam Vitals and nursing note reviewed. Exam conducted with a chaperone present (Spouse).   Constitutional:      Appearance: Normal appearance.  HENT:     Head: Normocephalic.     Right Ear: External ear normal.     Left Ear: External ear normal.     Nose: Congestion present.  Eyes:     Pupils: Pupils are equal, round, and reactive to light.  Cardiovascular:     Rate and Rhythm: Regular rhythm.     Pulses: Normal pulses.     Heart sounds: Normal heart  sounds.  Pulmonary:     Effort: Pulmonary effort is normal.     Breath sounds: Normal breath sounds.  Abdominal:     General: Bowel sounds are normal.  Skin:    General: Skin is warm.     Findings: No rash.  Neurological:     Mental Status: She is alert and oriented to person, place, and time.    BP 127/74    Pulse 97    Temp 98.6 F (37 C)    Ht '5\' 5"'  (1.651 m)    Wt 162 lb 9.6 oz (73.8 kg)    SpO2 93%    BMI 27.06 kg/m  Wt Readings from Last 3 Encounters:  01/01/21 162 lb 9.6 oz (73.8 kg)  10/05/20 162 lb (73.5 kg)  08/03/20 163 lb (73.9 kg)    Health Maintenance Due  Topic Date Due   Zoster Vaccines- Shingrix (1 of 2) Never done   COVID-19 Vaccine (4 - Booster for Moderna series) 03/10/2020    There are no preventive care reminders to display for this patient.   Lab Results  Component Value Date   TSH 1.120 07/12/2019   Lab Results  Component Value Date   WBC 8.6 07/09/2020   HGB 13.6 07/09/2020   HCT 40.7 07/09/2020   MCV 86 07/09/2020   PLT 276 07/09/2020   Lab Results  Component Value Date   NA 143 07/09/2020   K 4.6 07/09/2020   CO2 23 07/09/2020   GLUCOSE 93 07/09/2020   BUN 19 07/09/2020   CREATININE 1.02 (H) 07/09/2020   BILITOT 0.4 07/09/2020   ALKPHOS 76 07/09/2020   AST 20 07/09/2020   ALT 14 07/09/2020   PROT 7.1 07/09/2020   ALBUMIN 4.3 07/09/2020   CALCIUM 9.1 07/09/2020   EGFR 57 (L) 07/09/2020   Lab Results  Component Value Date   CHOL 196 07/09/2020   Lab Results  Component Value Date   HDL 45 07/09/2020   Lab Results  Component Value Date   LDLCALC 123 (H)  07/09/2020   Lab Results  Component Value Date   TRIG 159 (H) 07/09/2020   Lab Results  Component Value Date   CHOLHDL 4.4 07/09/2020   Lab Results  Component Value Date   HGBA1C 5.6% 12/13/2013       Assessment & Plan:   Problem List Items Addressed This Visit       Respiratory   Sinus congestion    Take meds as prescribed - Use a cool mist humidifier  -Use saline nose sprays frequently -Force fluids -Completed/COVID-19 swab results pending. -For fever or aches or pains- take Tylenol or ibuprofen. -Benzonate for cough and congestion -Augmentin 875-125 mg tablet by mouth twice daily. Follow up with worsening unresolved symptoms      Relevant Medications   amoxicillin-clavulanate (AUGMENTIN) 875-125 MG tablet     Other   Dysuria   Cough - Primary    Cough symptoms unresolved.  Benzonatate 100 mg tablet by mouth.  Follow-up with worsening unresolved symptoms.  Rx sent to pharmacy.  Education provided to patient printed handouts given.      Relevant Medications   benzonatate (TESSALON PERLES) 100 MG capsule   Other Relevant Orders   Veritor Flu A/B Waived (Completed)   Novel Coronavirus, NAA ([facility])     Meds ordered this encounter  Medications   amoxicillin-clavulanate (AUGMENTIN) 875-125 MG tablet    Sig: Take 1 tablet by mouth 2 (two) times daily.    Dispense:  14 tablet    Refill:  0    Order Specific Question:   Supervising Provider    Answer:   Jeneen Rinks   benzonatate (TESSALON PERLES) 100 MG capsule    Sig: Take 1 capsule (100 mg total) by mouth 3 (three) times daily as needed for cough.    Dispense:  20 capsule    Refill:  0    Order Specific Question:   Supervising Provider    Answer:   Claretta Fraise [[id number]]     Ivy Lynn, NP

## 2021-01-02 LAB — SARS-COV-2, NAA 2 DAY TAT

## 2021-01-02 LAB — NOVEL CORONAVIRUS, NAA: SARS-CoV-2, NAA: DETECTED — AB

## 2021-01-08 ENCOUNTER — Other Ambulatory Visit: Payer: Self-pay | Admitting: Family Medicine

## 2021-01-08 DIAGNOSIS — F5101 Primary insomnia: Secondary | ICD-10-CM

## 2021-01-08 DIAGNOSIS — Z79899 Other long term (current) drug therapy: Secondary | ICD-10-CM

## 2021-01-11 ENCOUNTER — Encounter: Payer: Self-pay | Admitting: Family Medicine

## 2021-01-11 ENCOUNTER — Ambulatory Visit (INDEPENDENT_AMBULATORY_CARE_PROVIDER_SITE_OTHER): Payer: Medicare Other | Admitting: Family Medicine

## 2021-01-11 VITALS — BP 139/79 | HR 83 | Ht 65.0 in | Wt 162.0 lb

## 2021-01-11 DIAGNOSIS — I10 Essential (primary) hypertension: Secondary | ICD-10-CM

## 2021-01-11 DIAGNOSIS — N1832 Chronic kidney disease, stage 3b: Secondary | ICD-10-CM | POA: Diagnosis not present

## 2021-01-11 DIAGNOSIS — K219 Gastro-esophageal reflux disease without esophagitis: Secondary | ICD-10-CM | POA: Diagnosis not present

## 2021-01-11 DIAGNOSIS — I129 Hypertensive chronic kidney disease with stage 1 through stage 4 chronic kidney disease, or unspecified chronic kidney disease: Secondary | ICD-10-CM | POA: Diagnosis not present

## 2021-01-11 DIAGNOSIS — F5101 Primary insomnia: Secondary | ICD-10-CM

## 2021-01-11 DIAGNOSIS — Z79899 Other long term (current) drug therapy: Secondary | ICD-10-CM

## 2021-01-11 DIAGNOSIS — E782 Mixed hyperlipidemia: Secondary | ICD-10-CM

## 2021-01-11 LAB — CMP14+EGFR
ALT: 22 IU/L (ref 0–32)
AST: 23 IU/L (ref 0–40)
Albumin/Globulin Ratio: 1.9 (ref 1.2–2.2)
Albumin: 4.3 g/dL (ref 3.7–4.7)
Alkaline Phosphatase: 92 IU/L (ref 44–121)
BUN/Creatinine Ratio: 16 (ref 12–28)
BUN: 17 mg/dL (ref 8–27)
Bilirubin Total: 0.3 mg/dL (ref 0.0–1.2)
CO2: 23 mmol/L (ref 20–29)
Calcium: 9.4 mg/dL (ref 8.7–10.3)
Chloride: 108 mmol/L — ABNORMAL HIGH (ref 96–106)
Creatinine, Ser: 1.09 mg/dL — ABNORMAL HIGH (ref 0.57–1.00)
Globulin, Total: 2.3 g/dL (ref 1.5–4.5)
Glucose: 93 mg/dL (ref 70–99)
Potassium: 4.3 mmol/L (ref 3.5–5.2)
Sodium: 145 mmol/L — ABNORMAL HIGH (ref 134–144)
Total Protein: 6.6 g/dL (ref 6.0–8.5)
eGFR: 53 mL/min/{1.73_m2} — ABNORMAL LOW (ref >59)

## 2021-01-11 LAB — CBC WITH DIFFERENTIAL/PLATELET
Basophils Absolute: 0 10*3/uL (ref 0.0–0.2)
Basos: 1 %
EOS (ABSOLUTE): 0.3 10*3/uL (ref 0.0–0.4)
Eos: 3 %
Hematocrit: 39.9 % (ref 34.0–46.6)
Hemoglobin: 13.4 g/dL (ref 11.1–15.9)
Immature Grans (Abs): 0 10*3/uL (ref 0.0–0.1)
Immature Granulocytes: 0 %
Lymphocytes Absolute: 3.3 10*3/uL — ABNORMAL HIGH (ref 0.7–3.1)
Lymphs: 38 %
MCH: 28.5 pg (ref 26.6–33.0)
MCHC: 33.6 g/dL (ref 31.5–35.7)
MCV: 85 fL (ref 79–97)
Monocytes Absolute: 1.1 10*3/uL — ABNORMAL HIGH (ref 0.1–0.9)
Monocytes: 12 %
Neutrophils Absolute: 4.1 10*3/uL (ref 1.4–7.0)
Neutrophils: 46 %
Platelets: 280 10*3/uL (ref 150–450)
RBC: 4.7 x10E6/uL (ref 3.77–5.28)
RDW: 13.2 % (ref 11.7–15.4)
WBC: 8.8 10*3/uL (ref 3.4–10.8)

## 2021-01-11 LAB — LIPID PANEL
Chol/HDL Ratio: 4.6 ratio — ABNORMAL HIGH (ref 0.0–4.4)
Cholesterol, Total: 176 mg/dL (ref 100–199)
HDL: 38 mg/dL — ABNORMAL LOW (ref >39)
LDL Chol Calc (NIH): 101 mg/dL — ABNORMAL HIGH (ref 0–99)
Triglycerides: 214 mg/dL — ABNORMAL HIGH (ref 0–149)
VLDL Cholesterol Cal: 37 mg/dL (ref 5–40)

## 2021-01-11 MED ORDER — QUETIAPINE FUMARATE 50 MG PO TABS: 50.0000 mg | ORAL_TABLET | Freq: Every day | ORAL | 2 refills | Status: DC

## 2021-01-11 MED ORDER — PANTOPRAZOLE SODIUM 40 MG PO TBEC: 40.0000 mg | DELAYED_RELEASE_TABLET | Freq: Every day | ORAL | 3 refills | Status: DC

## 2021-01-11 MED ORDER — LOSARTAN POTASSIUM 50 MG PO TABS: 50.0000 mg | ORAL_TABLET | Freq: Every day | ORAL | 3 refills | Status: DC

## 2021-01-11 NOTE — Progress Notes (Signed)
Ht '5\' 5"'  (1.651 m)    BMI 27.06 kg/m    Subjective:   Patient ID: Stacy Henry, female    DOB: 16-Mar-1945, 76 y.o.   MRN: [id number]  HPI: Stacy Henry is a 76 y.o. female presenting on 01/11/2021 for Medical Management of Chronic Issues and Hyperlipidemia   HPI Insomnia recheck Patient is coming in today for insomnia recheck.  She has no drug screen for Xanax that she took from her sister because she was out of Ambien.  Encourage that we are going to try something different anyways because she is not working for her.  She has trouble falling asleep and takes her 3 to 4 hours to fall asleep.  Discussed sleep hygiene and improving bedtime routines.  We will switch with Seroquel.  CKD 3 recheck We are going to recheck her kidneys today.  Hypertension Patient is currently on losartan, and their blood pressure today is 139/79. Patient denies any lightheadedness or dizziness. Patient denies headaches, blurred vision, chest pains, shortness of breath, or weakness. Denies any side effects from medication and is content with current medication.   Hyperlipidemia Patient is coming in for recheck of his hyperlipidemia. The patient is currently taking no medication currently, has been trying diet.  We will recheck blood work today  GERD Patient is currently on pantoprazole.  She denies any major symptoms or abdominal pain or belching or burping.  Relevant past medical, surgical, family and social history reviewed and updated as indicated. Interim medical history since our last visit reviewed. Allergies and medications reviewed and updated.  Review of Systems  Constitutional:  Negative for chills and fever.  Eyes:  Negative for visual disturbance.  Respiratory:  Negative for chest tightness and shortness of breath.   Cardiovascular:  Negative for chest pain and leg swelling.  Musculoskeletal:  Negative for back pain and gait problem.  Skin:  Negative for rash.  Neurological:   Negative for light-headedness and headaches.  Psychiatric/Behavioral:  Positive for sleep disturbance. Negative for agitation, behavioral problems, dysphoric mood, self-injury and suicidal ideas. The patient is nervous/anxious.   All other systems reviewed and are negative.  Per HPI unless specifically indicated above   Allergies as of 01/11/2021       Reactions   Sulfa Antibiotics         Medication List        Accurate as of January 11, 2021  8:50 AM.  If you have any questions, ask your nurse or doctor.          STOP taking these medications    albuterol 108 (90 Base) MCG/ACT inhaler Commonly known as: VENTOLIN HFA Stopped by: Fransisca Kaufmann Lyann Hagstrom, MD   amoxicillin-clavulanate 875-125 MG tablet Commonly known as: AUGMENTIN Stopped by: Fransisca Kaufmann Tara Wich, MD   benzonatate 100 MG capsule Commonly known as: Best boy Stopped by: Worthy Rancher, MD   zolpidem 5 MG tablet Commonly known as: AMBIEN Stopped by: Fransisca Kaufmann Chikita Dogan, MD       TAKE these medications    CALCIUM 1200 PO Take by mouth. One a day   COLLAGEN PO Take by mouth.   fluticasone 50 MCG/ACT nasal spray Commonly known as: FLONASE Place 2 sprays into both nostrils daily.   losartan 50 MG tablet Commonly known as: COZAAR Take 1 tablet (50 mg total) by mouth daily.   multivitamin tablet Take 1 tablet by mouth daily.   pantoprazole 40 MG tablet Commonly known as: PROTONIX Take 1 tablet (40  mg total) by mouth daily.   PROBIOTIC PO Take by mouth.   QUEtiapine 50 MG tablet Commonly known as: SEROquel Take 1 tablet (50 mg total) by mouth at bedtime. Started by: Worthy Rancher, MD   valACYclovir 1000 MG tablet Commonly known as: VALTREX 2gm twice a day for 24h for symptoms         Objective:   Ht '5\' 5"'  (1.651 m)    BMI 27.06 kg/m   Wt Readings from Last 3 Encounters:  01/01/21 162 lb 9.6 oz (73.8 kg)  10/05/20 162 lb (73.5 kg)  08/03/20 163 lb (73.9 kg)     Physical Exam Vitals and nursing note reviewed.  Constitutional:      General: She is not in acute distress.    Appearance: She is well-developed. She is not diaphoretic.  Eyes:     Conjunctiva/sclera: Conjunctivae normal.  Cardiovascular:     Rate and Rhythm: Normal rate and regular rhythm.     Heart sounds: Normal heart sounds. No murmur heard. Pulmonary:     Effort: Pulmonary effort is normal. No respiratory distress.     Breath sounds: Normal breath sounds. No wheezing.  Musculoskeletal:        General: No tenderness.  Skin:    General: Skin is warm and dry.     Findings: No rash.  Neurological:     Mental Status: She is alert and oriented to person, place, and time.     Coordination: Coordination normal.  Psychiatric:        Mood and Affect: Mood is anxious. Mood is not depressed.        Behavior: Behavior normal.        Thought Content: Thought content does not include suicidal ideation. Thought content does not include suicidal plan.      Assessment & Plan:   Problem List Items Addressed This Visit       Cardiovascular and Mediastinum   Essential hypertension - Primary   Relevant Medications   losartan (COZAAR) 50 MG tablet   Other Relevant Orders   CBC with Differential/Platelet     Digestive   GERD   Relevant Medications   pantoprazole (PROTONIX) 40 MG tablet   Other Relevant Orders   CBC with Differential/Platelet     Genitourinary   Chronic kidney disease (CKD) stage G3b/A1, moderately decreased glomerular filtration rate (GFR) between 30-44 mL/min/1.73 square meter and albuminuria creatinine ratio less than 30 mg/g (HCC)   Relevant Orders   CBC with Differential/Platelet   CMP14+EGFR   Chronic kidney disease due to hypertension     Other   Hyperlipidemia   Relevant Medications   losartan (COZAAR) 50 MG tablet   Other Relevant Orders   Lipid panel   Other Visit Diagnoses     Primary insomnia       Relevant Medications   QUEtiapine  (SEROQUEL) 50 MG tablet   Controlled substance agreement signed           Patient failed drug screen with benzodiazepines, apparently taken from her sister.  She had been out of the Ambien.  Recommended to try something different anyways because she says the Ambien is not working for her, will try low-dose Seroquel and work-up from there. Follow up plan: Return if symptoms worsen or fail to improve, for 1 to 10-monthrecheck for insomnia.  Counseling provided for all of the vaccine components Orders Placed This Encounter  Procedures   CBC with Differential/Platelet   CMP14+EGFR  Lipid panel    Caryl Pina, MD [facility] 01/11/2021, 8:50 AM

## 2021-01-18 ENCOUNTER — Telehealth: Payer: Self-pay

## 2021-01-18 NOTE — Telephone Encounter (Signed)
Spoke to patient she states that she has only been taking 1/2 tab and started taking earlier and effects are still lasting into the next day - she states that she feels like a zombie, not fully awake and alert until 2pm or later the next day.

## 2021-01-18 NOTE — Telephone Encounter (Signed)
Have her cut the medicine in half and take it that way for a week and let me know how it does

## 2021-01-18 NOTE — Telephone Encounter (Signed)
New medication prescribed for sleep made pt too drowsy. Pt has taken twice. She would like to try something else.  Husband informed of this while going over lab results. He states that wife takes the medication around 6-7pm. He does not know the name of it.

## 2021-01-20 NOTE — Telephone Encounter (Signed)
Her body can adapt to it if she continues to take it she will get used to it so she does not feel so sedated but if she feels like she does not want to stay with it then have her go back to her trazodone that she had previously or take over-the-counter melatonin with Benadryl.

## 2021-01-21 NOTE — Telephone Encounter (Signed)
Pt is aware of provider feedback and states she tried the trazodone previously and it didn't work. Advised her to try the Melatonin with the Benadryl per Dr Neldon Mc note and pt voiced understanding.

## 2021-01-22 ENCOUNTER — Other Ambulatory Visit: Payer: Self-pay | Admitting: Family Medicine

## 2021-01-22 DIAGNOSIS — I10 Essential (primary) hypertension: Secondary | ICD-10-CM

## 2021-02-15 ENCOUNTER — Telehealth: Payer: Self-pay | Admitting: Family Medicine

## 2021-02-15 DIAGNOSIS — B001 Herpesviral vesicular dermatitis: Secondary | ICD-10-CM

## 2021-02-15 MED ORDER — VALACYCLOVIR HCL 1 G PO TABS: ORAL_TABLET | ORAL | 2 refills | Status: DC

## 2021-02-15 NOTE — Telephone Encounter (Signed)
Pt aware of provider feedback and voiced understanding but states she doesn't have enough valtrex so refill sent in per pt request.

## 2021-02-15 NOTE — Telephone Encounter (Signed)
Pt called stating that she believes she has shingles on her hands. Wants to speak with a nurse about it.

## 2021-02-15 NOTE — Telephone Encounter (Signed)
Patient has rash on side of hand that looks like shingles.  She said she looked online and it looks exactly like what they show, started as itching and pain then blisters started yesterday.  She has Valtrex at home for fever blisters and has started taking two twice a day.  What is the recommended dosing for shingles?

## 2021-02-15 NOTE — Telephone Encounter (Signed)
Tell her it is recommended to take it at the 1000 mg 3 times a day for shingles for 7 days

## 2021-03-08 DIAGNOSIS — H02831 Dermatochalasis of right upper eyelid: Secondary | ICD-10-CM | POA: Diagnosis not present

## 2021-03-08 DIAGNOSIS — H43812 Vitreous degeneration, left eye: Secondary | ICD-10-CM | POA: Diagnosis not present

## 2021-03-08 DIAGNOSIS — H26491 Other secondary cataract, right eye: Secondary | ICD-10-CM | POA: Diagnosis not present

## 2021-03-08 DIAGNOSIS — H02834 Dermatochalasis of left upper eyelid: Secondary | ICD-10-CM | POA: Diagnosis not present

## 2021-03-08 DIAGNOSIS — H527 Unspecified disorder of refraction: Secondary | ICD-10-CM | POA: Diagnosis not present

## 2021-03-08 DIAGNOSIS — H11003 Unspecified pterygium of eye, bilateral: Secondary | ICD-10-CM | POA: Diagnosis not present

## 2021-03-08 DIAGNOSIS — Z961 Presence of intraocular lens: Secondary | ICD-10-CM | POA: Diagnosis not present

## 2021-03-11 ENCOUNTER — Ambulatory Visit: Payer: Medicare Other | Admitting: Family Medicine

## 2021-03-19 ENCOUNTER — Ambulatory Visit (INDEPENDENT_AMBULATORY_CARE_PROVIDER_SITE_OTHER): Payer: Medicare Other | Admitting: Family Medicine

## 2021-03-19 ENCOUNTER — Encounter: Payer: Self-pay | Admitting: Family Medicine

## 2021-03-19 VITALS — BP 190/91 | HR 95 | Ht 65.0 in | Wt 162.0 lb

## 2021-03-19 DIAGNOSIS — G4700 Insomnia, unspecified: Secondary | ICD-10-CM | POA: Diagnosis not present

## 2021-03-19 NOTE — Progress Notes (Signed)
? ?BP (!) 190/91   Pulse 95   Ht '5\' 5"'$  (1.651 m)   Wt 162 lb (73.5 kg)   SpO2 97%   BMI 26.96 kg/m?   ? ?Subjective:  ? ?Patient ID: Stacy Henry, female    DOB: 11/02/1945, 76 y.o.   MRN: [id number] ? ?HPI: ?Stacy Henry is a 76 y.o. female presenting on 03/19/2021 for Medical Management of Chronic Issues and Insomnia (1-2 month follow up. Tried seroquel for one week. Caused legs to lose feeling and pt felt too drowsy. Currently taking Melatonin and does not want a prescription at this time.) ? ? ?HPI ?Insomnia ?Patient is coming in with complaints of insomnia.  She used to be on Ambien but failed drug screen by taking benzodiazepine.  She did not like trazodone because it did not work.  Did not like Seroquel because it was too strong.  We discussed doxepin as a possible in between.  She had been on Ambien for quite some time before.  I had a list back and she had never been prescribed benzodiazepine.  She is using melatonin now and does not want any other prescriptions. ? ?Relevant past medical, surgical, family and social history reviewed and updated as indicated. Interim medical history since our last visit reviewed. ?Allergies and medications reviewed and updated. ? ?Review of Systems  ?Eyes:  Negative for visual disturbance.  ?Respiratory:  Negative for chest tightness and shortness of breath.   ?Cardiovascular:  Negative for chest pain and leg swelling.  ?Skin:  Negative for rash.  ?Psychiatric/Behavioral:  Positive for sleep disturbance. Negative for agitation and behavioral problems.   ?All other systems reviewed and are negative. ? ?Per HPI unless specifically indicated above ? ? ?Allergies as of 03/19/2021   ? ?   Reactions  ? Sulfa Antibiotics   ? ?  ? ?  ?Medication List  ?  ? ?  ? Accurate as of March 19, 2021  1:59 PM. If you have any questions, ask your nurse or doctor.  ?  ?  ? ?  ? ?STOP taking these medications   ? ?QUEtiapine 50 MG tablet ?Commonly known as: SEROquel ?Stopped by:  Worthy Rancher, MD ?  ? ?  ? ?TAKE these medications   ? ?CALCIUM 1200 PO ?Take by mouth. One a day ?  ?COLLAGEN PO ?Take by mouth. ?  ?fluticasone 50 MCG/ACT nasal spray ?Commonly known as: FLONASE ?Place 2 sprays into both nostrils daily. ?  ?losartan 50 MG tablet ?Commonly known as: COZAAR ?Take 1 tablet (50 mg total) by mouth daily. ?  ?multivitamin tablet ?Take 1 tablet by mouth daily. ?  ?pantoprazole 40 MG tablet ?Commonly known as: PROTONIX ?Take 1 tablet (40 mg total) by mouth daily. ?  ?PROBIOTIC PO ?Take by mouth. ?  ?valACYclovir 1000 MG tablet ?Commonly known as: VALTREX ?2gm twice a day for 24h for symptoms ?  ? ?  ? ? ? ?Objective:  ? ?BP (!) 190/91   Pulse 95   Ht '5\' 5"'$  (1.651 m)   Wt 162 lb (73.5 kg)   SpO2 97%   BMI 26.96 kg/m?   ?Wt Readings from Last 3 Encounters:  ?03/19/21 162 lb (73.5 kg)  ?01/11/21 162 lb (73.5 kg)  ?01/01/21 162 lb 9.6 oz (73.8 kg)  ?  ?Physical Exam ?Vitals and nursing note reviewed.  ?Constitutional:   ?   General: She is not in acute distress. ?   Appearance: She is well-developed. She is  not diaphoretic.  ?Eyes:  ?   Conjunctiva/sclera: Conjunctivae normal.  ?Skin: ?   General: Skin is warm and dry.  ?   Findings: No rash.  ?Neurological:  ?   Mental Status: She is alert and oriented to person, place, and time.  ?   Coordination: Coordination normal.  ?Psychiatric:     ?   Behavior: Behavior normal.  ? ? ? ? ?Assessment & Plan:  ? ?Problem List Items Addressed This Visit   ?None ?Visit Diagnoses   ? ? Insomnia, unspecified type    -  Primary  ? ?  ?  ?She will continue with melatonin for now, gave her the name of doxepin that is something that we could try in the future. ?Follow up plan: ?Return if symptoms worsen or fail to improve. ? ?Counseling provided for all of the vaccine components ?No orders of the defined types were placed in this encounter. ? ? ?Caryl Pina, MD ?[facility] ?03/19/2021, 1:59 PM ? ? ? ? ?

## 2021-05-04 DIAGNOSIS — B351 Tinea unguium: Secondary | ICD-10-CM | POA: Diagnosis not present

## 2021-05-04 DIAGNOSIS — M79675 Pain in left toe(s): Secondary | ICD-10-CM | POA: Diagnosis not present

## 2021-05-04 DIAGNOSIS — L600 Ingrowing nail: Secondary | ICD-10-CM | POA: Diagnosis not present

## 2021-05-04 DIAGNOSIS — M79674 Pain in right toe(s): Secondary | ICD-10-CM | POA: Diagnosis not present

## 2021-06-01 DIAGNOSIS — L821 Other seborrheic keratosis: Secondary | ICD-10-CM | POA: Diagnosis not present

## 2021-06-01 DIAGNOSIS — D0461 Carcinoma in situ of skin of right upper limb, including shoulder: Secondary | ICD-10-CM | POA: Diagnosis not present

## 2021-06-01 DIAGNOSIS — M79674 Pain in right toe(s): Secondary | ICD-10-CM | POA: Diagnosis not present

## 2021-06-01 DIAGNOSIS — L570 Actinic keratosis: Secondary | ICD-10-CM | POA: Diagnosis not present

## 2021-06-01 DIAGNOSIS — M79675 Pain in left toe(s): Secondary | ICD-10-CM | POA: Diagnosis not present

## 2021-06-01 DIAGNOSIS — L600 Ingrowing nail: Secondary | ICD-10-CM | POA: Diagnosis not present

## 2021-06-01 DIAGNOSIS — B351 Tinea unguium: Secondary | ICD-10-CM | POA: Diagnosis not present

## 2021-06-01 DIAGNOSIS — D045 Carcinoma in situ of skin of trunk: Secondary | ICD-10-CM | POA: Diagnosis not present

## 2021-06-01 DIAGNOSIS — Z85828 Personal history of other malignant neoplasm of skin: Secondary | ICD-10-CM | POA: Diagnosis not present

## 2021-06-07 ENCOUNTER — Encounter: Payer: Self-pay | Admitting: Nurse Practitioner

## 2021-06-07 ENCOUNTER — Ambulatory Visit (INDEPENDENT_AMBULATORY_CARE_PROVIDER_SITE_OTHER): Payer: Medicare Other | Admitting: Nurse Practitioner

## 2021-06-07 VITALS — BP 156/80 | HR 70 | Temp 96.8°F | Resp 20 | Ht 65.0 in | Wt 166.0 lb

## 2021-06-07 DIAGNOSIS — R300 Dysuria: Secondary | ICD-10-CM

## 2021-06-07 DIAGNOSIS — N3000 Acute cystitis without hematuria: Secondary | ICD-10-CM | POA: Diagnosis not present

## 2021-06-07 DIAGNOSIS — M79605 Pain in left leg: Secondary | ICD-10-CM

## 2021-06-07 LAB — MICROSCOPIC EXAMINATION
Epithelial Cells (non renal): NONE SEEN /hpf (ref 0–10)
RBC, Urine: NONE SEEN /hpf (ref 0–2)
Renal Epithel, UA: NONE SEEN /hpf

## 2021-06-07 LAB — URINALYSIS, COMPLETE
Bilirubin, UA: NEGATIVE
Ketones, UA: NEGATIVE
Nitrite, UA: POSITIVE — AB
RBC, UA: NEGATIVE
Specific Gravity, UA: 1.02 (ref 1.005–1.030)
Urobilinogen, Ur: 1 mg/dL (ref 0.2–1.0)
pH, UA: 5 (ref 5.0–7.5)

## 2021-06-07 MED ORDER — METHYLPREDNISOLONE ACETATE 80 MG/ML IJ SUSP
80.0000 mg | Freq: Once | INTRAMUSCULAR | Status: AC
  Administered 2021-06-07: 80 mg via INTRAMUSCULAR

## 2021-06-07 MED ORDER — CEPHALEXIN 500 MG PO CAPS: 500.0000 mg | ORAL_CAPSULE | Freq: Two times a day (BID) | ORAL | 0 refills | Status: DC

## 2021-06-07 MED ORDER — KETOROLAC TROMETHAMINE 60 MG/2ML IM SOLN
60.0000 mg | Freq: Once | INTRAMUSCULAR | Status: AC
  Administered 2021-06-07: 60 mg via INTRAMUSCULAR

## 2021-06-07 NOTE — Patient Instructions (Signed)
Radicular Pain Radicular pain is a type of pain that spreads from your back or neck along a spinal nerve. Spinal nerves are nerves that leave the spinal cord and go to the muscles. Radicular pain is sometimes called radiculopathy, radiculitis, or a pinched nerve. When you have this type of pain, you may also have weakness, numbness, or tingling in the area of your body that is supplied by the nerve. The pain may feel sharp and burning. Depending on which spinal nerve is affected, the pain may occur in the: Neck area (cervical radicular pain). You may also feel pain, numbness, weakness, or tingling in the arms. Mid-spine area (thoracic radicular pain). You would feel this pain in the back and chest. This type is rare. Lower back area (lumbar radicular pain). You would feel this pain as low back pain. You may feel pain, numbness, weakness, or tingling in the buttocks or legs. Sciatica is a type of lumbar radicular pain that shoots down the back of the leg. Radicular pain occurs when one of the spinal nerves becomes irritated or squeezed (compressed). It is often caused by something pushing on a spinal nerve, such as one of the bones of the spine (vertebrae) or one of the round cushions between vertebrae (intervertebral disks). This can result from: An injury. Wear and tear or aging of a disk. The growth of a bone spur that pushes on the nerve. Radicular pain often goes away when you follow instructions from your health care provider for relieving pain at home. How is this treated? Treatment may depend on the cause of the condition and may include: Working with a physical therapist. Taking pain medicine. Applying heat or ice or both to the affected areas. Doing stretches to improve flexibility. Having surgery. This may be needed if other treatments do not help. Different types of surgery may be done depending on the cause of this condition. Follow these instructions at home: Managing pain     If  directed, put ice on the affected area. To do this: Put ice in a plastic bag. Place a towel between your skin and the bag. Leave the ice on for 20 minutes, 2-3 times a day. Remove the ice if your skin turns bright red. This is very important. If you cannot feel pain, heat, or cold, you have a greater risk of damage to the area. If directed, apply heat to the affected area as often as told by your health care provider. Use the heat source that your health care provider recommends, such as a moist heat pack or a heating pad. Place a towel between your skin and the heat source. Leave the heat on for 20-30 minutes. Remove the heat if your skin turns bright red. This is especially important if you are unable to feel pain, heat, or cold. You have a greater risk of getting burned. Activity Do not sit or rest in bed for long periods of time. Try to stay as active as possible. Ask your health care provider what type of exercise or activity is best for you. Avoid activities that make your pain worse, such as bending and lifting. You may have to avoid lifting. Ask your health care provider how much you can safely lift. Practice using proper technique when lifting items. Proper lifting technique involves bending your knees and rising up. Do strength and range-of-motion exercises only as told by your health care provider or physical therapist. General instructions Take over-the-counter and prescription medicines only as told by your   health care provider. Pay attention to any changes in your symptoms. Keep all follow-up visits. This is important. Contact a health care provider if: Your pain and other symptoms get worse. Your pain medicine is not helping. Your pain has not improved after a few weeks of home care. You have a fever. Get help right away if: You have severe pain, weakness, or numbness. You have difficulty with bladder or bowel control. Summary Radicular pain is a type of pain that spreads  from your back or neck along a spinal nerve. When you have radicular pain, you may also have weakness, numbness, or tingling in the area of your body that is supplied by the nerve. The pain may feel sharp or burning. Radicular pain may be treated with ice, heat, medicines, or physical therapy. This information is not intended to replace advice given to you by your health care provider. Make sure you discuss any questions you have with your health care provider. Document Revised: 06/25/2020 Document Reviewed: 06/25/2020 Elsevier Patient Education  2023 Elsevier Inc.  

## 2021-06-07 NOTE — Progress Notes (Signed)
Subjective:    Patient ID: Merit Gadsby, female    DOB: 1945/03/03, 76 y.o.   MRN: [id number]   Chief Complaint: Left leg pain and Dysuria   Dysuria  Associated symptoms include frequency and urgency.  Patient comes in with 2 complaints: - dysuria- started yesterday. Every time she goes with frequency and urgency. She has taken AZO and that helped her some during the night. - left leg pain. Started about 1 month ago. Feels like an ache and throbbing at times. Rate [place] 8/10. Elevating increases pain. Walking does not affect it at all. Tylenol helps.     Review of Systems  Genitourinary:  Positive for dysuria, frequency and urgency.  All other systems reviewed and are negative.     Objective:   Physical Exam Vitals and nursing note reviewed.  Constitutional:      General: She is not in acute distress.    Appearance: Normal appearance. She is well-developed.  Neck:     Vascular: No carotid bruit or JVD.  Cardiovascular:     Rate and Rhythm: Normal rate and regular rhythm.     Heart sounds: Normal heart sounds.  Pulmonary:     Effort: Pulmonary effort is normal. No respiratory distress.     Breath sounds: Normal breath sounds. No wheezing or rales.  Chest:     Chest wall: No tenderness.  Abdominal:     General: Bowel sounds are normal. There is no distension or abdominal bruit.     Palpations: Abdomen is soft. There is no hepatomegaly, splenomegaly, mass or pulsatile mass.     Tenderness: There is no abdominal tenderness.  Musculoskeletal:        General: Normal range of motion.     Cervical back: Normal range of motion and neck supple.     Comments: Ain on palpation of thigh No calf pain or swelling Palpable pedal pulse  Lymphadenopathy:     Cervical: No cervical adenopathy.  Skin:    General: Skin is warm and dry.  Neurological:     Mental Status: She is alert and oriented to person, place, and time.     Deep Tendon Reflexes: Reflexes are normal and  symmetric.  Psychiatric:        Behavior: Behavior normal.        Thought Content: Thought content normal.        Judgment: Judgment normal.    BP (!) 156/80   Pulse 70   Temp (!) 96.8 F (36 C) (Temporal)   Resp 20   Ht '5\' 5"'$  (1.651 m)   Wt 166 lb (75.3 kg)   SpO2 95%   BMI 27.62 kg/m        Assessment & Plan:   [name] in today with chief complaint of Left leg pain and Dysuria   1. Dysuria - Urinalysis, Complete  2. Acute cystitis without hematuria Take medication as prescribe Cotton underwear Take shower not bath Cranberry juice, yogurt Force fluids AZO over the counter X2 days Culture pending RTO prn Meds ordered this encounter  Medications   ketorolac (TORADOL) injection 60 mg   methylPREDNISolone acetate (DEPO-MEDROL) injection 80 mg   cephALEXin (KEFLEX) 500 MG capsule    Sig: Take 1 capsule (500 mg total) by mouth 2 (two) times daily.    Dispense:  14 capsule    Refill:  0    Order Specific Question:   Supervising Provider    Answer:   Caryl Pina A [id number]    -  Urine Culture  3. Left leg pain Moist heat when hurting Rest RTO if does not improve. - ketorolac (TORADOL) injection 60 mg - methylPREDNISolone acetate (DEPO-MEDROL) injection 80 mg    The above assessment and management plan was discussed with the patient. The patient verbalized understanding of and has agreed to the management plan. Patient is aware to call the clinic if symptoms persist or worsen. Patient is aware when to return to the clinic for a follow-up visit. Patient educated on when it is appropriate to go to the emergency department.   Mary-Margaret Hassell Done, FNP

## 2021-06-08 LAB — URINE CULTURE

## 2021-07-01 DIAGNOSIS — L600 Ingrowing nail: Secondary | ICD-10-CM | POA: Diagnosis not present

## 2021-07-01 DIAGNOSIS — M79675 Pain in left toe(s): Secondary | ICD-10-CM | POA: Diagnosis not present

## 2021-07-01 DIAGNOSIS — B351 Tinea unguium: Secondary | ICD-10-CM | POA: Diagnosis not present

## 2021-07-01 DIAGNOSIS — M79674 Pain in right toe(s): Secondary | ICD-10-CM | POA: Diagnosis not present

## 2021-07-20 ENCOUNTER — Ambulatory Visit: Payer: Medicare Other

## 2021-07-29 DIAGNOSIS — M79674 Pain in right toe(s): Secondary | ICD-10-CM | POA: Diagnosis not present

## 2021-07-29 DIAGNOSIS — M79675 Pain in left toe(s): Secondary | ICD-10-CM | POA: Diagnosis not present

## 2021-07-29 DIAGNOSIS — B351 Tinea unguium: Secondary | ICD-10-CM | POA: Diagnosis not present

## 2021-07-29 DIAGNOSIS — L600 Ingrowing nail: Secondary | ICD-10-CM | POA: Diagnosis not present

## 2021-08-20 ENCOUNTER — Ambulatory Visit (INDEPENDENT_AMBULATORY_CARE_PROVIDER_SITE_OTHER): Payer: Medicare Other

## 2021-08-20 VITALS — Wt 166.0 lb

## 2021-08-20 DIAGNOSIS — Z0000 Encounter for general adult medical examination without abnormal findings: Secondary | ICD-10-CM | POA: Diagnosis not present

## 2021-08-20 NOTE — Progress Notes (Signed)
Subjective:   Stacy Henry is a 76 y.o. female who presents for Medicare Annual (Subsequent) preventive examination.  Virtual Visit via Telephone Note  I connected with  Stacy Henry on 08/20/21 at  2:45 PM EDT by telephone and verified that I am speaking with the correct person using two identifiers.  Location: Patient: Home Provider: [facility] Persons participating in the virtual visit: patient/Nurse Health Advisor   I discussed the limitations, risks, security and privacy concerns of performing an evaluation and management service by telephone and the availability of in person appointments. The patient expressed understanding and agreed to proceed.  Interactive audio and video telecommunications were attempted between this nurse and patient, however failed, due to patient having technical difficulties OR patient did not have access to video capability.  We continued and completed visit with audio only.  Some vital signs may be absent or patient reported.   Davidmichael Zarazua E Denzal Meir, LPN   Review of Systems     Cardiac Risk Factors include: advanced age (>63mn, >>57women);dyslipidemia;hypertension     Objective:    Today's Vitals   08/20/21 1419 08/20/21 1421  Weight: 166 lb (75.3 kg)   PainSc:  5    Body mass index is 27.62 kg/m.     08/20/2021    2:25 PM 07/17/2020   10:34 AM 10/30/2018    9:58 AM 10/24/2017   11:59 AM 11/01/2016   11:53 PM 10/20/2015    2:38 PM 12/10/2014   11:44 AM  Advanced Directives  Does Patient Have a Medical Advance Directive? No No No No No No No  Would patient like information on creating a medical advance directive? No - Patient declined No - Patient declined No - Patient declined Yes (MAU/Ambulatory/Procedural Areas - Information given)  Yes - Educational materials given Yes - Educational materials given    Current Medications (verified) Outpatient Encounter Medications as of 08/20/2021  Medication Sig   Calcium Carbonate-Vit D-Min  (CALCIUM 1200 PO) Take by mouth. One a day   COLLAGEN PO Take by mouth.   fluticasone (FLONASE) 50 MCG/ACT nasal spray Place 2 sprays into both nostrils daily.   losartan (COZAAR) 50 MG tablet Take 1 tablet (50 mg total) by mouth daily.   Multiple Vitamin (MULTIVITAMIN) tablet Take 1 tablet by mouth daily.   pantoprazole (PROTONIX) 40 MG tablet Take 1 tablet (40 mg total) by mouth daily.   Probiotic Product (PROBIOTIC PO) Take by mouth.   valACYclovir (VALTREX) 1000 MG tablet 2gm twice a day for 24h for symptoms (Patient not taking: Reported on 08/20/2021)   [DISCONTINUED] cephALEXin (KEFLEX) 500 MG capsule Take 1 capsule (500 mg total) by mouth 2 (two) times daily.   [DISCONTINUED] terbinafine (LAMISIL) 250 MG tablet Take 250 mg by mouth daily.   No facility-administered encounter medications on file as of 08/20/2021.    Allergies (verified) Sulfa antibiotics   History: Past Medical History:  Diagnosis Date   Allergy    GERD (gastroesophageal reflux disease)    HTN (hypertension)    Hyperlipidemia    Insomnia    [place]   Osteopenia    Past Surgical History:  Procedure Laterality Date   APPENDECTOMY  as teenager   BREAST BIOPSY Bilateral    years ago benign   BREAST EXCISIONAL BIOPSY Right    years ago benign   BREAST SURGERY Bilateral    "lump"removal   COLONOSCOPY  2006   RMR: normal.   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   COLONOSCOPY N/A 12/03/2014   Procedure: COLONOSCOPY;  Surgeon: Daneil Dolin, MD;  Location: [facility];  Service: Endoscopy;  Laterality: N/A;  0900   EYE SURGERY Bilateral    FINGER SURGERY     Family History  Problem Relation Age of Onset   Alcohol abuse Sister    Alcoholism Sister    Diabetes Brother    Heart attack Brother 44   Aneurysm Brother    Alcoholism Brother    Diabetes Brother    Cirrhosis Brother    Alcohol abuse Brother    Alcoholism Brother     Hypertension Mother    Atrial fibrillation Mother    Macular degeneration Mother    Heart attack Father 57   Kidney disease Sister    Atrial fibrillation Sister    Colon cancer Neg Hx    Social History   Socioeconomic History   Marital status: Married    Spouse name: Kyung Rudd   Number of children: 1   Years of education: 13   Highest education level: Some college, no degree  Occupational History   Occupation: Electronics engineer  Tobacco Use   Smoking status: Never   Smokeless tobacco: Never  Vaping Use   Vaping Use: Never used  Substance and Sexual Activity   Alcohol use: Yes    Comment: ocassional wine - less than weekly   Drug use: No   Sexual activity: Yes    Partners: Male    Birth control/protection: Post-menopausal  Other Topics Concern   Not on file  Social History Narrative   Not on file   Social Determinants of Health   Financial Resource Strain: Low Risk  (08/20/2021)   Overall Financial Resource Strain (CARDIA)    Difficulty of Paying Living Expenses: Not hard at all  Food Insecurity: No Food Insecurity (08/20/2021)   Hunger Vital Sign    Worried About Running Out of Food in the Last Year: Never true    Ran Out of Food in the Last Year: Never true  Transportation Needs: No Transportation Needs (08/20/2021)   PRAPARE - Hydrologist (Medical): No    Lack of Transportation (Non-Medical): No  Physical Activity: Sufficiently Active (08/20/2021)   Exercise Vital Sign    Days of Exercise per Week: 4 days    Minutes of Exercise per Session: 40 min  Stress: No Stress Concern Present (08/20/2021)   [name]    Feeling of Stress : Only a little  Social Connections: Socially Integrated (08/20/2021)   Social Connection and Isolation Panel [NHANES]    Frequency of Communication with Friends and Family: More than three times a week    Frequency of Social Gatherings with  Friends and Family: More than three times a week    Attends Religious Services: More than 4 times per year    Active Member of [employer] or Organizations: Yes    Attends Music therapist: More than 4 times per year    Marital Status: Married    Tobacco Counseling Counseling given: Not Answered   Clinical Intake:  Pre-visit preparation completed: Yes  Pain : 0-10 Pain Score: 5  Pain Type: Chronic pain Pain Location: Leg Pain Orientation: Right, Left Pain Descriptors / Indicators: Aching, Discomfort Pain Onset: 1 to 4 weeks ago Pain Frequency: Intermittent     BMI - recorded: 27.62 Nutritional Status: BMI 25 -29  Overweight Nutritional Risks: None Diabetes: No  How often do you need to have someone help you when you read instructions, pamphlets, or other written materials from your doctor or pharmacy?: 1 - Never  Diabetic? no  Interpreter Needed?: No  Information entered by :: Shana Zavaleta, LPN   Activities of Daily Living    08/20/2021    2:35 PM  In your present state of health, do you have any difficulty performing the following activities:  Hearing? 0  Vision? 0  Difficulty concentrating or making decisions? 0  Walking or climbing stairs? 0  Dressing or bathing? 0  Doing errands, shopping? 0  Preparing Food and eating ? N  Using the Toilet? N  In the past six months, have you accidently leaked urine? Y  Comment mild -if she waits too long - urgency  Do you have problems with loss of bowel control? N  Managing your Medications? N  Managing your Finances? N  Housekeeping or managing your Housekeeping? N    Patient Care Team: Dettinger, Fransisca Kaufmann, MD as PCP - General (Family Medicine) Burnell Blanks, MD as PCP - Cardiology (Cardiology) Dettinger, Fransisca Kaufmann, MD as PCP - Family Medicine (Family Medicine) Druscilla Brownie, MD as Consulting Physician (Dermatology) Gala Romney Cristopher Estimable, MD as Consulting Physician (Gastroenterology)  Indicate any  recent Medical Services you may have received from other than [facility] providers in the past year (date may be approximate).     Assessment:   This is a routine wellness examination for [place].  Hearing/Vision screen Hearing Screening - Comments:: Denies hearing difficulties   Vision Screening - Comments:: Had lens replacement surgery - reading glasses prn only - up to date with annual eye exams with Dr August Albino in [place]   Dietary issues and exercise activities discussed: Current Exercise Habits: Home exercise routine, Type of exercise: walking;Other - see comments (yard work, house work, gardening, Social research officer, government), Time (Minutes): 40, Frequency (Times/Week): 5, Weekly Exercise (Minutes/Week): 200, Intensity: Mild, Exercise limited by: None identified   Goals Addressed             This Visit's Progress    Patient Stated       08/20/21 - "I hope to stay as healthy and active as I already am and help get Ray out of his slump"       Depression Screen    08/20/2021    2:24 PM 03/19/2021    1:14 PM 01/11/2021    8:23 AM 01/01/2021   11:55 AM 08/03/2020    9:42 AM 07/20/2020    1:30 PM 07/17/2020   10:33 AM  PHQ 2/9 Scores  PHQ - 2 Score 0 0 0 '1 1 2 '$ 0  PHQ- 9 Score  '3 2 5  6 5    '$ Fall Risk    08/20/2021    2:22 PM 03/19/2021    1:14 PM 03/19/2021    1:13 PM 01/11/2021    8:22 AM 01/01/2021   11:55 AM  [place] in the past year? 0 1 0 0 0  Number falls in past yr: 0 0     Injury with Fall? 0 0     Risk for fall due to : Orthopedic patient Impaired balance/gait     Follow up Falls prevention discussed Falls evaluation completed       [place]:  Any stairs in or around the home? Yes  If so, are there any without handrails? No  Home free of loose throw rugs in walkways, pet beds, electrical cords, etc? Yes  Adequate lighting in your home to reduce risk of falls? Yes   ASSISTIVE DEVICES UTILIZED TO PREVENT FALLS:  Life alert? No  Use  of a cane, walker or w/c? No  Grab bars in the bathroom? Yes  Shower chair or bench in shower? No  Elevated toilet seat or a handicapped toilet? Yes   TIMED UP AND GO:  Was the test performed? No . Telephonic visit  Cognitive Function:    10/24/2017   11:58 AM 10/19/2016   10:33 AM 10/20/2015   10:14 PM 12/10/2014   10:19 PM  MMSE - Mini Mental State Exam  Orientation to time '5 5 5 5  '$ Orientation to Place '5 5 5 5  '$ Registration '3 3 3 3  '$ Attention/ Calculation '5 3 5 5  '$ Recall '3 3 3 3  '$ Language- name 2 objects '2 2 2 2  '$ Language- repeat '1 1 1 1  '$ Language- follow 3 step command '3 3 3 3  '$ Language- read & follow direction '1 1 1 1  '$ Write a sentence '1 1 1 1  '$ Copy design '1 1 1 1  '$ Total score '30 28 30 30        '$ 08/20/2021    2:26 PM 10/30/2018   10:01 AM  6CIT Screen  What Year? 0 points 0 points  What month? 0 points 0 points  What time? 0 points 0 points  Count back from 20 0 points 0 points  Months in reverse 0 points 0 points  Repeat phrase 0 points 0 points  Total Score 0 points 0 points    Immunizations Immunization History  Administered Date(s) Administered   Fluad Quad(high Dose 65+) 10/31/2018, 10/07/2019, 11/06/2020   Influenza, High Dose Seasonal PF 10/19/2016, 10/04/2017   Influenza,inj,Quad PF,6+ Mos 10/25/2012, 12/10/2014, 10/20/2015   Moderna Sars-Covid-2 Vaccination 02/07/2019, 03/08/2019, 01/14/2020   Pneumococcal Conjugate-13 12/10/2014   Pneumococcal Polysaccharide-23 04/21/2009   Td 01/03/2009   Zoster, Live 04/21/2009    TDAP status: Due, Education has been provided regarding the importance of this vaccine. Advised may receive this vaccine at local pharmacy or Health Dept. Aware to provide a copy of the vaccination record if obtained from local pharmacy or Health Dept. Verbalized acceptance and understanding.  Flu Vaccine status: Up to date  Pneumococcal vaccine status: Up to date  Covid-19 vaccine status: Completed vaccines  Qualifies for  Shingles Vaccine? Yes   Zostavax completed Yes   Shingrix Completed?: No.    Education has been provided regarding the importance of this vaccine. Patient has been advised to call insurance company to determine out of pocket expense if they have not yet received this vaccine. Advised may also receive vaccine at local pharmacy or Health Dept. Verbalized acceptance and understanding.  Screening Tests Health Maintenance  Topic Date Due   Zoster Vaccines- Shingrix (1 of 2) Never done   TETANUS/TDAP  01/04/2019   INFLUENZA VACCINE  08/03/2021   DEXA SCAN  03/20/2022 (Originally 03/10/2021)   COVID-19 Vaccine (4 - Moderna risk series) 04/18/2022 (Originally 03/10/2020)   MAMMOGRAM  09/22/2021   Pneumonia Vaccine 30+ Years old  Completed   Hepatitis C Screening  Completed   HPV VACCINES  Aged Out   COLONOSCOPY (Pts 45-92yr Insurance coverage will need to be confirmed)  Discontinued    Health Maintenance  Health Maintenance Due  Topic Date Due   Zoster Vaccines- Shingrix (1 of 2) Never done  TETANUS/TDAP  01/04/2019   INFLUENZA VACCINE  08/03/2021    Colorectal cancer screening: No longer required.   Mammogram status: Completed 09/22/2020. Repeat every year  Bone Density status: Completed 03/11/2019. Results reflect: Bone density results: OSTEOPENIA. Repeat every 2 years.  Lung Cancer Screening: (Low Dose CT Chest recommended if Age 35-80 years, 30 pack-year currently smoking OR have quit w/in 15years.) does not qualify.   Additional Screening:  Hepatitis C Screening: does qualify; Completed 05/10/2016  Vision Screening: Recommended annual ophthalmology exams for early detection of glaucoma and other disorders of the eye. Is the patient up to date with their annual eye exam?  Yes  Who is the provider or what is the name of the office in which the patient attends annual eye exams? Dr August Albino in [place]  If pt is not established with a provider, would they like to be referred to  a provider to establish care? No .   Dental Screening: Recommended annual dental exams for proper oral hygiene  Community Resource Referral / Chronic Care Management: CRR required this visit?  No   CCM required this visit?  No      Plan:     I have personally reviewed and noted the following in the patient's chart:   Medical and social history Use of alcohol, tobacco or illicit drugs  Current medications and supplements including opioid prescriptions. Patient is not currently taking opioid prescriptions. Functional ability and status Nutritional status Physical activity Advanced directives List of other physicians Hospitalizations, surgeries, and ER visits in previous 12 months Vitals Screenings to include cognitive, depression, and falls Referrals and appointments  In addition, I have reviewed and discussed with patient certain preventive protocols, quality metrics, and best practice recommendations. A written personalized care plan for preventive services as well as general preventive health recommendations were provided to patient.     Sandrea Hammond, LPN   [id number]   Nurse Notes: Her lower legs have been aching for a while and now for the last couple of weeks her ankles are swelling - made her appt for Monday.

## 2021-08-20 NOTE — Patient Instructions (Signed)
Stacy Henry , Thank you for taking time to come for your Medicare Wellness Visit. I appreciate your ongoing commitment to your health goals. Please review the following plan we discussed and let me know if I can assist you in the future.   Screening recommendations/referrals: Colonoscopy: Done 12/03/2014 - no repeat required Mammogram: Done 09/22/2020 - Repeat annually  Bone Density: Done 03/11/2019 - Repeat every 2 years  Recommended yearly ophthalmology/optometry visit for glaucoma screening and checkup Recommended yearly dental visit for hygiene and checkup  Vaccinations: Influenza vaccine: Done 11/06/2020 - Repeat annually  Pneumococcal vaccine: Done 04/21/2009 & 12/10/2014 Tdap vaccine: Done 2011 - Repeat in 10 years *due Shingles vaccine: *due - Shingrix is 2 doses 2-6 months apart and over 90% effective     Covid-19: Done  02/07/2019, 03/08/2019, 01/14/2020  Advanced directives: Advance directive discussed with you today. Even though you declined this today, please call our office should you change your mind, and we can give you the proper paperwork for you to fill out.   Conditions/risks identified: Aim for 30 minutes of exercise or brisk walking, 6-8 glasses of water, and 5 servings of fruits and vegetables each day.   Next appointment: Follow up in one year for your annual wellness visit    Preventive Care 65 Years and Older, Female Preventive care refers to lifestyle choices and visits with your health care provider that can promote health and wellness. What does preventive care include? A yearly physical exam. This is also called an annual well check. Dental exams once or twice a year. Routine eye exams. Ask your health care provider how often you should have your eyes checked. Personal lifestyle choices, including: Daily care of your teeth and gums. Regular physical activity. Eating a healthy diet. Avoiding tobacco and drug use. Limiting alcohol use. Practicing safe sex. Taking  low-dose aspirin every day. Taking vitamin and mineral supplements as recommended by your health care provider. What happens during an annual well check? The services and screenings done by your health care provider during your annual well check will depend on your age, overall health, lifestyle risk factors, and family history of disease. Counseling  Your health care provider may ask you questions about your: Alcohol use. Tobacco use. Drug use. Emotional well-being. Home and relationship well-being. Sexual activity. Eating habits. History of falls. Memory and ability to understand (cognition). Work and work Statistician. Reproductive health. Screening  You may have the following tests or measurements: Height, weight, and BMI. Blood pressure. Lipid and cholesterol levels. These may be checked every 5 years, or more frequently if you are over 49 years old. Skin check. Lung cancer screening. You may have this screening every year starting at age 36 if you have a 30-pack-year history of smoking and currently smoke or have quit within the past 15 years. Fecal occult blood test (FOBT) of the stool. You may have this test every year starting at age 70. Flexible sigmoidoscopy or colonoscopy. You may have a sigmoidoscopy every 5 years or a colonoscopy every 10 years starting at age 34. Hepatitis C blood test. Hepatitis B blood test. Sexually transmitted disease (STD) testing. Diabetes screening. This is done by checking your blood sugar (glucose) after you have not eaten for a while (fasting). You may have this done every 1-3 years. Bone density scan. This is done to screen for osteoporosis. You may have this done starting at age 13. Mammogram. This may be done every 1-2 years. Talk to your health care provider about  how often you should have regular mammograms. Talk with your health care provider about your test results, treatment options, and if necessary, the need for more tests. Vaccines   Your health care provider may recommend certain vaccines, such as: Influenza vaccine. This is recommended every year. Tetanus, diphtheria, and acellular pertussis (Tdap, Td) vaccine. You may need a Td booster every 10 years. Zoster vaccine. You may need this after age 77. Pneumococcal 13-valent conjugate (PCV13) vaccine. One dose is recommended after age 53. Pneumococcal polysaccharide (PPSV23) vaccine. One dose is recommended after age 50. Talk to your health care provider about which screenings and vaccines you need and how often you need them. This information is not intended to replace advice given to you by your health care provider. Make sure you discuss any questions you have with your health care provider. Document Released: 01/16/2015 Document Revised: 09/09/2015 Document Reviewed: 10/21/2014 Elsevier Interactive Patient Education  2017 [place] Prevention in the Home Falls can cause injuries. They can happen to people of all ages. There are many things you can do to make your home safe and to help prevent falls. What can I do on the outside of my home? Regularly fix the edges of walkways and driveways and fix any cracks. Remove anything that might make you trip as you walk through a door, such as a raised step or threshold. Trim any bushes or trees on the path to your home. Use bright outdoor lighting. Clear any walking paths of anything that might make someone trip, such as rocks or tools. Regularly check to see if handrails are loose or broken. Make sure that both sides of any steps have handrails. Any raised decks and porches should have guardrails on the edges. Have any leaves, snow, or ice cleared regularly. Use sand or salt on walking paths during winter. Clean up any spills in your garage right away. This includes oil or grease spills. What can I do in the bathroom? Use night lights. Install grab bars by the toilet and in the tub and shower. Do not use towel  bars as grab bars. Use non-skid mats or decals in the tub or shower. If you need to sit down in the shower, use a plastic, non-slip stool. Keep the floor dry. Clean up any water that spills on the floor as soon as it happens. Remove soap buildup in the tub or shower regularly. Attach bath mats securely with double-sided non-slip rug tape. Do not have throw rugs and other things on the floor that can make you trip. What can I do in the bedroom? Use night lights. Make sure that you have a light by your bed that is easy to reach. Do not use any sheets or blankets that are too big for your bed. They should not hang down onto the floor. Have a firm chair that has side arms. You can use this for support while you get dressed. Do not have throw rugs and other things on the floor that can make you trip. What can I do in the kitchen? Clean up any spills right away. Avoid walking on wet floors. Keep items that you use a lot in easy-to-reach places. If you need to reach something above you, use a strong step stool that has a grab bar. Keep electrical cords out of the way. Do not use floor polish or wax that makes floors slippery. If you must use wax, use non-skid floor wax. Do not have throw rugs and other  things on the floor that can make you trip. What can I do with my stairs? Do not leave any items on the stairs. Make sure that there are handrails on both sides of the stairs and use them. Fix handrails that are broken or loose. Make sure that handrails are as long as the stairways. Check any carpeting to make sure that it is firmly attached to the stairs. Fix any carpet that is loose or worn. Avoid having throw rugs at the top or bottom of the stairs. If you do have throw rugs, attach them to the floor with carpet tape. Make sure that you have a light switch at the top of the stairs and the bottom of the stairs. If you do not have them, ask someone to add them for you. What else can I do to help  prevent falls? Wear shoes that: Do not have high heels. Have rubber bottoms. Are comfortable and fit you well. Are closed at the toe. Do not wear sandals. If you use a stepladder: Make sure that it is fully opened. Do not climb a closed stepladder. Make sure that both sides of the stepladder are locked into place. Ask someone to hold it for you, if possible. Clearly mark and make sure that you can see: Any grab bars or handrails. First and last steps. Where the edge of each step is. Use tools that help you move around (mobility aids) if they are needed. These include: Canes. Walkers. Scooters. Crutches. Turn on the lights when you go into a dark area. Replace any light bulbs as soon as they burn out. Set up your furniture so you have a clear path. Avoid moving your furniture around. If any of your floors are uneven, fix them. If there are any pets around you, be aware of where they are. Review your medicines with your doctor. Some medicines can make you feel dizzy. This can increase your chance of falling. Ask your doctor what other things that you can do to help prevent falls. This information is not intended to replace advice given to you by your health care provider. Make sure you discuss any questions you have with your health care provider. Document Released: 10/16/2008 Document Revised: 05/28/2015 Document Reviewed: 01/24/2014 Elsevier Interactive Patient Education  2017 [employer].

## 2021-08-23 ENCOUNTER — Ambulatory Visit (INDEPENDENT_AMBULATORY_CARE_PROVIDER_SITE_OTHER): Payer: Medicare Other | Admitting: Family Medicine

## 2021-08-23 ENCOUNTER — Encounter: Payer: Self-pay | Admitting: Family Medicine

## 2021-08-23 VITALS — BP 175/78 | HR 77 | Temp 98.0°F | Ht 65.0 in | Wt 165.0 lb

## 2021-08-23 DIAGNOSIS — I10 Essential (primary) hypertension: Secondary | ICD-10-CM | POA: Diagnosis not present

## 2021-08-23 DIAGNOSIS — M5416 Radiculopathy, lumbar region: Secondary | ICD-10-CM | POA: Diagnosis not present

## 2021-08-23 DIAGNOSIS — R600 Localized edema: Secondary | ICD-10-CM

## 2021-08-23 DIAGNOSIS — R609 Edema, unspecified: Secondary | ICD-10-CM | POA: Diagnosis not present

## 2021-08-23 MED ORDER — HYDROCHLOROTHIAZIDE 12.5 MG PO TABS: 12.5000 mg | ORAL_TABLET | Freq: Every day | ORAL | 3 refills | Status: DC

## 2021-08-23 MED ORDER — LOSARTAN POTASSIUM 100 MG PO TABS: 100.0000 mg | ORAL_TABLET | Freq: Every day | ORAL | 1 refills | Status: DC

## 2021-08-23 MED ORDER — PREDNISONE 20 MG PO TABS: ORAL_TABLET | ORAL | 0 refills | Status: DC

## 2021-08-23 NOTE — Progress Notes (Signed)
BP (!) 175/78   Pulse 77   Temp 98 F (36.7 C)   Ht 5' 5" (1.651 m)   Wt 165 lb (74.8 kg)   SpO2 98%   BMI 27.46 kg/m    Subjective:   Patient ID: Stacy Henry, female    DOB: [birth year], 76 y.o.   MRN: [id number]  HPI: Stacy Henry is a 76 y.o. female presenting on 08/23/2021 for Edema (And pain BLE. L>R) and Hypertension (Running hight at home and having HA's)   HPI Patient has a couple different issues going on when his leg pain that she describes as achy sensation that runs down her thigh and all the way down her leg.  She says is been going on for a month off and on and that she is also had edema in both legs slightly more in the left than right that is been going off and on for the past month.  She was concerned about a few of these different things.  She is also had elevated blood pressure over the past month.  She says its been running as high as 197 and then she doubled her medicines and its been running in the 175/78 since she started doubling her losartan.  She denies any chest pain or lightheadedness or dizziness or numbness or weakness.  She does not know what is changed over the last little bit but she is especially concerned about the blood pressure and the swelling and the pain.  She has been having some headache and hearing her blood pressure in her ears as well sometimes off and on.  She says the back and leg pain has caused her to almost give out in that leg a couple times and gets better with Tylenol and when she is on her feet gets worse.  She says that swelling will come up during the day but usually goes away at night.  Relevant past medical, surgical, family and social history reviewed and updated as indicated. Interim medical history since our last visit reviewed. Allergies and medications reviewed and updated.  Review of Systems  Constitutional:  Negative for chills and fever.  Eyes:  Negative for visual disturbance.  Respiratory:  Negative for  chest tightness and shortness of breath.   Cardiovascular:  Positive for leg swelling. Negative for chest pain.  Genitourinary:  Negative for difficulty urinating and dysuria.  Musculoskeletal:  Positive for arthralgias, gait problem and myalgias. Negative for back pain.  Skin:  Negative for rash.  Neurological:  Positive for weakness. Negative for light-headedness, numbness and headaches.  Psychiatric/Behavioral:  Negative for agitation and behavioral problems.   All other systems reviewed and are negative.   Per HPI unless specifically indicated above   Allergies as of 08/23/2021       Reactions   Sulfa Antibiotics         Medication List        Accurate as of August 23, 2021  9:55 AM. If you have any questions, ask your nurse or doctor.          CALCIUM 1200 PO Take by mouth. One a day   COLLAGEN PO Take by mouth.   fluticasone 50 MCG/ACT nasal spray Commonly known as: FLONASE Place 2 sprays into both nostrils daily.   hydrochlorothiazide 12.5 MG tablet Commonly known as: HYDRODIURIL Take 1 tablet (12.5 mg total) by mouth daily. Started by: Fransisca Kaufmann , MD   losartan 100 MG tablet Commonly known as: COZAAR Take  1 tablet (100 mg total) by mouth daily. What changed:  medication strength how much to take Changed by: Worthy Rancher, MD   multivitamin tablet Take 1 tablet by mouth daily.   pantoprazole 40 MG tablet Commonly known as: PROTONIX Take 1 tablet (40 mg total) by mouth daily.   predniSONE 20 MG tablet Commonly known as: DELTASONE 2 po at same time daily for 5 days Started by: Worthy Rancher, MD   PROBIOTIC PO Take by mouth.   valACYclovir 1000 MG tablet Commonly known as: VALTREX 2gm twice a day for 24h for symptoms         Objective:   BP (!) 175/78   Pulse 77   Temp 98 F (36.7 C)   Ht 5' 5" (1.651 m)   Wt 165 lb (74.8 kg)   SpO2 98%   BMI 27.46 kg/m   Wt Readings from Last 3 Encounters:  08/23/21 165 lb  (74.8 kg)  08/20/21 166 lb (75.3 kg)  06/07/21 166 lb (75.3 kg)    Physical Exam Vitals and nursing note reviewed.  Constitutional:      General: She is not in acute distress.    Appearance: She is well-developed. She is not diaphoretic.  Eyes:     Conjunctiva/sclera: Conjunctivae normal.  Cardiovascular:     Rate and Rhythm: Normal rate and regular rhythm.     Heart sounds: Normal heart sounds. No murmur heard. Pulmonary:     Effort: Pulmonary effort is normal. No respiratory distress.     Breath sounds: Normal breath sounds. No wheezing.  Musculoskeletal:        General: No swelling, tenderness or deformity. Normal range of motion.     Lumbar back: Normal.     Right hip: Normal.     Left hip: Normal.     Right upper leg: Normal.     Left upper leg: Normal.     Right knee: Normal.     Left knee: Normal.  Skin:    General: Skin is warm and dry.     Findings: No rash.  Neurological:     Mental Status: She is alert and oriented to person, place, and time.     Coordination: Coordination normal.  Psychiatric:        Behavior: Behavior normal.       Assessment & Plan:   Problem List Items Addressed This Visit       Cardiovascular and Mediastinum   Essential hypertension - Primary   Relevant Medications   losartan (COZAAR) 100 MG tablet   hydrochlorothiazide (HYDRODIURIL) 12.5 MG tablet   Other Relevant Orders   BMP8+EGFR   TSH   CBC with Differential/Platelet   Other Visit Diagnoses     Left lumbar radiculopathy       Relevant Medications   predniSONE (DELTASONE) 20 MG tablet   Peripheral edema       Relevant Orders   BMP8+EGFR   TSH   CBC with Differential/Platelet       We will send for blood work, will start hydrochlorothiazide and double losartan. Give a short course of prednisone to help with the radiculopathy and see if improved. Follow up plan: Return if symptoms worsen or fail to improve, for 3 to 5-week blood pressure follow-up.  Counseling  provided for all of the vaccine components Orders Placed This Encounter  Procedures   BMP8+EGFR   TSH   CBC with Differential/Platelet    Caryl Pina, MD [place]  08/23/2021, 9:55 AM

## 2021-08-24 LAB — CBC WITH DIFFERENTIAL/PLATELET
Basophils Absolute: 0 10*3/uL (ref 0.0–0.2)
Basos: 1 %
EOS (ABSOLUTE): 0.2 10*3/uL (ref 0.0–0.4)
Eos: 2 %
Hematocrit: 39.3 % (ref 34.0–46.6)
Hemoglobin: 13.3 g/dL (ref 11.1–15.9)
Immature Grans (Abs): 0 10*3/uL (ref 0.0–0.1)
Immature Granulocytes: 0 %
Lymphocytes Absolute: 3 10*3/uL (ref 0.7–3.1)
Lymphs: 36 %
MCH: 30 pg (ref 26.6–33.0)
MCHC: 33.8 g/dL (ref 31.5–35.7)
MCV: 89 fL (ref 79–97)
Monocytes Absolute: 0.9 10*3/uL (ref 0.1–0.9)
Monocytes: 11 %
Neutrophils Absolute: 4.2 10*3/uL (ref 1.4–7.0)
Neutrophils: 50 %
Platelets: 244 10*3/uL (ref 150–450)
RBC: 4.44 x10E6/uL (ref 3.77–5.28)
RDW: 13.4 % (ref 11.7–15.4)
WBC: 8.4 10*3/uL (ref 3.4–10.8)

## 2021-08-24 LAB — BMP8+EGFR
BUN/Creatinine Ratio: 23 (ref 12–28)
BUN: 27 mg/dL (ref 8–27)
CO2: 23 mmol/L (ref 20–29)
Calcium: 9.8 mg/dL (ref 8.7–10.3)
Chloride: 105 mmol/L (ref 96–106)
Creatinine, Ser: 1.15 mg/dL — ABNORMAL HIGH (ref 0.57–1.00)
Glucose: 106 mg/dL — ABNORMAL HIGH (ref 70–99)
Potassium: 5 mmol/L (ref 3.5–5.2)
Sodium: 142 mmol/L (ref 134–144)
eGFR: 49 mL/min/{1.73_m2} — ABNORMAL LOW (ref >59)

## 2021-08-24 LAB — TSH: TSH: 0.588 u[IU]/mL (ref 0.450–4.500)

## 2021-09-07 ENCOUNTER — Telehealth: Payer: Self-pay | Admitting: Family Medicine

## 2021-09-07 ENCOUNTER — Encounter: Payer: Self-pay | Admitting: Family Medicine

## 2021-09-07 ENCOUNTER — Ambulatory Visit (INDEPENDENT_AMBULATORY_CARE_PROVIDER_SITE_OTHER): Payer: Medicare Other | Admitting: Family Medicine

## 2021-09-07 VITALS — BP 120/65 | HR 86 | Temp 97.5°F | Ht 65.0 in | Wt 164.6 lb

## 2021-09-07 DIAGNOSIS — R42 Dizziness and giddiness: Secondary | ICD-10-CM

## 2021-09-07 DIAGNOSIS — R0602 Shortness of breath: Secondary | ICD-10-CM

## 2021-09-07 DIAGNOSIS — I951 Orthostatic hypotension: Secondary | ICD-10-CM

## 2021-09-07 NOTE — Progress Notes (Signed)
 Subjective:  Patient ID: Stacy Henry, female    DOB: 08/11/1945  Age: 76 y.o. MRN: [id number]  CC: Dizziness   HPI Stacy Henry presents for dizziness starting after wellness visit. BP had been high. Saw Dr. Dettinger two weeks ago. Went on losartan 100 and added HCTZ. Also took a steroid for 5 days. Passed out on August 31. Got woozy and went over backwards when she stood up from pulling weeds. EMS checked. Vitals were good. Has had several occurrences since. Denies vertigo. Described as weak, lightheaded and dyspneic.  EKG done by EMS at the time of the first incident was reportedly normal. No radiation or chest pain.     09/07/2021    2:30 PM 09/07/2021    2:29 PM 08/23/2021    9:21 AM  Depression screen PHQ 2/9  Decreased Interest 0 0   Down, Depressed, Hopeless 0 0   PHQ - 2 Score 0 0   Altered sleeping   2  Tired, decreased energy   1  Change in appetite   1  Feeling bad or failure about yourself    0  Trouble concentrating   0  Moving slowly or fidgety/restless   0  Suicidal thoughts   0  Difficult doing work/chores   Not difficult at all    History Stacy Henry has a past medical history of Allergy, GERD (gastroesophageal reflux disease), HTN (hypertension), Hyperlipidemia, Insomnia, Ischemic colitis (HCC) (2011), and Osteopenia.   She has a past surgical history that includes Appendectomy (as teenager); Breast surgery (Bilateral); Colonoscopy (2006); Colonoscopy (2012); Colonoscopy (N/A, 12/03/2014); Eye surgery (Bilateral); Finger surgery; Breast biopsy (Bilateral); and Breast excisional biopsy (Right).   Her family history includes Alcohol abuse in her brother and sister; Alcoholism in her brother, brother, and sister; Aneurysm in her brother; Atrial fibrillation in her mother and sister; Cirrhosis in her brother; Diabetes in her brother and brother; Heart attack (age of onset: 35) in her brother; Heart attack (age of onset: 69) in her father; Hypertension in her  mother; Kidney disease in her sister; Macular degeneration in her mother.She reports that she has never smoked. She has never used smokeless tobacco. She reports current alcohol use. She reports that she does not use drugs.    ROS Review of Systems  Constitutional: Negative.   HENT: Negative.    Eyes:  Negative for visual disturbance.  Respiratory:  Negative for shortness of breath.   Cardiovascular:  Negative for chest pain.  Gastrointestinal:  Negative for abdominal pain.  Musculoskeletal:  Negative for arthralgias.    Objective:  BP 120/65   Pulse 86   Temp (!) 97.5 F (36.4 C)   Ht 5' 5" (1.651 m)   Wt 164 lb 9.6 oz (74.7 kg)   SpO2 95%   BMI 27.39 kg/m   BP Readings from Last 3 Encounters:  09/07/21 120/65  08/23/21 (!) 175/78  06/07/21 (!) 156/80    Wt Readings from Last 3 Encounters:  09/07/21 164 lb 9.6 oz (74.7 kg)  08/23/21 165 lb (74.8 kg)  08/20/21 166 lb (75.3 kg)     Physical Exam Constitutional:      General: She is not in acute distress.    Appearance: She is well-developed.  Cardiovascular:     Rate and Rhythm: Normal rate and regular rhythm.  Pulmonary:     Breath sounds: Normal breath sounds.  Musculoskeletal:        General: Normal range of motion.  Skin:      General: Skin is warm and dry.  Neurological:     Mental Status: She is alert and oriented to person, place, and time.       Assessment & Plan:   Stacy Henry was seen today for dizziness.  Diagnoses and all orders for this visit:  Shortness of breath -     Cancel: EKG 12-Lead -     CBC with Differential/Platelet -     CMP14+EGFR  Orthostasis  Dizziness       I have discontinued Stacy Henry "Stacy Henry"'s hydrochlorothiazide. [name] maintain her Calcium Carbonate-Vit D-Min (CALCIUM 1200 PO), multivitamin, Probiotic Product (PROBIOTIC PO), fluticasone, COLLAGEN PO, pantoprazole, valACYclovir, losartan, and predniSONE.  Allergies as of 09/07/2021        Reactions   Sulfa Antibiotics         Medication List        Accurate as of September 07, 2021  7:43 PM. If you have any questions, ask your nurse or doctor.          STOP taking these medications    hydrochlorothiazide 12.5 MG tablet Commonly known as: HYDRODIURIL Stopped by:  , MD       TAKE these medications    CALCIUM 1200 PO Take by mouth. One a day   COLLAGEN PO Take by mouth.   fluticasone 50 MCG/ACT nasal spray Commonly known as: FLONASE Place 2 sprays into both nostrils daily.   losartan 100 MG tablet Commonly known as: COZAAR Take 1 tablet (100 mg total) by mouth daily.   multivitamin tablet Take 1 tablet by mouth daily.   pantoprazole 40 MG tablet Commonly known as: PROTONIX Take 1 tablet (40 mg total) by mouth daily.   predniSONE 20 MG tablet Commonly known as: DELTASONE 2 po at same time daily for 5 days   PROBIOTIC PO Take by mouth.   valACYclovir 1000 MG tablet Commonly known as: VALTREX 2gm twice a day for 24h for symptoms         Follow-up: Return in about 2 weeks (around 09/21/2021) for hypertension, orthostasis, with Dr. Dettinger.   , M.D. 

## 2021-09-07 NOTE — Telephone Encounter (Signed)
PT needs to be seen in office today.

## 2021-09-07 NOTE — Telephone Encounter (Signed)
Appointment scheduled.

## 2021-09-08 LAB — CBC WITH DIFFERENTIAL/PLATELET
Basophils Absolute: 0.1 10*3/uL (ref 0.0–0.2)
Basos: 1 %
EOS (ABSOLUTE): 0.3 10*3/uL (ref 0.0–0.4)
Eos: 3 %
Hematocrit: 40.9 % (ref 34.0–46.6)
Hemoglobin: 13.7 g/dL (ref 11.1–15.9)
Immature Grans (Abs): 0 10*3/uL (ref 0.0–0.1)
Immature Granulocytes: 0 %
Lymphocytes Absolute: 3.4 10*3/uL — ABNORMAL HIGH (ref 0.7–3.1)
Lymphs: 35 %
MCH: 29.8 pg (ref 26.6–33.0)
MCHC: 33.5 g/dL (ref 31.5–35.7)
MCV: 89 fL (ref 79–97)
Monocytes Absolute: 1.3 10*3/uL — ABNORMAL HIGH (ref 0.1–0.9)
Monocytes: 14 %
Neutrophils Absolute: 4.5 10*3/uL (ref 1.4–7.0)
Neutrophils: 47 %
Platelets: 290 10*3/uL (ref 150–450)
RBC: 4.6 x10E6/uL (ref 3.77–5.28)
RDW: 13.4 % (ref 11.7–15.4)
WBC: 9.6 10*3/uL (ref 3.4–10.8)

## 2021-09-08 LAB — CMP14+EGFR
ALT: 19 IU/L (ref 0–32)
AST: 21 IU/L (ref 0–40)
Albumin/Globulin Ratio: 1.4 (ref 1.2–2.2)
Albumin: 4.2 g/dL (ref 3.8–4.8)
Alkaline Phosphatase: 78 IU/L (ref 44–121)
BUN/Creatinine Ratio: 18 (ref 12–28)
BUN: 23 mg/dL (ref 8–27)
Bilirubin Total: 0.3 mg/dL (ref 0.0–1.2)
CO2: 25 mmol/L (ref 20–29)
Calcium: 9 mg/dL (ref 8.7–10.3)
Chloride: 106 mmol/L (ref 96–106)
Creatinine, Ser: 1.29 mg/dL — ABNORMAL HIGH (ref 0.57–1.00)
Globulin, Total: 3 g/dL (ref 1.5–4.5)
Glucose: 95 mg/dL (ref 70–99)
Potassium: 4.8 mmol/L (ref 3.5–5.2)
Sodium: 144 mmol/L (ref 134–144)
Total Protein: 7.2 g/dL (ref 6.0–8.5)
eGFR: 43 mL/min/{1.73_m2} — ABNORMAL LOW (ref >59)

## 2021-09-09 NOTE — Progress Notes (Signed)
Hello Lory,  Your lab result is normal and/or stable.Some minor variations that are not significant are commonly marked abnormal, but do not represent any medical problem for you.  Best regards, Claretta Fraise, M.D.

## 2021-09-23 ENCOUNTER — Encounter: Payer: Self-pay | Admitting: Family Medicine

## 2021-09-23 ENCOUNTER — Ambulatory Visit (INDEPENDENT_AMBULATORY_CARE_PROVIDER_SITE_OTHER): Payer: Medicare Other | Admitting: Family Medicine

## 2021-09-23 VITALS — BP 140/74 | HR 80 | Temp 97.2°F | Ht 62.0 in | Wt 169.0 lb

## 2021-09-23 DIAGNOSIS — I10 Essential (primary) hypertension: Secondary | ICD-10-CM

## 2021-09-23 DIAGNOSIS — Z23 Encounter for immunization: Secondary | ICD-10-CM

## 2021-09-23 DIAGNOSIS — E782 Mixed hyperlipidemia: Secondary | ICD-10-CM

## 2021-09-23 DIAGNOSIS — N1832 Chronic kidney disease, stage 3b: Secondary | ICD-10-CM | POA: Diagnosis not present

## 2021-09-23 DIAGNOSIS — Z1231 Encounter for screening mammogram for malignant neoplasm of breast: Secondary | ICD-10-CM

## 2021-09-23 DIAGNOSIS — I129 Hypertensive chronic kidney disease with stage 1 through stage 4 chronic kidney disease, or unspecified chronic kidney disease: Secondary | ICD-10-CM

## 2021-09-23 NOTE — Progress Notes (Signed)
BP (!) 140/74   Pulse 80   Temp (!) 97.2 F (36.2 C)   Ht '5\' 2"'$  (1.575 m)   Wt 169 lb (76.7 kg)   SpO2 96%   BMI 30.91 kg/m    Subjective:   Patient ID: Stacy Henry, female    DOB: [birth year], 76 y.o.   MRN: [id number]  HPI: Stacy Henry is a 76 y.o. female presenting on 09/23/2021 for Medical Management of Chronic Issues and Hypertension   HPI Hypertension Patient is currently on losartan, stop the hydrochlorothiazide because she was having lightheadedness dizziness and had a syncopal episode and low blood pressures.  She has not had any of those episodes since stopping it, and their blood pressure today is 140/74. Patient denies any lightheadedness or dizziness. Patient denies headaches, blurred vision, chest pains, shortness of breath, or weakness. Denies any side effects from medication and is content with current medication.   Hyperlipidemia Patient is coming in for recheck of his hyperlipidemia. The patient is currently taking no medication currently, trying diet control.. They deny any issues with myalgias or history of liver damage from it. They deny any focal numbness or weakness or chest pain.   Patient has CKD stage III and has been stable, blood work looks good.  Relevant past medical, surgical, family and social history reviewed and updated as indicated. Interim medical history since our last visit reviewed. Allergies and medications reviewed and updated.  Review of Systems  Constitutional:  Negative for chills and fever.  Eyes:  Negative for visual disturbance.  Respiratory:  Negative for chest tightness and shortness of breath.   Cardiovascular:  Negative for chest pain and leg swelling.  Musculoskeletal:  Negative for back pain and gait problem.  Skin:  Negative for rash.  Neurological:  Negative for dizziness, weakness, light-headedness and headaches.  Psychiatric/Behavioral:  Negative for agitation and behavioral problems.   All other systems  reviewed and are negative.   Per HPI unless specifically indicated above   Allergies as of 09/23/2021       Reactions   Sulfa Antibiotics         Medication List        Accurate as of September 23, 2021 10:43 AM. If you have any questions, ask your nurse or doctor.          STOP taking these medications    predniSONE 20 MG tablet Commonly known as: DELTASONE Stopped by: Fransisca Kaufmann Lianna Sitzmann, MD       TAKE these medications    CALCIUM 1200 PO Take by mouth. One a day   COLLAGEN PO Take by mouth.   fluticasone 50 MCG/ACT nasal spray Commonly known as: FLONASE Place 2 sprays into both nostrils daily.   losartan 100 MG tablet Commonly known as: COZAAR Take 1 tablet (100 mg total) by mouth daily.   multivitamin tablet Take 1 tablet by mouth daily.   pantoprazole 40 MG tablet Commonly known as: PROTONIX Take 1 tablet (40 mg total) by mouth daily.   PROBIOTIC PO Take by mouth.   valACYclovir 1000 MG tablet Commonly known as: VALTREX 2gm twice a day for 24h for symptoms         Objective:   BP (!) 140/74   Pulse 80   Temp (!) 97.2 F (36.2 C)   Ht '5\' 2"'$  (1.575 m)   Wt 169 lb (76.7 kg)   SpO2 96%   BMI 30.91 kg/m   Wt Readings from Last 3 Encounters:  09/23/21 169 lb (76.7 kg)  09/07/21 164 lb 9.6 oz (74.7 kg)  08/23/21 165 lb (74.8 kg)    Physical Exam Vitals and nursing note reviewed.  Constitutional:      General: She is not in acute distress.    Appearance: She is well-developed. She is not diaphoretic.  Eyes:     Conjunctiva/sclera: Conjunctivae normal.  Cardiovascular:     Rate and Rhythm: Normal rate and regular rhythm.     Heart sounds: Normal heart sounds. No murmur heard. Pulmonary:     Effort: Pulmonary effort is normal. No respiratory distress.     Breath sounds: Normal breath sounds. No wheezing.  Musculoskeletal:        General: No swelling or tenderness. Normal range of motion.  Skin:    General: Skin is warm and  dry.     Findings: No rash.  Neurological:     Mental Status: She is alert and oriented to person, place, and time.     Coordination: Coordination normal.  Psychiatric:        Behavior: Behavior normal.       Assessment & Plan:   Problem List Items Addressed This Visit       Cardiovascular and Mediastinum   Essential hypertension     Genitourinary   Chronic kidney disease (CKD) stage G3b/A1, moderately decreased glomerular filtration rate (GFR) between 30-44 mL/min/1.73 square meter and albuminuria creatinine ratio less than 30 mg/g (HCC)   Chronic kidney disease due to hypertension     Other   Hyperlipidemia   Other Visit Diagnoses     Encounter for screening mammogram for malignant neoplasm of breast    -  Primary   Relevant Orders   MM 3D SCREEN BREAST BILATERAL       Blood work from a week ago looks good, blood pressure looks good today, slightly on the higher side but that is better than having syncopal episodes.  We are going to allow permissive hypertension. Follow up plan: Return in about 3 months (around 12/23/2021), or if symptoms worsen or fail to improve, for Hypertension recheck.  Counseling provided for all of the vaccine components Orders Placed This Encounter  Procedures   MM 3D [place] Hydee Fleece, MD [facility] 09/23/2021, 10:43 AM

## 2021-11-04 DIAGNOSIS — R350 Frequency of micturition: Secondary | ICD-10-CM | POA: Diagnosis not present

## 2021-11-04 DIAGNOSIS — R300 Dysuria: Secondary | ICD-10-CM | POA: Diagnosis not present

## 2021-11-04 DIAGNOSIS — N343 Urethral syndrome, unspecified: Secondary | ICD-10-CM | POA: Diagnosis not present

## 2021-11-30 DIAGNOSIS — G43809 Other migraine, not intractable, without status migrainosus: Secondary | ICD-10-CM | POA: Diagnosis not present

## 2021-11-30 DIAGNOSIS — H43812 Vitreous degeneration, left eye: Secondary | ICD-10-CM | POA: Diagnosis not present

## 2021-12-02 DIAGNOSIS — L814 Other melanin hyperpigmentation: Secondary | ICD-10-CM | POA: Diagnosis not present

## 2021-12-02 DIAGNOSIS — L905 Scar conditions and fibrosis of skin: Secondary | ICD-10-CM | POA: Diagnosis not present

## 2021-12-02 DIAGNOSIS — L900 Lichen sclerosus et atrophicus: Secondary | ICD-10-CM | POA: Diagnosis not present

## 2021-12-02 DIAGNOSIS — L821 Other seborrheic keratosis: Secondary | ICD-10-CM | POA: Diagnosis not present

## 2021-12-02 DIAGNOSIS — L570 Actinic keratosis: Secondary | ICD-10-CM | POA: Diagnosis not present

## 2021-12-02 DIAGNOSIS — Z85828 Personal history of other malignant neoplasm of skin: Secondary | ICD-10-CM | POA: Diagnosis not present

## 2022-01-24 ENCOUNTER — Other Ambulatory Visit: Payer: Self-pay | Admitting: Family Medicine

## 2022-01-24 DIAGNOSIS — K219 Gastro-esophageal reflux disease without esophagitis: Secondary | ICD-10-CM

## 2022-02-23 DIAGNOSIS — M79605 Pain in left leg: Secondary | ICD-10-CM | POA: Diagnosis not present

## 2022-02-23 DIAGNOSIS — M79662 Pain in left lower leg: Secondary | ICD-10-CM | POA: Diagnosis not present

## 2022-02-23 DIAGNOSIS — I83892 Varicose veins of left lower extremities with other complications: Secondary | ICD-10-CM | POA: Diagnosis not present

## 2022-02-23 DIAGNOSIS — R600 Localized edema: Secondary | ICD-10-CM | POA: Diagnosis not present

## 2022-04-04 ENCOUNTER — Other Ambulatory Visit: Payer: Self-pay | Admitting: Family Medicine

## 2022-04-04 DIAGNOSIS — I10 Essential (primary) hypertension: Secondary | ICD-10-CM

## 2022-05-02 ENCOUNTER — Other Ambulatory Visit: Payer: Self-pay | Admitting: Family Medicine

## 2022-05-02 DIAGNOSIS — I10 Essential (primary) hypertension: Secondary | ICD-10-CM

## 2022-05-02 MED ORDER — LOSARTAN POTASSIUM 100 MG PO TABS
100.0000 mg | ORAL_TABLET | Freq: Every day | ORAL | 2 refills | Status: DC
Start: 2022-05-02 — End: 2022-07-06

## 2022-05-02 NOTE — Telephone Encounter (Signed)
Dettinger pt NTBS 30-d given 04/04/22 

## 2022-05-02 NOTE — Telephone Encounter (Signed)
Pt scheduled for Dettinger next available 07/06/2022. Can she get a refill?

## 2022-05-02 NOTE — Addendum Note (Signed)
Addended by: Julious Payer D on: 05/02/2022 11:02 AM   Modules accepted: Orders

## 2022-05-16 DIAGNOSIS — Z961 Presence of intraocular lens: Secondary | ICD-10-CM | POA: Diagnosis not present

## 2022-05-16 DIAGNOSIS — H526 Other disorders of refraction: Secondary | ICD-10-CM | POA: Diagnosis not present

## 2022-05-16 DIAGNOSIS — G43809 Other migraine, not intractable, without status migrainosus: Secondary | ICD-10-CM | POA: Diagnosis not present

## 2022-05-16 DIAGNOSIS — H26491 Other secondary cataract, right eye: Secondary | ICD-10-CM | POA: Diagnosis not present

## 2022-05-16 DIAGNOSIS — H43813 Vitreous degeneration, bilateral: Secondary | ICD-10-CM | POA: Diagnosis not present

## 2022-05-16 DIAGNOSIS — H11003 Unspecified pterygium of eye, bilateral: Secondary | ICD-10-CM | POA: Diagnosis not present

## 2022-06-13 ENCOUNTER — Other Ambulatory Visit: Payer: Self-pay | Admitting: Family Medicine

## 2022-06-13 ENCOUNTER — Ambulatory Visit
Admission: RE | Admit: 2022-06-13 | Discharge: 2022-06-13 | Disposition: A | Discharge status: Home / Self Care | Payer: Medicare Other | Source: Ambulatory Visit | Attending: Family Medicine | Admitting: Family Medicine

## 2022-06-13 DIAGNOSIS — Z1231 Encounter for screening mammogram for malignant neoplasm of breast: Secondary | ICD-10-CM

## 2022-07-06 ENCOUNTER — Ambulatory Visit (INDEPENDENT_AMBULATORY_CARE_PROVIDER_SITE_OTHER): Payer: Medicare Other | Admitting: Family Medicine

## 2022-07-06 ENCOUNTER — Encounter: Payer: Self-pay | Admitting: Family Medicine

## 2022-07-06 ENCOUNTER — Other Ambulatory Visit: Payer: Self-pay | Admitting: Family Medicine

## 2022-07-06 ENCOUNTER — Ambulatory Visit (INDEPENDENT_AMBULATORY_CARE_PROVIDER_SITE_OTHER): Payer: Medicare Other

## 2022-07-06 VITALS — BP 132/81 | HR 79 | Ht 62.0 in | Wt 168.0 lb

## 2022-07-06 DIAGNOSIS — I10 Essential (primary) hypertension: Secondary | ICD-10-CM | POA: Diagnosis not present

## 2022-07-06 DIAGNOSIS — Z780 Asymptomatic menopausal state: Secondary | ICD-10-CM | POA: Diagnosis not present

## 2022-07-06 DIAGNOSIS — N1832 Chronic kidney disease, stage 3b: Secondary | ICD-10-CM | POA: Diagnosis not present

## 2022-07-06 DIAGNOSIS — K219 Gastro-esophageal reflux disease without esophagitis: Secondary | ICD-10-CM

## 2022-07-06 DIAGNOSIS — I129 Hypertensive chronic kidney disease with stage 1 through stage 4 chronic kidney disease, or unspecified chronic kidney disease: Secondary | ICD-10-CM

## 2022-07-06 DIAGNOSIS — M85851 Other specified disorders of bone density and structure, right thigh: Secondary | ICD-10-CM | POA: Diagnosis not present

## 2022-07-06 DIAGNOSIS — E782 Mixed hyperlipidemia: Secondary | ICD-10-CM | POA: Diagnosis not present

## 2022-07-06 DIAGNOSIS — M85852 Other specified disorders of bone density and structure, left thigh: Secondary | ICD-10-CM | POA: Diagnosis not present

## 2022-07-06 MED ORDER — LOSARTAN POTASSIUM 100 MG PO TABS: 100.0000 mg | ORAL_TABLET | Freq: Every day | ORAL | 3 refills | Status: DC

## 2022-07-06 MED ORDER — PANTOPRAZOLE SODIUM 40 MG PO TBEC: 40.0000 mg | DELAYED_RELEASE_TABLET | Freq: Every day | ORAL | 3 refills | Status: DC

## 2022-07-06 NOTE — Progress Notes (Signed)
BP 132/81   Pulse 79   Ht 5\' 2"  (1.575 m)   Wt 168 lb (76.2 kg)   SpO2 97%   BMI 30.73 kg/m    Subjective:   Patient ID: Stacy Henry, female    DOB: June 22, 1945, 77 y.o.   MRN: [id number]  HPI: Stacy Henry is a 77 y.o. female presenting on 07/06/2022 for Medical Management of Chronic Issues, Hypertension, and Hyperlipidemia   HPI Hypertension Patient is currently on losartan, and their blood pressure today is 132/81. Patient denies any lightheadedness or dizziness. Patient denies headaches, blurred vision, chest pains, shortness of breath, or weakness. Denies any side effects from medication and is content with current medication.   Hyperlipidemia Patient is coming in for recheck of his hyperlipidemia. The patient is currently taking no medicine currently, diet control. They deny any issues with myalgias or history of liver damage from it. They deny any focal numbness or weakness or chest pain.   GERD Patient is currently on panty is all.  She denies any major symptoms or abdominal pain or belching or burping. She denies any blood in her stool or lightheadedness or dizziness.   Relevant past medical, surgical, family and social history reviewed and updated as indicated. Interim medical history since our last visit reviewed. Allergies and medications reviewed and updated.  Review of Systems  Constitutional:  Negative for chills and fever.  Eyes:  Negative for redness and visual disturbance.  Respiratory:  Negative for chest tightness and shortness of breath.   Cardiovascular:  Negative for chest pain and leg swelling.  Genitourinary:  Negative for difficulty urinating and dysuria.  Musculoskeletal:  Negative for back pain and gait problem.  Skin:  Negative for rash.  Neurological:  Negative for dizziness, light-headedness and headaches.  Psychiatric/Behavioral:  Negative for agitation and behavioral problems.   All other systems reviewed and are negative.   Per  HPI unless specifically indicated above   Allergies as of 07/06/2022       Reactions   Sulfa Antibiotics         Medication List        Accurate as of July 06, 2022  2:38 PM. If you have any questions, ask your nurse or doctor.          STOP taking these medications    CALCIUM 1200 PO Stopped by: Nils Pyle, MD   COLLAGEN PO Stopped by: Elige Radon Latrisa Hellums, MD       TAKE these medications    fluticasone 50 MCG/ACT nasal spray Commonly known as: FLONASE Place 2 sprays into both nostrils daily.   losartan 100 MG tablet Commonly known as: COZAAR Take 1 tablet (100 mg total) by mouth daily.   multivitamin tablet Take 1 tablet by mouth daily.   pantoprazole 40 MG tablet Commonly known as: PROTONIX Take 1 tablet (40 mg total) by mouth daily.   PROBIOTIC PO Take by mouth.   valACYclovir 1000 MG tablet Commonly known as: VALTREX 2gm twice a day for 24h for symptoms         Objective:   BP 132/81   Pulse 79   Ht 5\' 2"  (1.575 m)   Wt 168 lb (76.2 kg)   SpO2 97%   BMI 30.73 kg/m   Wt Readings from Last 3 Encounters:  07/06/22 168 lb (76.2 kg)  09/23/21 169 lb (76.7 kg)  09/07/21 164 lb 9.6 oz (74.7 kg)    Physical Exam Vitals and nursing note reviewed.  Constitutional:      General: She is not in acute distress.    Appearance: She is well-developed. She is not diaphoretic.  Eyes:     Conjunctiva/sclera: Conjunctivae normal.  Cardiovascular:     Rate and Rhythm: Normal rate and regular rhythm.     Heart sounds: Normal heart sounds. No murmur heard. Pulmonary:     Effort: Pulmonary effort is normal. No respiratory distress.     Breath sounds: Normal breath sounds. No wheezing.  Musculoskeletal:        General: No swelling or tenderness. Normal range of motion.  Skin:    General: Skin is warm and dry.     Findings: No rash.  Neurological:     Mental Status: She is alert and oriented to person, place, and time.     Coordination:  Coordination normal.  Psychiatric:        Behavior: Behavior normal.       Assessment & Plan:   Problem List Items Addressed This Visit       Cardiovascular and Mediastinum   Essential hypertension   Relevant Medications   losartan (COZAAR) 100 MG tablet   Other Relevant Orders   CBC with Differential/Platelet   CMP14+EGFR   Lipid panel     Digestive   GERD   Relevant Medications   pantoprazole (PROTONIX) 40 MG tablet     Genitourinary   Chronic kidney disease (CKD) stage G3b/A1, moderately decreased glomerular filtration rate (GFR) between 30-44 mL/min/1.73 square meter and albuminuria creatinine ratio less than 30 mg/g (HCC) - Primary   Relevant Orders   CBC with Differential/Platelet   CMP14+EGFR   Lipid panel   Chronic kidney disease due to hypertension     Other   Hyperlipidemia   Relevant Medications   losartan (COZAAR) 100 MG tablet   Other Relevant Orders   CBC with Differential/Platelet   CMP14+EGFR   Lipid panel   Other Visit Diagnoses     Postmenopausal           Seems to be doing well, blood pressure well, follow-up in 6 months. Follow up plan: Return in about 6 months (around 01/06/2023), or if symptoms worsen or fail to improve, for Physical exam and hypertension and cholesterol.  Counseling provided for all of the vaccine components Orders Placed This Encounter  Procedures   CBC with Differential/Platelet   CMP14+EGFR   Lipid panel    Arville Care, MD [facility] 07/06/2022, 2:38 PM

## 2022-07-07 LAB — CBC WITH DIFFERENTIAL/PLATELET
Basophils Absolute: 0.1 10*3/uL (ref 0.0–0.2)
Basos: 1 %
EOS (ABSOLUTE): 0.3 10*3/uL (ref 0.0–0.4)
Eos: 4 %
Hematocrit: 41.5 % (ref 34.0–46.6)
Hemoglobin: 13.5 g/dL (ref 11.1–15.9)
Immature Grans (Abs): 0.1 10*3/uL (ref 0.0–0.1)
Immature Granulocytes: 1 %
Lymphocytes Absolute: 4.2 10*3/uL — ABNORMAL HIGH (ref 0.7–3.1)
Lymphs: 42 %
MCH: 28.8 pg (ref 26.6–33.0)
MCHC: 32.5 g/dL (ref 31.5–35.7)
MCV: 89 fL (ref 79–97)
Monocytes Absolute: 0.9 10*3/uL (ref 0.1–0.9)
Monocytes: 9 %
Neutrophils Absolute: 4.2 10*3/uL (ref 1.4–7.0)
Neutrophils: 43 %
Platelets: 256 10*3/uL (ref 150–450)
RBC: 4.69 x10E6/uL (ref 3.77–5.28)
RDW: 13.1 % (ref 11.7–15.4)
WBC: 9.7 10*3/uL (ref 3.4–10.8)

## 2022-07-07 LAB — LIPID PANEL
Chol/HDL Ratio: 4.3 ratio (ref 0.0–4.4)
Cholesterol, Total: 207 mg/dL — ABNORMAL HIGH (ref 100–199)
HDL: 48 mg/dL (ref >39)
LDL Chol Calc (NIH): 116 mg/dL — ABNORMAL HIGH (ref 0–99)
Triglycerides: 247 mg/dL — ABNORMAL HIGH (ref 0–149)
VLDL Cholesterol Cal: 43 mg/dL — ABNORMAL HIGH (ref 5–40)

## 2022-07-07 LAB — CMP14+EGFR
ALT: 17 IU/L (ref 0–32)
AST: 20 IU/L (ref 0–40)
Albumin: 4.3 g/dL (ref 3.8–4.8)
Alkaline Phosphatase: 77 IU/L (ref 44–121)
BUN/Creatinine Ratio: 22 (ref 12–28)
BUN: 25 mg/dL (ref 8–27)
Bilirubin Total: 0.3 mg/dL (ref 0.0–1.2)
CO2: 25 mmol/L (ref 20–29)
Calcium: 9.5 mg/dL (ref 8.7–10.3)
Chloride: 105 mmol/L (ref 96–106)
Creatinine, Ser: 1.14 mg/dL — ABNORMAL HIGH (ref 0.57–1.00)
Globulin, Total: 2.6 g/dL (ref 1.5–4.5)
Glucose: 138 mg/dL — ABNORMAL HIGH (ref 70–99)
Potassium: 5 mmol/L (ref 3.5–5.2)
Sodium: 144 mmol/L (ref 134–144)
Total Protein: 6.9 g/dL (ref 6.0–8.5)
eGFR: 49 mL/min/{1.73_m2} — ABNORMAL LOW (ref >59)

## 2022-07-14 ENCOUNTER — Other Ambulatory Visit: Payer: Self-pay

## 2022-07-14 MED ORDER — ROSUVASTATIN CALCIUM 5 MG PO TABS: 5.0000 mg | ORAL_TABLET | Freq: Every evening | ORAL | 1 refills | Status: DC

## 2022-08-30 ENCOUNTER — Ambulatory Visit (INDEPENDENT_AMBULATORY_CARE_PROVIDER_SITE_OTHER): Payer: Medicare Other | Admitting: Nurse Practitioner

## 2022-08-30 ENCOUNTER — Encounter: Payer: Self-pay | Admitting: Nurse Practitioner

## 2022-08-30 VITALS — BP 139/81 | HR 79 | Temp 97.4°F | Resp 20 | Ht 62.0 in | Wt 167.0 lb

## 2022-08-30 DIAGNOSIS — N3000 Acute cystitis without hematuria: Secondary | ICD-10-CM | POA: Diagnosis not present

## 2022-08-30 DIAGNOSIS — R300 Dysuria: Secondary | ICD-10-CM | POA: Diagnosis not present

## 2022-08-30 LAB — URINALYSIS, COMPLETE
Bilirubin, UA: NEGATIVE
Glucose, UA: NEGATIVE
Nitrite, UA: POSITIVE — AB
Protein,UA: NEGATIVE
RBC, UA: NEGATIVE
Specific Gravity, UA: 1.02 (ref 1.005–1.030)
Urobilinogen, Ur: 0.2 mg/dL (ref 0.2–1.0)
pH, UA: 5.5 (ref 5.0–7.5)

## 2022-08-30 LAB — MICROSCOPIC EXAMINATION
RBC, Urine: NONE SEEN /hpf (ref 0–2)
Renal Epithel, UA: NONE SEEN /hpf
Yeast, UA: NONE SEEN

## 2022-08-30 MED ORDER — CEPHALEXIN 500 MG PO CAPS
500.0000 mg | ORAL_CAPSULE | Freq: Two times a day (BID) | ORAL | 0 refills | Status: DC
Start: 2022-08-30 — End: 2022-09-16

## 2022-08-30 NOTE — Patient Instructions (Signed)
Urinary Tract Infection, Adult A urinary tract infection (UTI) is an infection of any part of the urinary tract. The urinary tract includes: The kidneys. The ureters. The bladder. The urethra. These organs make, store, and get rid of pee (urine) in the body. What are the causes? This infection is caused by germs (bacteria) in your genital area. These germs grow and cause swelling (inflammation) of your urinary tract. What increases the risk? The following factors may make you more likely to develop this condition: Using a small, thin tube (catheter) to drain pee. Not being able to control when you pee or poop (incontinence). Being female. If you are female, these things can increase the risk: Using these methods to prevent pregnancy: A medicine that kills sperm (spermicide). A device that blocks sperm (diaphragm). Having low levels of a female hormone (estrogen). Being pregnant. You are more likely to develop this condition if: You have genes that add to your risk. You are sexually active. You take antibiotic medicines. You have trouble peeing because of: A prostate that is bigger than normal, if you are female. A blockage in the part of your body that drains pee from the bladder. A kidney stone. A nerve condition that affects your bladder. Not getting enough to drink. Not peeing often enough. You have other conditions, such as: Diabetes. A weak disease-fighting system (immune system). Sickle cell disease. Gout. Injury of the spine. What are the signs or symptoms? Symptoms of this condition include: Needing to pee right away. Peeing small amounts often. Pain or burning when peeing. Blood in the pee. Pee that smells bad or not like normal. Trouble peeing. Pee that is cloudy. Fluid coming from the vagina, if you are female. Pain in the belly or lower back. Other symptoms include: Vomiting. Not feeling hungry. Feeling mixed up (confused). This may be the first symptom in  older adults. Being tired and grouchy (irritable). A fever. Watery poop (diarrhea). How is this treated? Taking antibiotic medicine. Taking other medicines. Drinking enough water. In some cases, you may need to see a specialist. Follow these instructions at home:  Medicines Take over-the-counter and prescription medicines only as told by your doctor. If you were prescribed an antibiotic medicine, take it as told by your doctor. Do not stop taking it even if you start to feel better. General instructions Make sure you: Pee until your bladder is empty. Do not hold pee for a long time. Empty your bladder after sex. Wipe from front to back after peeing or pooping if you are a female. Use each tissue one time when you wipe. Drink enough fluid to keep your pee pale yellow. Keep all follow-up visits. Contact a doctor if: You do not get better after 1-2 days. Your symptoms go away and then come back. Get help right away if: You have very bad back pain. You have very bad pain in your lower belly. You have a fever. You have chills. You feeling like you will vomit or you vomit. Summary A urinary tract infection (UTI) is an infection of any part of the urinary tract. This condition is caused by germs in your genital area. There are many risk factors for a UTI. Treatment includes antibiotic medicines. Drink enough fluid to keep your pee pale yellow. This information is not intended to replace advice given to you by your health care provider. Make sure you discuss any questions you have with your health care provider. Document Revised: 07/28/2019 Document Reviewed: 08/02/2019 Elsevier Patient Education    2024 Elsevier Inc.  

## 2022-08-30 NOTE — Progress Notes (Signed)
   Subjective:    Patient ID: Stacy Henry, female    DOB: [birth year], 77 y.o.   MRN: [id number]   Chief Complaint: Dysuria   Dysuria  This is a new problem. The current episode started 1 to 4 weeks ago. The problem occurs intermittently. The problem has been waxing and waning. The quality of the pain is described as burning. The pain is at a severity of 5/10. There has been no fever. She is Sexually active. There is No history of pyelonephritis. Associated symptoms include frequency, hesitancy and urgency. Pertinent negatives include no chills. Treatments tried: AZO.    Patient Active Problem List   Diagnosis Date Noted   Regular astigmatism of left eye 02/13/2020   Combined forms of age-related cataract of left eye 02/13/2020   Chronic kidney disease due to hypertension 07/26/2018   Insomnia due to stress 07/26/2018   Basal cell carcinoma (BCC) of cheek 04/05/2018   Chronic kidney disease (CKD) stage G3b/A1, moderately decreased glomerular filtration rate (GFR) between 30-44 mL/min/1.73 square meter and albuminuria creatinine ratio less than 30 mg/g (HCC) 01/25/2018   Essential hypertension 07/24/2017   Hyperlipidemia 07/24/2017   Recurrent epistaxis 11/28/2016   Lichen sclerosus et atrophicus 06/10/2016   Ischemic colitis (HCC) 11/04/2014    Class: Hospitalized for   POSTMENOPAUSAL SYNDROME 04/21/2009   Allergic rhinitis 07/02/2007   GERD 07/02/2007       Review of Systems  Constitutional:  Negative for chills and fever.  Genitourinary:  Positive for dysuria, frequency, hesitancy and urgency.       Objective:   Physical Exam Vitals reviewed.  Constitutional:      Appearance: Normal appearance.  Cardiovascular:     Rate and Rhythm: Normal rate and regular rhythm.     Heart sounds: Normal heart sounds.  Pulmonary:     Effort: Pulmonary effort is normal.     Breath sounds: Normal breath sounds.  Skin:    General: Skin is warm.  Neurological:     General: No  focal deficit present.     Mental Status: She is alert and oriented to person, place, and time.  Psychiatric:        Mood and Affect: Mood normal.        Behavior: Behavior normal.    BP 139/81   Pulse 79   Temp (!) 97.4 F (36.3 C) (Temporal)   Resp 20   Ht 5\' 2"  (1.575 m)   Wt 167 lb (75.8 kg)   SpO2 98%   BMI 30.54 kg/m         Assessment & Plan:   [name] in today with chief complaint of Dysuria   1. Dysuria - Urinalysis, Complete - Urine Culture  2. Acute cystitis without hematuria Take medication as prescribe Cotton underwear Take shower not bath Cranberry juice, yogurt Force fluids AZO over the counter X2 days Culture pending RTO prn  - cephALEXin (KEFLEX) 500 MG capsule; Take 1 capsule (500 mg total) by mouth 2 (two) times daily.  Dispense: 14 capsule; Refill: 0    The above assessment and management plan was discussed with the patient. The patient verbalized understanding of and has agreed to the management plan. Patient is aware to call the clinic if symptoms persist or worsen. Patient is aware when to return to the clinic for a follow-up visit. Patient educated on when it is appropriate to go to the emergency department.   Mary-Margaret Daphine Deutscher, FNP

## 2022-08-31 LAB — URINE CULTURE

## 2022-09-16 ENCOUNTER — Telehealth: Payer: Self-pay | Admitting: Family Medicine

## 2022-09-16 ENCOUNTER — Ambulatory Visit (INDEPENDENT_AMBULATORY_CARE_PROVIDER_SITE_OTHER): Payer: Medicare Other | Admitting: Family Medicine

## 2022-09-16 ENCOUNTER — Telehealth: Payer: Medicare Other | Admitting: Physician Assistant

## 2022-09-16 ENCOUNTER — Encounter: Payer: Self-pay | Admitting: Family Medicine

## 2022-09-16 VITALS — BP 144/74 | HR 84 | Ht 62.0 in | Wt 166.0 lb

## 2022-09-16 DIAGNOSIS — R3989 Other symptoms and signs involving the genitourinary system: Secondary | ICD-10-CM | POA: Diagnosis not present

## 2022-09-16 DIAGNOSIS — S41111A Laceration without foreign body of right upper arm, initial encounter: Secondary | ICD-10-CM

## 2022-09-16 DIAGNOSIS — Z23 Encounter for immunization: Secondary | ICD-10-CM | POA: Diagnosis not present

## 2022-09-16 MED ORDER — CIPROFLOXACIN HCL 250 MG PO TABS
250.0000 mg | ORAL_TABLET | Freq: Two times a day (BID) | ORAL | 0 refills | Status: AC
Start: 2022-09-16 — End: 2022-09-19

## 2022-09-16 NOTE — Progress Notes (Signed)
BP (!) 144/74   Pulse 84   Ht 5\' 2"  (1.575 m)   Wt 166 lb (75.3 kg)   SpO2 96%   BMI 30.36 kg/m    Subjective:   Patient ID: Stacy Henry, female    DOB: 1945-05-28, 77 y.o.   MRN: [id number]  HPI: Stacy Henry is a 77 y.o. female presenting on 09/16/2022 for Laceration (Right forearm)   HPI Skin tear Patient has skin tear of her right arm just over her elbow on the lateral aspect.  She says she did not 2 days ago when she hit against the metal door frame and the biggest thing that she was concerned about is that she has not had her tetanus shot in quite some time.  She has been cleaning it with soap and water and putting Neosporin on it and then wrapping it to help it heal and it does feel like it is healing.  She denies any erythema or pain.  The biggest thing she is here today is for her tetanus.  Relevant past medical, surgical, family and social history reviewed and updated as indicated. Interim medical history since our last visit reviewed. Allergies and medications reviewed and updated.  Review of Systems  Constitutional:  Negative for chills and fever.  Eyes:  Negative for redness and visual disturbance.  Skin:  Positive for wound. Negative for rash.  Psychiatric/Behavioral:  Negative for agitation and behavioral problems.   All other systems reviewed and are negative.   Per HPI unless specifically indicated above   Allergies as of 09/16/2022       Reactions   Sulfa Antibiotics         Medication List        Accurate as of September 16, 2022  3:24 PM. If you have any questions, ask your nurse or doctor.          STOP taking these medications    cephALEXin 500 MG capsule Commonly known as: Keflex Stopped by: Elige Radon Tamey Wanek       TAKE these medications    ciprofloxacin 250 MG tablet Commonly known as: Cipro Take 1 tablet (250 mg total) by mouth 2 (two) times daily for 3 days. Started by: Margaretann Loveless   fluticasone 50  MCG/ACT nasal spray Commonly known as: FLONASE Place 2 sprays into both nostrils daily.   losartan 100 MG tablet Commonly known as: COZAAR Take 1 tablet (100 mg total) by mouth daily.   multivitamin tablet Take 1 tablet by mouth daily.   pantoprazole 40 MG tablet Commonly known as: PROTONIX Take 1 tablet (40 mg total) by mouth daily.   PROBIOTIC PO Take by mouth.   rosuvastatin 5 MG tablet Commonly known as: Crestor Take 1 tablet (5 mg total) by mouth at bedtime.   valACYclovir 1000 MG tablet Commonly known as: VALTREX 2gm twice a day for 24h for symptoms         Objective:   BP (!) 144/74   Pulse 84   Ht 5\' 2"  (1.575 m)   Wt 166 lb (75.3 kg)   SpO2 96%   BMI 30.36 kg/m   Wt Readings from Last 3 Encounters:  09/16/22 166 lb (75.3 kg)  08/30/22 167 lb (75.8 kg)  07/06/22 168 lb (76.2 kg)    Physical Exam Vitals and nursing note reviewed.  Constitutional:      Appearance: Normal appearance.  Skin:    Findings: Wound present.  Neurological:     Mental Status: She is alert.       Assessment & Plan:   Problem List Items Addressed This Visit   None Visit Diagnoses     Skin tear of right upper arm without complication, initial encounter    -  Primary       Will do tetanus shot today and we wrapped the wound with Neosporin and nonstick and [place] she can change it daily and wash with simple soap and water. Follow up plan: Return if symptoms worsen or fail to improve.  Counseling provided for all of the vaccine components No orders of the defined types were placed in this encounter.   Arville Care, MD [facility] 09/16/2022, 3:24 PM

## 2022-09-16 NOTE — Telephone Encounter (Signed)
Put her in the 255 and have her come around 245 and we will see her

## 2022-09-16 NOTE — Progress Notes (Signed)
E-Visit for Urinary Problems  We are sorry that you are not feeling well.  Here is how we plan to help!  Based on what you shared with me it looks like you most likely have a simple urinary tract infection.  A UTI (Urinary Tract Infection) is a bacterial infection of the bladder.  Most cases of urinary tract infections are simple to treat but a key part of your care is to encourage you to drink plenty of fluids and watch your symptoms carefully.  I have prescribed Cipro 250mg  Take 1 tablet twice daily for 3 days.  Your symptoms should gradually improve. Call us if the burning in your urine worsens, you develop worsening fever, back pain or pelvic pain or if your symptoms do not resolve after completing the antibiotic.  Urinary tract infections can be prevented by drinking plenty of water to keep your body hydrated.  Also be sure when you wipe, wipe from front to back and don't hold it in!  If possible, empty your bladder every 4 hours.  HOME CARE Drink plenty of fluids Compete the full course of the antibiotics even if the symptoms resolve Remember, when you need to go.go. Holding in your urine can increase the likelihood of getting a UTI! GET HELP RIGHT AWAY IF: You cannot urinate You get a high fever Worsening back pain occurs You see blood in your urine You feel sick to your stomach or throw up You feel like you are going to pass out  MAKE SURE YOU  Understand these instructions. Will watch your condition. Will get help right away if you are not doing well or get worse.   Thank you for choosing an e-visit.  Your e-visit answers were reviewed by a board certified advanced clinical practitioner to complete your personal care plan. Depending upon the condition, your plan could have included both over the counter or prescription medications.  Please review your pharmacy choice. Make sure the pharmacy is open so you can pick up prescription now. If there is a problem, you may  contact your provider through [employer] and have the prescription routed to another pharmacy.  Your safety is important to [place]. If you have drug allergies check your prescription carefully.   For the next 24 hours you can use MyChart to ask questions about today's visit, request a non-urgent call back, or ask for a work or school excuse. You will get an email in the next two days asking about your experience. I hope that your e-visit has been valuable and will speed your recovery.  I have spent 5 minutes in review of e-visit questionnaire, review and updating patient chart, medical decision making and response to patient.   Margaretann Loveless, PA-C

## 2022-09-16 NOTE — Telephone Encounter (Signed)
Pt called requesting to be seen today. She just scraped her arm with a metal door hinge. Is past due for a tetanus shot and needs to get one.   No openings. Wants to be worked in. Please advise.

## 2022-09-16 NOTE — Telephone Encounter (Signed)
Patient called in and will come at 2:45 per Dr. Darrol Poke note

## 2022-09-16 NOTE — Addendum Note (Signed)
Addended by: Dorene Sorrow on: 09/16/2022 04:24 PM   Modules accepted: Orders

## 2022-10-12 ENCOUNTER — Ambulatory Visit: Payer: Medicare Other

## 2022-10-12 VITALS — Ht 65.0 in | Wt 160.0 lb

## 2022-10-12 DIAGNOSIS — Z0000 Encounter for general adult medical examination without abnormal findings: Secondary | ICD-10-CM | POA: Diagnosis not present

## 2022-10-12 NOTE — Patient Instructions (Signed)
Ms. Jeanmarie , Thank you for taking time to come for your Medicare Wellness Visit. I appreciate your ongoing commitment to your health goals. Please review the following plan we discussed and let me know if I can assist you in the future.   Referrals/Orders/Follow-Ups/Clinician Recommendations: Aim for 30 minutes of exercise or brisk walking, 6-8 glasses of water, and 5 servings of fruits and vegetables each day.   This is a list of the screening recommended for you and due dates:  Health Maintenance  Topic Date Due   Zoster (Shingles) Vaccine (1 of 2) 03/03/1964   COVID-19 Vaccine (4 - 2023-24 season) 09/04/2022   Flu Shot  04/03/2023*   Mammogram  06/13/2023   Medicare Annual Wellness Visit  10/12/2023   DEXA scan (bone density measurement)  07/05/2024   DTaP/Tdap/Td vaccine (3 - Td or Tdap) 09/15/2032   Pneumonia Vaccine  Completed   Hepatitis C Screening  Completed   HPV Vaccine  Aged Out   Colon Cancer Screening  Discontinued  *Topic was postponed. The date shown is not the original due date.    Advanced directives: (Provided) Advance directive discussed with you today. I have provided a copy for you to complete at home and have notarized. Once this is complete, please bring a copy in to our office so we can scan it into your chart. Information on Advanced Care Planning can be found at [facility] Advance Health Care Directives Advance Health Care Directives ([email or web address]    Next Medicare Annual Wellness Visit scheduled for next year: Yes  Insert Preventive Care attachment Insert FALL PREVENTION attachment if needed

## 2022-10-12 NOTE — Progress Notes (Signed)
Subjective:   Stacy Henry is a 77 y.o. female who presents for Medicare Annual (Subsequent) preventive examination.  Visit Complete: Virtual I connected with  [name] on 10/12/22 by a audio enabled telemedicine application and verified that I am speaking with the correct person using two identifiers.  Patient Location: Home  Provider Location: Home Office  I discussed the limitations of evaluation and management by telemedicine. The patient expressed understanding and agreed to proceed.  Vital Signs: Because this visit was a virtual/telehealth visit, some criteria may be missing or patient reported. Any vitals not documented were not able to be obtained and vitals that have been documented are patient reported.  Patient Medicare AWV questionnaire was completed by the patient on 10/12/2022; I have confirmed that all information answered by patient is correct and no changes since this date.  Cardiac Risk Factors include: advanced age (>37men, >26 women)     Objective:    Today's Vitals   10/12/22 1310  Weight: 160 lb (72.6 kg)  Height: 5\' 5"  (1.651 m)   Body mass index is 26.63 kg/m.     10/12/2022    1:13 PM 08/20/2021    2:25 PM 07/17/2020   10:34 AM 10/30/2018    9:58 AM 10/24/2017   11:59 AM 11/01/2016   11:53 PM 10/20/2015    2:38 PM  Advanced Directives  Does Patient Have a Medical Advance Directive? No No No No No No No  Would patient like information on creating a medical advance directive? Yes (MAU/Ambulatory/Procedural Areas - Information given) No - Patient declined No - Patient declined No - Patient declined Yes (MAU/Ambulatory/Procedural Areas - Information given)  Yes - Educational materials given    Current Medications (verified) Outpatient Encounter Medications as of 10/12/2022  Medication Sig   fluticasone (FLONASE) 50 MCG/ACT nasal spray Place 2 sprays into both nostrils daily.   losartan (COZAAR) 100 MG tablet Take 1 tablet (100 mg  total) by mouth daily.   Multiple Vitamin (MULTIVITAMIN) tablet Take 1 tablet by mouth daily.   pantoprazole (PROTONIX) 40 MG tablet Take 1 tablet (40 mg total) by mouth daily.   Probiotic Product (PROBIOTIC PO) Take by mouth.   rosuvastatin (CRESTOR) 5 MG tablet Take 1 tablet (5 mg total) by mouth at bedtime.   valACYclovir (VALTREX) 1000 MG tablet 2gm twice a day for 24h for symptoms   No facility-administered encounter medications on file as of 10/12/2022.    Allergies (verified) Sulfa antibiotics   History: Past Medical History:  Diagnosis Date   Allergy    GERD (gastroesophageal reflux disease)    HTN (hypertension)    Hyperlipidemia    Insomnia    Ischemic colitis (HCC) 2011   Osteopenia    Past Surgical History:  Procedure Laterality Date   APPENDECTOMY  as teenager   BREAST BIOPSY Bilateral    years ago benign   BREAST EXCISIONAL BIOPSY Right    years ago benign   BREAST SURGERY Bilateral    "lump"removal   COLONOSCOPY  2006   RMR: normal.   COLONOSCOPY  2012   [facility]: most c/w ischemic colitis, no path   COLONOSCOPY N/A 12/03/2014   Procedure: COLONOSCOPY;  Surgeon: Corbin Ade, MD;  Location: [facility];  Service: Endoscopy;  Laterality: N/A;  0900   EYE SURGERY Bilateral    FINGER SURGERY     Family History  Problem Relation Age of Onset   Alcohol abuse Sister    Alcoholism  Sister    Diabetes Brother    Heart attack Brother 35   Aneurysm Brother    Alcoholism Brother    Diabetes Brother    Cirrhosis Brother    Alcohol abuse Brother    Alcoholism Brother    Hypertension Mother    Atrial fibrillation Mother    Macular degeneration Mother    Heart attack Father 6   Kidney disease Sister    Atrial fibrillation Sister    Colon cancer Neg Hx    Social History   Socioeconomic History   Marital status: Married    Spouse name: Marcy Salvo   Number of children: 1   Years of education: 13   Highest education level: Some  college, no degree  Occupational History   Occupation: Technical sales engineer  Tobacco Use   Smoking status: Never   Smokeless tobacco: Never  Vaping Use   Vaping status: Never Used  Substance and Sexual Activity   Alcohol use: Yes    Comment: ocassional wine - less than weekly   Drug use: No   Sexual activity: Yes    Partners: Male    Birth control/protection: Post-menopausal  Other Topics Concern   Not on file  Social History Narrative   Not on file   Social Determinants of Health   Financial Resource Strain: Low Risk  (10/12/2022)   Overall Financial Resource Strain (CARDIA)    Difficulty of Paying Living Expenses: Not hard at all  Food Insecurity: No Food Insecurity (10/12/2022)   Hunger Vital Sign    Worried About Running Out of Food in the Last Year: Never true    Ran Out of Food in the Last Year: Never true  Transportation Needs: No Transportation Needs (10/12/2022)   PRAPARE - Administrator, Civil Service (Medical): No    Lack of Transportation (Non-Medical): No  Physical Activity: Insufficiently Active (10/12/2022)   Exercise Vital Sign    Days of Exercise per Week: 3 days    Minutes of Exercise per Session: 30 min  Stress: No Stress Concern Present (10/12/2022)   [employer] of Occupational Health - Occupational Stress Questionnaire    Feeling of Stress : Not at all  Social Connections: Socially Integrated (10/12/2022)   Social Connection and Isolation Panel [NHANES]    Frequency of Communication with Friends and Family: More than three times a week    Frequency of Social Gatherings with Friends and Family: More than three times a week    Attends Religious Services: More than 4 times per year    Active Member of [employer] or Organizations: Yes    Attends Banker Meetings: 1 to 4 times per year    Marital Status: Married    Tobacco Counseling Counseling given: Not Answered   Clinical Intake:  Pre-visit preparation completed:  Yes  Pain : No/denies pain     Nutritional Risks: None Diabetes: No  How often do you need to have someone help you when you read instructions, pamphlets, or other written materials from your doctor or pharmacy?: 1 - Never  Interpreter Needed?: No  Information entered by :: Renie Ora, LPN   Activities of Daily Living    10/12/2022    1:13 PM  In your present state of health, do you have any difficulty performing the following activities:  Hearing? 0  Vision? 0  Difficulty concentrating or making decisions? 0  Walking or climbing stairs? 0  Dressing or bathing? 0  Doing errands, shopping? 0  Preparing Food and eating ? N  Using the Toilet? N  In the past six months, have you accidently leaked urine? N  Do you have problems with loss of bowel control? N  Managing your Medications? N  Managing your Finances? N  Housekeeping or managing your Housekeeping? N    Patient Care Team: Dettinger, Elige Radon, MD as PCP - General (Family Medicine) Kathleene Hazel, MD as PCP - Cardiology (Cardiology) Dettinger, Elige Radon, MD as PCP - Family Medicine (Family Medicine) Cherlyn Roberts, MD as Consulting Physician (Dermatology) Jena Gauss Gerrit Friends, MD as Consulting Physician (Gastroenterology)  Indicate any recent Medical Services you may have received from other than [facility] providers in the past year (date may be approximate).     Assessment:   This is a routine wellness examination for Wilhelmine.  Hearing/Vision screen Vision Screening - Comments:: Wears rx glasses - up to date with routine eye exams with  Dr Deitra Mayo    Goals Addressed             This Visit's Progress    DIET - EAT MORE FRUITS AND VEGETABLES         Depression Screen    10/12/2022    1:11 PM 09/16/2022    3:14 PM 08/30/2022   10:19 AM 07/06/2022    2:00 PM 09/23/2021   10:01 AM 09/07/2021    2:30 PM 09/07/2021    2:29 PM  PHQ 2/9 Scores  PHQ - 2 Score 0 0 0 0 0 0 0  PHQ- 9 Score 0 3 4  4        Fall Risk    10/12/2022    1:10 PM 09/16/2022    3:13 PM 08/30/2022   10:19 AM 07/06/2022    2:00 PM 09/23/2021   10:00 AM  Fall Risk   Falls in the past year? 0 0 0 0 1  Number falls in past yr: 0 0   0  Injury with Fall? 0 0   0  Risk for fall due to : No Fall Risks    Impaired balance/gait  Follow up Falls prevention discussed Falls evaluation completed   Falls evaluation completed    MEDICARE RISK AT HOME: Medicare Risk at Home Any stairs in or around the home?: Yes If so, are there any without handrails?: No Home free of loose throw rugs in walkways, pet beds, electrical cords, etc?: Yes Adequate lighting in your home to reduce risk of falls?: Yes Life alert?: No Use of a cane, walker or w/c?: No Grab bars in the bathroom?: Yes Shower chair or bench in shower?: Yes Elevated toilet seat or a handicapped toilet?: Yes  TIMED UP AND GO:  Was the test performed?  No    Cognitive Function:    10/24/2017   11:58 AM 10/19/2016   10:33 AM 10/20/2015   10:14 PM 12/10/2014   10:19 PM  MMSE - Mini Mental State Exam  Orientation to time 5 5 5 5   Orientation to Place 5 5 5 5   Registration 3 3 3 3   Attention/ Calculation 5 3 5 5   Recall 3 3 3 3   Language- name 2 objects 2 2 2 2   Language- repeat 1 1 1 1   Language- follow 3 step command 3 3 3 3   Language- read & follow direction 1 1 1 1   Write a sentence 1 1 1 1   Copy design 1 1 1 1   Total score 30 28 30  30  10/12/2022    1:13 PM 08/20/2021    2:26 PM 10/30/2018   10:01 AM  6CIT Screen  What Year? 0 points 0 points 0 points  What month? 0 points 0 points 0 points  What time? 0 points 0 points 0 points  Count back from 20 0 points 0 points 0 points  Months in reverse 0 points 0 points 0 points  Repeat phrase 0 points 0 points 0 points  Total Score 0 points 0 points 0 points    Immunizations Immunization History  Administered Date(s) Administered   Fluad Quad(high Dose 65+) 10/31/2018, 10/07/2019, 11/06/2020,  09/23/2021   Influenza, High Dose Seasonal PF 10/19/2016, 10/04/2017   Influenza,inj,Quad PF,6+ Mos 10/25/2012, 12/10/2014, 10/20/2015   Moderna Sars-Covid-2 Vaccination 02/07/2019, 03/08/2019, 01/14/2020   Pneumococcal Conjugate-13 12/10/2014   Pneumococcal Polysaccharide-23 04/21/2009   Td 01/03/2009   Tdap 09/16/2022   Zoster, Live 04/21/2009    TDAP status: Up to date  Flu Vaccine status: Due, Education has been provided regarding the importance of this vaccine. Advised may receive this vaccine at local pharmacy or Health Dept. Aware to provide a copy of the vaccination record if obtained from local pharmacy or Health Dept. Verbalized acceptance and understanding.  Pneumococcal vaccine status: Up to date  Covid-19 vaccine status: Completed vaccines  Qualifies for Shingles Vaccine? Yes   Zostavax completed No   Shingrix Completed?: No.    Education has been provided regarding the importance of this vaccine. Patient has been advised to call insurance company to determine out of pocket expense if they have not yet received this vaccine. Advised may also receive vaccine at local pharmacy or Health Dept. Verbalized acceptance and understanding.  Screening Tests Health Maintenance  Topic Date Due   Zoster Vaccines- Shingrix (1 of 2) 03/03/1964   COVID-19 Vaccine (4 - 2023-24 season) 09/04/2022   INFLUENZA VACCINE  04/03/2023 (Originally 08/04/2022)   MAMMOGRAM  06/13/2023   Medicare Annual Wellness (AWV)  10/12/2023   DEXA SCAN  07/05/2024   DTaP/Tdap/Td (3 - Td or Tdap) 09/15/2032   Pneumonia Vaccine 85+ Years old  Completed   Hepatitis C Screening  Completed   HPV VACCINES  Aged Out   Colonoscopy  Discontinued    Health Maintenance  Health Maintenance Due  Topic Date Due   Zoster Vaccines- Shingrix (1 of 2) 03/03/1964   COVID-19 Vaccine (4 - 2023-24 season) 09/04/2022    Colorectal cancer screening: No longer required.   Mammogram status: No longer required due to  age.  Bone Density status: Completed 06/06/2022. Results reflect: Bone density results: OSTEOPOROSIS. Repeat every 2 years.  Lung Cancer Screening: (Low Dose CT Chest recommended if Age 46-80 years, 20 pack-year currently smoking OR have quit w/in 15years.) does not qualify.   Lung Cancer Screening Referral: n/a  Additional Screening:  Hepatitis C Screening: does not qualify; Completed 05/10/2016  Vision Screening: Recommended annual ophthalmology exams for early detection of glaucoma and other disorders of the eye. Is the patient up to date with their annual eye exam?  Yes  Who is the provider or what is the name of the office in which the patient attends annual eye exams? Dr .Lorenda Hatchet  If pt is not established with a provider, would they like to be referred to a provider to establish care? No .   Dental Screening: Recommended annual dental exams for proper oral hygiene   Community Resource Referral / Chronic Care Management: CRR required this visit?  No   CCM required this  visit?  No     Plan:     I have personally reviewed and noted the following in the patient's chart:   Medical and social history Use of alcohol, tobacco or illicit drugs  Current medications and supplements including opioid prescriptions. Patient is not currently taking opioid prescriptions. Functional ability and status Nutritional status Physical activity Advanced directives List of other physicians Hospitalizations, surgeries, and ER visits in previous 12 months Vitals Screenings to include cognitive, depression, and falls Referrals and appointments  In addition, I have reviewed and discussed with patient certain preventive protocols, quality metrics, and best practice recommendations. A written personalized care plan for preventive services as well as general preventive health recommendations were provided to patient.     Lorrene Reid, LPN   16/01/958   After Visit Summary: (MyChart) Due  to this being a telephonic visit, the after visit summary with patients personalized plan was offered to patient via MyChart   Nurse Notes: none

## 2022-10-18 ENCOUNTER — Ambulatory Visit (INDEPENDENT_AMBULATORY_CARE_PROVIDER_SITE_OTHER): Payer: Medicare Other

## 2022-10-18 DIAGNOSIS — Z23 Encounter for immunization: Secondary | ICD-10-CM

## 2022-11-14 ENCOUNTER — Telehealth: Payer: Self-pay | Admitting: Family Medicine

## 2022-11-14 ENCOUNTER — Telehealth: Payer: Medicare Other | Admitting: Nurse Practitioner

## 2022-11-14 DIAGNOSIS — R300 Dysuria: Secondary | ICD-10-CM

## 2022-11-14 DIAGNOSIS — N390 Urinary tract infection, site not specified: Secondary | ICD-10-CM | POA: Diagnosis not present

## 2022-11-14 NOTE — Progress Notes (Signed)
After a review of your medical history , I feel your condition warrants further evaluation and I recommend that you be seen in a face to face visit.   We are unable to order lab testing, and you should have a urine culture sent out based on your symptoms and history.   We would recommend calling your primary care provider, and if you cannot be seen today- visiting a local Urgent Care (addresses below)  NOTE: There will be NO CHARGE for this eVisit   If you are having a true medical emergency please call 911.      For an urgent face to face visit, [place] has eight urgent care centers for your convenience:   NEW!! [facility] Health Urgent Care Center at [facility] Get Driving Directions [phone] [address] [place], [address]    [facility] Health Urgent Care Center at [facility] Get Driving Directions [phone] [address] [place], [place] [address]   [facility] Health Urgent Care Center [facility]) Get Driving Directions [phone] [address] [place], [place] [address]  [facility] Health Urgent Care Center [facility]) Get Driving Directions [phone] [address] [place],  [place]  [address]  [facility] Health Urgent Care Center [facility] - at [employer]  [phone] [address] [place],  [place] [address]   [facility] Health Urgent Care at [facility] Get Driving Directions [phone] [address] [place], [place] [address]   [facility] Health Urgent Care at [facility] Get Driving Directions  [phone] [address] [place], [place] [address]   [facility] Health Urgent Care at [facility] Directions [phone] [address], [address] [place], [place] [address]  Your MyChart E-visit questionnaire answers were reviewed by a board certified advanced clinical practitioner to complete your personal care plan based on  your specific symptoms.  Thank you for using e-Visits.

## 2022-11-14 NOTE — Telephone Encounter (Signed)
Copied from CRM [id number]. Topic: Clinical - Medication Question >> Nov 14, 2022  2:41 PM Whitney O wrote: Reason for CRM: patient calling about a uti and say she know this is the problem and is wanting to get medication keflex 500mg  for that issue . Patient say this has been a issue overtime . Patient is wanting to speak with dr or nurse . Callback number [phone]

## 2022-11-14 NOTE — Telephone Encounter (Signed)
Spoke with husband. He states that the pt has already been seen some where else and has received treatment. He was upset that it took 4 hours to get a response and does not understand why [facility] has a call center. Informed that we were sorry for the delay and we hope she feels better.

## 2022-12-05 DIAGNOSIS — L821 Other seborrheic keratosis: Secondary | ICD-10-CM | POA: Diagnosis not present

## 2022-12-05 DIAGNOSIS — Z85828 Personal history of other malignant neoplasm of skin: Secondary | ICD-10-CM | POA: Diagnosis not present

## 2022-12-05 DIAGNOSIS — L570 Actinic keratosis: Secondary | ICD-10-CM | POA: Diagnosis not present

## 2022-12-05 DIAGNOSIS — L814 Other melanin hyperpigmentation: Secondary | ICD-10-CM | POA: Diagnosis not present

## 2022-12-22 ENCOUNTER — Other Ambulatory Visit: Payer: Self-pay | Admitting: Family Medicine

## 2023-01-06 ENCOUNTER — Encounter: Payer: Self-pay | Admitting: Family Medicine

## 2023-01-06 ENCOUNTER — Ambulatory Visit (INDEPENDENT_AMBULATORY_CARE_PROVIDER_SITE_OTHER): Payer: Medicare Other | Admitting: Family Medicine

## 2023-01-06 VITALS — BP 152/82 | HR 80 | Ht 65.0 in | Wt 166.0 lb

## 2023-01-06 DIAGNOSIS — N1832 Chronic kidney disease, stage 3b: Secondary | ICD-10-CM

## 2023-01-06 DIAGNOSIS — E782 Mixed hyperlipidemia: Secondary | ICD-10-CM

## 2023-01-06 DIAGNOSIS — I10 Essential (primary) hypertension: Secondary | ICD-10-CM | POA: Diagnosis not present

## 2023-01-06 DIAGNOSIS — I129 Hypertensive chronic kidney disease with stage 1 through stage 4 chronic kidney disease, or unspecified chronic kidney disease: Secondary | ICD-10-CM | POA: Diagnosis not present

## 2023-01-06 DIAGNOSIS — Z0001 Encounter for general adult medical examination with abnormal findings: Secondary | ICD-10-CM | POA: Diagnosis not present

## 2023-01-06 DIAGNOSIS — Z0000 Encounter for general adult medical examination without abnormal findings: Secondary | ICD-10-CM

## 2023-01-06 DIAGNOSIS — K219 Gastro-esophageal reflux disease without esophagitis: Secondary | ICD-10-CM | POA: Diagnosis not present

## 2023-01-06 LAB — LIPID PANEL

## 2023-01-06 MED ORDER — ROSUVASTATIN CALCIUM 5 MG PO TABS: 5.0000 mg | ORAL_TABLET | Freq: Every day | ORAL | 3 refills | Status: AC

## 2023-01-06 NOTE — Progress Notes (Signed)
 BP (!) 152/82   Pulse 80   Ht 5' 5 (1.651 m)   Wt 166 lb (75.3 kg)   SpO2 97%   BMI 27.62 kg/m    Subjective:   Patient ID: Stacy Henry, female    DOB: [birth year], 78 y.o.   MRN: [id number]  HPI: Stacy Henry is a 78 y.o. female presenting on 01/06/2023 for Medical Management of Chronic Issues (CPE, no pap), Hypertension, and Hyperlipidemia   HPI Physical exam Patient denies any chest pain, shortness of breath, headaches or vision issues, abdominal complaints, diarrhea, nausea, vomiting, or major joint issues.  She does get a little arthritis in her hands and has 1 nodule that popped up from the arthritis recently but for the most part does okay.  She does have a little bit of back soreness just from yesterday from lifting some things but is going to do some stretching to relieve it.  Hypertension Patient is currently on losartan , and their blood pressure today is 152/82, she thinks it runs better at home but does not check it as often and will check it more frequently over the next few weeks. Patient denies any lightheadedness or dizziness. Patient denies headaches, blurred vision, chest pains, shortness of breath, or weakness. Denies any side effects from medication and is content with current medication.   Hyperlipidemia Patient is coming in for recheck of his hyperlipidemia. The patient is currently taking Crestor . They deny any issues with myalgias or history of liver damage from it. They deny any focal numbness or weakness or chest pain.   CKD recheck Patient has CKD will recheck the blood work today.  She denies any urinary issues.  GERD Patient is currently on pantoprazole .  She denies any major symptoms or abdominal pain or belching or burping. She denies any blood in her stool or lightheadedness or dizziness.   Osteoporosis/osteopenia Fractures or history of fracture: None Medication: None Duration of treatment: - Last bone density scan: 07/06/2022 Last T  score: -1.9  Relevant past medical, surgical, family and social history reviewed and updated as indicated. Interim medical history since our last visit reviewed. Allergies and medications reviewed and updated.  Review of Systems  Constitutional:  Negative for chills and fever.  HENT:  Negative for congestion, ear discharge and ear pain.   Eyes:  Negative for redness and visual disturbance.  Respiratory:  Negative for chest tightness and shortness of breath.   Cardiovascular:  Negative for chest pain and leg swelling.  Genitourinary:  Negative for difficulty urinating and dysuria.  Musculoskeletal:  Positive for arthralgias and back pain. Negative for gait problem.  Skin:  Negative for rash.  Neurological:  Negative for dizziness, light-headedness and headaches.  Psychiatric/Behavioral:  Negative for agitation and behavioral problems.   All other systems reviewed and are negative.   Per HPI unless specifically indicated above   Allergies as of 01/06/2023       Reactions   Sulfa Antibiotics         Medication List        Accurate as of January 06, 2023  8:34 AM. If you have any questions, ask your nurse or doctor.          fluticasone  50 MCG/ACT nasal spray Commonly known as: FLONASE  Place 2 sprays into both nostrils daily.   losartan  100 MG tablet Commonly known as: COZAAR  Take 1 tablet (100 mg total) by mouth daily.   multivitamin tablet Take 1 tablet by mouth daily.  pantoprazole  40 MG tablet Commonly known as: PROTONIX  Take 1 tablet (40 mg total) by mouth daily.   PROBIOTIC PO Take by mouth.   rosuvastatin  5 MG tablet Commonly known as: CRESTOR  Take 1 tablet (5 mg total) by mouth at bedtime.   valACYclovir  1000 MG tablet Commonly known as: VALTREX  2gm twice a day for 24h for symptoms         Objective:   BP (!) 152/82   Pulse 80   Ht 5' 5 (1.651 m)   Wt 166 lb (75.3 kg)   SpO2 97%   BMI 27.62 kg/m   Wt Readings from Last 3 Encounters:   01/06/23 166 lb (75.3 kg)  10/12/22 160 lb (72.6 kg)  09/16/22 166 lb (75.3 kg)    Physical Exam Vitals and nursing note reviewed.  Constitutional:      General: She is not in acute distress.    Appearance: She is well-developed. She is not diaphoretic.  Eyes:     Conjunctiva/sclera: Conjunctivae normal.  Cardiovascular:     Rate and Rhythm: Normal rate and regular rhythm.     Heart sounds: Normal heart sounds. No murmur heard. Pulmonary:     Effort: Pulmonary effort is normal. No respiratory distress.     Breath sounds: Normal breath sounds. No wheezing.  Musculoskeletal:        General: Tenderness (Left lumbar pain) present. No swelling. Normal range of motion.     Comments: Heberden's nodule on left middle finger  Skin:    General: Skin is warm and dry.     Findings: No rash.  Neurological:     Mental Status: She is alert and oriented to person, place, and time.     Coordination: Coordination normal.  Psychiatric:        Behavior: Behavior normal.       Assessment & Plan:   Problem List Items Addressed This Visit       Cardiovascular and Mediastinum   Essential hypertension   Relevant Medications   rosuvastatin  (CRESTOR ) 5 MG tablet   Other Relevant Orders   CBC with Differential/Platelet     Digestive   GERD   Relevant Orders   CBC with Differential/Platelet     Genitourinary   Chronic kidney disease (CKD) stage G3b/A1, moderately decreased glomerular filtration rate (GFR) between 30-44 mL/min/1.73 square meter and albuminuria creatinine ratio less than 30 mg/g (HCC)   Relevant Orders   CBC with Differential/Platelet   CMP14+EGFR   Chronic kidney disease due to hypertension     Other   Hyperlipidemia   Relevant Medications   rosuvastatin  (CRESTOR ) 5 MG tablet   Other Relevant Orders   CMP14+EGFR   Lipid panel   Other Visit Diagnoses       Physical exam    -  Primary   Relevant Orders   CBC with Differential/Platelet   CMP14+EGFR   Lipid  panel       Continue current medicine, seems to be doing well, will do blood work today and see back in 6 months.  Suggest 800 international units of vitamin D  daily and 1200 mg elemental calcium  for osteopenia or osteoporosis.   Follow up plan: Return in about 6 months (around 07/06/2023), or if symptoms worsen or fail to improve, for Hypertension and hyperlipidemia and CKD.  Counseling provided for all of the vaccine components Orders Placed This Encounter  Procedures   CBC with Differential/Platelet   CMP14+EGFR   Lipid panel    Fonda Levins,  MD [facility] 01/06/2023, 8:34 AM

## 2023-01-06 NOTE — Patient Instructions (Signed)
Suggest 800 international units of vitamin D daily and 1200 mg elemental calcium for osteopenia or osteoporosis.   

## 2023-01-07 LAB — CMP14+EGFR
ALT: 17 [IU]/L (ref 0–32)
AST: 25 [IU]/L (ref 0–40)
Albumin: 4.2 g/dL (ref 3.8–4.8)
Alkaline Phosphatase: 81 [IU]/L (ref 44–121)
BUN/Creatinine Ratio: 24 (ref 12–28)
BUN: 25 mg/dL (ref 8–27)
Bilirubin Total: 0.3 mg/dL (ref 0.0–1.2)
CO2: 22 mmol/L (ref 20–29)
Calcium: 9.6 mg/dL (ref 8.7–10.3)
Chloride: 107 mmol/L — ABNORMAL HIGH (ref 96–106)
Creatinine, Ser: 1.04 mg/dL — ABNORMAL HIGH (ref 0.57–1.00)
Globulin, Total: 2.9 g/dL (ref 1.5–4.5)
Glucose: 98 mg/dL (ref 70–99)
Potassium: 4.4 mmol/L (ref 3.5–5.2)
Sodium: 145 mmol/L — ABNORMAL HIGH (ref 134–144)
Total Protein: 7.1 g/dL (ref 6.0–8.5)
eGFR: 55 mL/min/{1.73_m2} — ABNORMAL LOW (ref >59)

## 2023-01-07 LAB — CBC WITH DIFFERENTIAL/PLATELET
Basophils Absolute: 0.1 10*3/uL (ref 0.0–0.2)
Basos: 1 %
EOS (ABSOLUTE): 0.3 10*3/uL (ref 0.0–0.4)
Eos: 3 %
Hematocrit: 42.2 % (ref 34.0–46.6)
Hemoglobin: 13.3 g/dL (ref 11.1–15.9)
Immature Grans (Abs): 0 10*3/uL (ref 0.0–0.1)
Immature Granulocytes: 0 %
Lymphocytes Absolute: 3.5 10*3/uL — ABNORMAL HIGH (ref 0.7–3.1)
Lymphs: 38 %
MCH: 27.9 pg (ref 26.6–33.0)
MCHC: 31.5 g/dL (ref 31.5–35.7)
MCV: 89 fL (ref 79–97)
Monocytes Absolute: 1 10*3/uL — ABNORMAL HIGH (ref 0.1–0.9)
Monocytes: 11 %
Neutrophils Absolute: 4.4 10*3/uL (ref 1.4–7.0)
Neutrophils: 47 %
Platelets: 250 10*3/uL (ref 150–450)
RBC: 4.76 x10E6/uL (ref 3.77–5.28)
RDW: 13.4 % (ref 11.7–15.4)
WBC: 9.4 10*3/uL (ref 3.4–10.8)

## 2023-01-07 LAB — LIPID PANEL
Cholesterol, Total: 137 mg/dL (ref 100–199)
HDL: 51 mg/dL (ref >39)
LDL CALC COMMENT:: 2.7 ratio (ref 0.0–4.4)
LDL Chol Calc (NIH): 60 mg/dL (ref 0–99)
Triglycerides: 152 mg/dL — ABNORMAL HIGH (ref 0–149)
VLDL Cholesterol Cal: 26 mg/dL (ref 5–40)

## 2023-03-09 ENCOUNTER — Encounter: Payer: Self-pay | Admitting: Nurse Practitioner

## 2023-03-09 ENCOUNTER — Ambulatory Visit: Admitting: Nurse Practitioner

## 2023-03-09 ENCOUNTER — Ambulatory Visit: Payer: Self-pay | Admitting: Family Medicine

## 2023-03-09 VITALS — BP 174/84 | HR 79 | Temp 96.8°F | Ht 65.0 in | Wt 167.0 lb

## 2023-03-09 DIAGNOSIS — L239 Allergic contact dermatitis, unspecified cause: Secondary | ICD-10-CM

## 2023-03-09 MED ORDER — METHYLPREDNISOLONE ACETATE 80 MG/ML IJ SUSP
80.0000 mg | Freq: Once | INTRAMUSCULAR | Status: AC
  Administered 2023-03-09: 80 mg via INTRAMUSCULAR

## 2023-03-09 MED ORDER — PREDNISONE 20 MG PO TABS: 40.0000 mg | ORAL_TABLET | Freq: Every day | ORAL | 0 refills | Status: AC

## 2023-03-09 NOTE — Telephone Encounter (Signed)
 Copied from CRM [id number]. Topic: Clinical - Red Word Triage >> Mar 09, 2023  8:27 AM Franchot Heidelberg wrote: Red Word that prompted transfer to Nurse Triage: Rash on head, neck, chest. Severe itching and redness   Chief Complaint: widespread itching/rash Symptoms: itching, pain at hairline Frequency: ongoing 3 days Pertinent Negatives: Patient denies fever Disposition: [] ED /[] Urgent Care (no appt availability in office) / [x] Appointment(In office/virtual)/ []  [place] [facility]/ [] Home Care/ [] Refused Recommended Disposition /[] [place] [facility]/ []  Follow-up with PCP Additional Notes:  The patient reported widespread itching and a rash on the back of her head and hairline, both sides of her neck that feels like small bumps.  She said that between her breasts it looks like a rash, but on her collar bone, stomach around her belly button and back, hips it looks like red streaks.  She said she took benadryl, hydrocortisone cream and Hydroxyzine that were all unhelpful for the itching that kept her up all night.  This has been ongoing for 3 days.  The back of her head and neck at her hairline are tender to touch.  She denied any changes with her laundry detergent, soap, diet, or medication.  She was scheduled for a same day appointment for further evaluation.  She was unable to be seen until 12 or later as she had another appointment scheduled.   Reason for Disposition  SEVERE itching (i.e., interferes with sleep, normal activities or school)  Answer Assessment - Initial Assessment Questions 1. APPEARANCE of RASH: "Describe the rash." (e.g., spots, blisters, raised areas, skin peeling, scaly)     Feels like small bumps and looks like red streaks  2. SIZE: "How big are the spots?" (e.g., tip of pen, eraser, coin; inches, centimeters)     Long red strips  3. LOCATION: "Where is the rash located?"     Head, neck,breasts, chest, around belly button, hips 4. COLOR: "What color is the rash?" (Note:  It is difficult to assess rash color in people with darker-colored skin. When this situation occurs, simply ask the caller to describe what they see.)     red 5. ONSET: "When did the rash begin?"     3 days 6. FEVER: "Do you have a fever?" If Yes, ask: "What is your temperature, how was it measured, and when did it start?"     No  7. ITCHING: "Does the rash itch?" If Yes, ask: "How bad is the itch?" (Scale 1-10; or mild, moderate, severe)     Itching kept her up through the night  8. CAUSE: "What do you think is causing the rash?"     unknown 9. MEDICINE FACTORS: "Have you started any new medicines within the last 2 weeks?" (e.g., antibiotics)      No changes  10. OTHER SYMPTOMS: "Do you have any other symptoms?" (e.g., dizziness, headache, sore throat, joint pain)       Tender to touch in back of head and neck  Protocols used: Rash or Redness - [facility]

## 2023-03-09 NOTE — Patient Instructions (Signed)
 Contact Dermatitis Dermatitis is redness, soreness, and swelling (inflammation) of the skin. Contact dermatitis is a reaction to certain substances that touch the skin. There are two types of this condition: Irritant contact dermatitis. This is the most common type. It happens when something irritates your skin, such as when your hands get dry from washing them too often with soap. You can get this type of reaction even if you have not been exposed to the irritant before. Allergic contact dermatitis. This type is caused by a substance that you are allergic to, such as poison ivy. It occurs when you have been exposed to the substance (allergen) and form a sensitivity to it. In some cases, the reaction may start soon after your first exposure to the allergen. In other cases, it may not start until you are exposed to the allergen again. It may then occur every time you are exposed to the allergen in the future. What are the causes? Irritant contact dermatitis is often caused by exposure to: Makeup. Soaps, detergents, and bleaches. Acids. Metal salts, such as nickel. Allergic contact dermatitis is often caused by exposure to: Poisonous plants. Chemicals. Jewelry. Latex. Medicines. Preservatives in products, such as clothes. What increases the risk? You are more likely to get this condition if you have: A job that exposes you to irritants or allergens. Certain medical conditions. These include asthma and eczema. What are the signs or symptoms? Symptoms of this condition may occur in any place on your body that has been touched by the irritant. Symptoms include: Dryness, flaking, or cracking. Redness. Itching. Pain or a burning feeling. Blisters. Drainage of small amounts of blood or clear fluid from skin cracks. With allergic contact dermatitis, there may also be swelling in areas such as the eyelids, mouth, or genitals. How is this diagnosed? This condition is diagnosed with a medical  history and physical exam. A patch skin test may be done to help figure out the cause. If the condition is related to your job, you may need to see an expert in health problems in the workplace (occupational medicine specialist). How is this treated? This condition is treated by staying away from the cause of the reaction and protecting your skin from further contact. Treatment may also include: Steroid creams or ointments. Steroid medicines may need be taken by mouth (orally) in more severe cases. Antibiotics or medicines applied to the skin to kill bacteria (antibacterial ointments). These may be needed if a skin infection is present. Antihistamines. These may be taken orally or put on as a lotion to ease itching. A bandage (dressing). Follow these instructions at home: Skin care Moisturize your skin as needed. Put cool, wet cloths (cool compresses) on the affected areas. Try applying baking soda paste to your skin. Stir water into baking soda until it has the consistency of a paste. Do not scratch your skin. Avoid friction to the affected area. Avoid the use of soaps, perfumes, and dyes. Check the affected areas every day for signs of infection. Check for: More redness, swelling, or pain. More fluid or blood. Warmth. Pus or a bad smell. Medicines Take or apply over-the-counter and prescription medicines only as told by your health care provider. If you were prescribed antibiotics, take or apply them as told by your health care provider. Do not stop using the antibiotic even if you start to feel better. Bathing Try taking a bath with: Epsom salts. Follow the instructions on the packaging. You can get these at your local pharmacy  or grocery store. Baking soda. Pour a small amount into the bath as told by your health care provider. Colloidal oatmeal. Follow the instructions on the packaging. You can get this at your local pharmacy or grocery store. Bathe less often. This may mean bathing  every other day. Bathe in lukewarm water. Avoid using hot water. Bandage care If you were given a dressing, change it as told by your health care provider. Wash your hands with soap and water for at least 20 seconds before and after you change your dressing. If soap and water are not available, use hand sanitizer. General instructions Avoid the substance that caused your reaction. If you do not know what caused it, keep a journal to try to track what caused it. Write down: What you eat and drink. What cosmetics you use. What you wear in the affected area. This includes jewelry. Contact a health care provider if: Your condition does not get better with treatment. Your condition gets worse. You have any signs of infection. You have a fever. You have new symptoms. Your bone or joint under the affected area becomes painful after the skin has healed. Get help right away if: You notice red streaks coming from the affected area. The affected area turns darker. You have trouble breathing. This information is not intended to replace advice given to you by your health care provider. Make sure you discuss any questions you have with your health care provider. Document Revised: 06/25/2021 Document Reviewed: 06/25/2021 Elsevier Patient Education  2024 [employer].

## 2023-03-09 NOTE — Progress Notes (Signed)
   Subjective:    Patient ID: Stacy Henry, female    DOB: 08-25-1945, 78 y.o.   MRN: [id number]   Chief Complaint: rash   Rash The current episode started in the past 7 days. The affected locations include the chest, head and abdomen. Past treatments include topical steroids and antihistamine. The treatment provided mild relief.    Patient Active Problem List   Diagnosis Date Noted   Regular astigmatism of left eye 02/13/2020   Combined forms of age-related cataract of left eye 02/13/2020   Chronic kidney disease due to hypertension 07/26/2018   Insomnia due to stress 07/26/2018   Basal cell carcinoma (BCC) of cheek 04/05/2018   Chronic kidney disease (CKD) stage G3b/A1, moderately decreased glomerular filtration rate (GFR) between 30-44 mL/min/1.73 square meter and albuminuria creatinine ratio less than 30 mg/g (HCC) 01/25/2018   Essential hypertension 07/24/2017   Hyperlipidemia 07/24/2017   Lichen sclerosus et atrophicus 06/10/2016   Allergic rhinitis 07/02/2007   GERD 07/02/2007       Review of Systems  Skin:  Positive for rash.       Objective:   Physical Exam Constitutional:      Appearance: Normal appearance.  Cardiovascular:     Rate and Rhythm: Normal rate and regular rhythm.     Heart sounds: Normal heart sounds.  Pulmonary:     Effort: Pulmonary effort is normal.     Breath sounds: Normal breath sounds.  Skin:    General: Skin is warm.     Findings: Rash: on chest abdomen arms and scap- erythematous maculor papular..  Neurological:     General: No focal deficit present.     Mental Status: She is alert and oriented to person, place, and time.  Psychiatric:        Mood and Affect: Mood normal.        Behavior: Behavior normal.     BP (!) 174/84   Pulse 79   Temp (!) 96.8 F (36 C) (Temporal)   Ht 5\' 5"  (1.651 m)   Wt 167 lb (75.8 kg)   SpO2 95%   BMI 27.79 kg/m        Assessment & Plan:  [name] in today with chief  complaint of No chief complaint on file.   1. Allergic dermatitis (Primary) Avoid scratching Topical steroids  Benadryl or zyrtec for itching RTO prn - methylPREDNISolone acetate (DEPO-MEDROL) injection 80 mg - predniSONE (DELTASONE) 20 MG tablet; Take 2 tablets (40 mg total) by mouth daily with breakfast for 5 days. 2 po daily for 5 days  Dispense: 10 tablet; Refill: 0    The above assessment and management plan was discussed with the patient. The patient verbalized understanding of and has agreed to the management plan. Patient is aware to call the clinic if symptoms persist or worsen. Patient is aware when to return to the clinic for a follow-up visit. Patient educated on when it is appropriate to go to the emergency department.   Mary-Margaret Daphine Deutscher, FNP

## 2023-03-09 NOTE — Telephone Encounter (Signed)
 Appt made

## 2023-03-30 ENCOUNTER — Ambulatory Visit: Admitting: Family Medicine

## 2023-03-30 ENCOUNTER — Encounter: Payer: Self-pay | Admitting: Family Medicine

## 2023-03-30 VITALS — BP 133/69 | HR 77 | Ht 65.0 in | Wt 162.0 lb

## 2023-03-30 DIAGNOSIS — R0609 Other forms of dyspnea: Secondary | ICD-10-CM

## 2023-03-30 DIAGNOSIS — Z9189 Other specified personal risk factors, not elsewhere classified: Secondary | ICD-10-CM | POA: Diagnosis not present

## 2023-03-30 NOTE — Progress Notes (Signed)
 BP 133/69   Pulse 77   Ht 5\' 5"  (1.651 m)   Wt 162 lb (73.5 kg)   SpO2 97%   BMI 26.96 kg/m    Subjective:   Patient ID: Stacy Henry, female    DOB: 02-13-1945, 78 y.o.   MRN: [id number]  HPI: Stacy Henry is a 78 y.o. female presenting on 03/30/2023 for Shortness of Breath and Arm Pain (bilateral)   HPI Shortness of breath Patient has been having increasing shortness of breath when she is up and doing something moving around.  She says when she takes a shower to sometimes will hit her and she gets short of breath.  She denies any chest pain although she does have some weird heaviness sensation with the shortness of breath on the back of both of her arms.  She denies any tightness or pressure.  She denies any sick contacts or anything like a flu.  She says it has been coming on gradually over the past 6 months that she has noticed this but her husband is pushing her to get evaluated.  Relevant past medical, surgical, family and social history reviewed and updated as indicated. Interim medical history since our last visit reviewed. Allergies and medications reviewed and updated.  Review of Systems  Constitutional:  Negative for chills and fever.  HENT:  Negative for congestion, ear discharge and ear pain.   Eyes:  Negative for redness and visual disturbance.  Respiratory:  Positive for shortness of breath. Negative for cough, chest tightness and wheezing.   Cardiovascular:  Negative for chest pain and leg swelling.  Genitourinary:  Negative for difficulty urinating and dysuria.  Musculoskeletal:  Negative for back pain and gait problem.  Skin:  Negative for rash.  Neurological:  Negative for dizziness, light-headedness and headaches.  Psychiatric/Behavioral:  Negative for agitation and behavioral problems.   All other systems reviewed and are negative.   Per HPI unless specifically indicated above   Allergies as of 03/30/2023       Reactions   Sulfa  Antibiotics         Medication List        Accurate as of March 30, 2023  1:04 PM. If you have any questions, ask your nurse or doctor.          fluticasone 50 MCG/ACT nasal spray Commonly known as: FLONASE Place 2 sprays into both nostrils daily.   losartan 100 MG tablet Commonly known as: COZAAR Take 1 tablet (100 mg total) by mouth daily.   multivitamin tablet Take 1 tablet by mouth daily.   pantoprazole 40 MG tablet Commonly known as: PROTONIX Take 1 tablet (40 mg total) by mouth daily.   PROBIOTIC PO Take by mouth.   rosuvastatin 5 MG tablet Commonly known as: CRESTOR Take 1 tablet (5 mg total) by mouth at bedtime.   valACYclovir 1000 MG tablet Commonly known as: VALTREX 2gm twice a day for 24h for symptoms         Objective:   BP 133/69   Pulse 77   Ht 5\' 5"  (1.651 m)   Wt 162 lb (73.5 kg)   SpO2 97%   BMI 26.96 kg/m   Wt Readings from Last 3 Encounters:  03/30/23 162 lb (73.5 kg)  03/09/23 167 lb (75.8 kg)  01/06/23 166 lb (75.3 kg)    Physical Exam Vitals and nursing note reviewed.  Constitutional:      General: She is not in acute distress.  Appearance: She is well-developed. She is not diaphoretic.  Eyes:     Conjunctiva/sclera: Conjunctivae normal.  Cardiovascular:     Rate and Rhythm: Normal rate and regular rhythm.     Heart sounds: Normal heart sounds. No murmur heard. Pulmonary:     Effort: Pulmonary effort is normal. No respiratory distress.     Breath sounds: Normal breath sounds. No wheezing, rhonchi or rales.  Musculoskeletal:        General: No swelling.  Skin:    General: Skin is warm and dry.     Findings: No rash.  Neurological:     Mental Status: She is alert and oriented to person, place, and time.     Coordination: Coordination normal.  Psychiatric:        Behavior: Behavior normal.     The 10-year ASCVD risk score (Arnett DK, et al., 2019) is: 33.4%   Values used to calculate the score:     Age: 83  years     Sex: Female     Is Non-Hispanic African American: No     Diabetic: No     Tobacco smoker: No     Systolic Blood Pressure: 133 mmHg     Is BP treated: Yes     HDL Cholesterol: 51 mg/dL     Total Cholesterol: 137 mg/dL  Assessment & Plan:   Problem List Items Addressed This Visit   None Visit Diagnoses       Exertional dyspnea    -  Primary   Relevant Orders   Ambulatory referral to Cardiology     [place] cardiac risk >20% in next 10 years       Relevant Orders   Ambulatory referral to Cardiology     Will place referral to cardiology because of exertional dyspnea and high cardiac risk score.  Follow up plan: Return if symptoms worsen or fail to improve.  Counseling provided for all of the vaccine components Orders Placed This Encounter  Procedures   Ambulatory referral to Cardiology    Arville Care, MD [facility] 03/30/2023, 1:04 PM

## 2023-03-31 DIAGNOSIS — M722 Plantar fascial fibromatosis: Secondary | ICD-10-CM | POA: Diagnosis not present

## 2023-03-31 DIAGNOSIS — M84371A Stress fracture, right ankle, initial encounter for fracture: Secondary | ICD-10-CM | POA: Diagnosis not present

## 2023-04-24 DIAGNOSIS — M9903 Segmental and somatic dysfunction of lumbar region: Secondary | ICD-10-CM | POA: Diagnosis not present

## 2023-04-24 DIAGNOSIS — M62830 Muscle spasm of back: Secondary | ICD-10-CM | POA: Diagnosis not present

## 2023-04-24 DIAGNOSIS — M9902 Segmental and somatic dysfunction of thoracic region: Secondary | ICD-10-CM | POA: Diagnosis not present

## 2023-04-24 DIAGNOSIS — M9901 Segmental and somatic dysfunction of cervical region: Secondary | ICD-10-CM | POA: Diagnosis not present

## 2023-04-25 DIAGNOSIS — M9902 Segmental and somatic dysfunction of thoracic region: Secondary | ICD-10-CM | POA: Diagnosis not present

## 2023-04-25 DIAGNOSIS — M9901 Segmental and somatic dysfunction of cervical region: Secondary | ICD-10-CM | POA: Diagnosis not present

## 2023-04-25 DIAGNOSIS — M62830 Muscle spasm of back: Secondary | ICD-10-CM | POA: Diagnosis not present

## 2023-04-25 DIAGNOSIS — M9903 Segmental and somatic dysfunction of lumbar region: Secondary | ICD-10-CM | POA: Diagnosis not present

## 2023-04-26 DIAGNOSIS — M62830 Muscle spasm of back: Secondary | ICD-10-CM | POA: Diagnosis not present

## 2023-04-26 DIAGNOSIS — M9903 Segmental and somatic dysfunction of lumbar region: Secondary | ICD-10-CM | POA: Diagnosis not present

## 2023-04-26 DIAGNOSIS — M9902 Segmental and somatic dysfunction of thoracic region: Secondary | ICD-10-CM | POA: Diagnosis not present

## 2023-04-26 DIAGNOSIS — M9901 Segmental and somatic dysfunction of cervical region: Secondary | ICD-10-CM | POA: Diagnosis not present

## 2023-05-01 DIAGNOSIS — M9901 Segmental and somatic dysfunction of cervical region: Secondary | ICD-10-CM | POA: Diagnosis not present

## 2023-05-01 DIAGNOSIS — M62830 Muscle spasm of back: Secondary | ICD-10-CM | POA: Diagnosis not present

## 2023-05-01 DIAGNOSIS — M25512 Pain in left shoulder: Secondary | ICD-10-CM | POA: Diagnosis not present

## 2023-05-01 DIAGNOSIS — M542 Cervicalgia: Secondary | ICD-10-CM | POA: Diagnosis not present

## 2023-05-01 DIAGNOSIS — M9902 Segmental and somatic dysfunction of thoracic region: Secondary | ICD-10-CM | POA: Diagnosis not present

## 2023-05-01 DIAGNOSIS — M9903 Segmental and somatic dysfunction of lumbar region: Secondary | ICD-10-CM | POA: Diagnosis not present

## 2023-05-11 DIAGNOSIS — M542 Cervicalgia: Secondary | ICD-10-CM | POA: Diagnosis not present

## 2023-05-16 DIAGNOSIS — M9902 Segmental and somatic dysfunction of thoracic region: Secondary | ICD-10-CM | POA: Diagnosis not present

## 2023-05-16 DIAGNOSIS — M62830 Muscle spasm of back: Secondary | ICD-10-CM | POA: Diagnosis not present

## 2023-05-16 DIAGNOSIS — M9903 Segmental and somatic dysfunction of lumbar region: Secondary | ICD-10-CM | POA: Diagnosis not present

## 2023-05-16 DIAGNOSIS — M9901 Segmental and somatic dysfunction of cervical region: Secondary | ICD-10-CM | POA: Diagnosis not present

## 2023-05-17 DIAGNOSIS — M62830 Muscle spasm of back: Secondary | ICD-10-CM | POA: Diagnosis not present

## 2023-05-17 DIAGNOSIS — M9901 Segmental and somatic dysfunction of cervical region: Secondary | ICD-10-CM | POA: Diagnosis not present

## 2023-05-17 DIAGNOSIS — M9902 Segmental and somatic dysfunction of thoracic region: Secondary | ICD-10-CM | POA: Diagnosis not present

## 2023-05-17 DIAGNOSIS — M9903 Segmental and somatic dysfunction of lumbar region: Secondary | ICD-10-CM | POA: Diagnosis not present

## 2023-05-18 DIAGNOSIS — M62830 Muscle spasm of back: Secondary | ICD-10-CM | POA: Diagnosis not present

## 2023-05-18 DIAGNOSIS — M9901 Segmental and somatic dysfunction of cervical region: Secondary | ICD-10-CM | POA: Diagnosis not present

## 2023-05-18 DIAGNOSIS — M9902 Segmental and somatic dysfunction of thoracic region: Secondary | ICD-10-CM | POA: Diagnosis not present

## 2023-05-18 DIAGNOSIS — M9903 Segmental and somatic dysfunction of lumbar region: Secondary | ICD-10-CM | POA: Diagnosis not present

## 2023-05-19 DIAGNOSIS — M542 Cervicalgia: Secondary | ICD-10-CM | POA: Diagnosis not present

## 2023-05-22 ENCOUNTER — Encounter: Payer: Self-pay | Admitting: Cardiology

## 2023-05-22 ENCOUNTER — Ambulatory Visit: Attending: Cardiology | Admitting: Cardiology

## 2023-05-22 VITALS — BP 138/81 | HR 81 | Resp 16 | Ht 65.0 in | Wt 162.8 lb

## 2023-05-22 DIAGNOSIS — R0609 Other forms of dyspnea: Secondary | ICD-10-CM | POA: Insufficient documentation

## 2023-05-22 DIAGNOSIS — R072 Precordial pain: Secondary | ICD-10-CM | POA: Insufficient documentation

## 2023-05-22 DIAGNOSIS — M9902 Segmental and somatic dysfunction of thoracic region: Secondary | ICD-10-CM | POA: Diagnosis not present

## 2023-05-22 DIAGNOSIS — I10 Essential (primary) hypertension: Secondary | ICD-10-CM | POA: Diagnosis not present

## 2023-05-22 DIAGNOSIS — M62830 Muscle spasm of back: Secondary | ICD-10-CM | POA: Diagnosis not present

## 2023-05-22 DIAGNOSIS — M9903 Segmental and somatic dysfunction of lumbar region: Secondary | ICD-10-CM | POA: Diagnosis not present

## 2023-05-22 DIAGNOSIS — M9901 Segmental and somatic dysfunction of cervical region: Secondary | ICD-10-CM | POA: Diagnosis not present

## 2023-05-22 MED ORDER — ASPIRIN 81 MG PO TBEC: 81.0000 mg | DELAYED_RELEASE_TABLET | Freq: Every day | ORAL | Status: DC

## 2023-05-22 MED ORDER — METOPROLOL TARTRATE 100 MG PO TABS: 100.0000 mg | ORAL_TABLET | ORAL | 0 refills | Status: DC

## 2023-05-22 MED ORDER — NITROGLYCERIN 0.4 MG SL SUBL: 0.4000 mg | SUBLINGUAL_TABLET | SUBLINGUAL | 3 refills | Status: DC | PRN

## 2023-05-22 NOTE — Progress Notes (Signed)
 Cardiology Office Note:  .   Date:  05/22/2023  ID:  Stacy Henry, DOB 14-Mar-1945, MRN [id number] PCP: Dettinger, Lucio Sabin, MD  [facility] Providers Cardiologist:  Fransico Ivy, MD PCP: Dettinger, Lucio Sabin, MD  Chief Complaint  Patient presents with   Shortness of Breath   New Patient (Initial Visit)     Stacy Henry is a 78 y.o. female with hypertension, hyperlipidemia, family history of early CAD, precordial pain, exertional dyspnea  Discussed the use of AI scribe software for clinical note transcription with the patient, who gave verbal consent to proceed.  History of Present Illness Stacy Henry "Stacy Henry" is a 78 year old female with hypertension and high cholesterol who presents with shortness of breath and chest heaviness.  Shortness of breath has been present for six months, worsening recently. It is described as a 'flutter' for a second during activities like walking, requiring deep breaths, occurring multiple times daily, and improving with rest. Chest pressure is noted, especially when lying flat, relieved by head elevation, leading her to sleep in a recliner for the past month. Chest heaviness also occurs with physical activity. Lightheadedness occurs with walking or using stairs, lasting a few seconds and improving with rest. New ankle swelling is observed.  Her diet is high in sugar, with limited red meat and salt intake. She primarily consumes home-cooked meals, using fresh and canned foods, draining canned food water  before cooking. There is a significant family history of heart disease, with many relatives dying young from heart attacks. She does not smoke and remains active in her community and church.      Vitals:   05/22/23 0825  BP: 138/81  Pulse: 81  Resp: 16  SpO2: 97%      Review of Systems  Cardiovascular:  Positive for chest pain and dyspnea on exertion. Negative for leg swelling, palpitations and syncope.         Studies Reviewed: Aaron Aas        EKG 05/22/2023: Normal sinus rhythm Possible Inferior infarct , age undetermined Possible Anterior infarct , age undetermined No previous ECGs available    Independently interpreted 01/2023: Chol 137, TG 152, HDL 51, LDL 60 Hb 13.3 Cr 4.09   Physical Exam Vitals and nursing note reviewed.  Constitutional:      General: She is not in acute distress. Neck:     Vascular: No JVD.  Cardiovascular:     Rate and Rhythm: Normal rate and regular rhythm.     Heart sounds: Normal heart sounds. No murmur heard. Pulmonary:     Effort: Pulmonary effort is normal.     Breath sounds: Normal breath sounds. No wheezing or rales.  Musculoskeletal:     Right lower leg: No edema.     Left lower leg: No edema.      VISIT DIAGNOSES:   ICD-10-CM   1. Precordial pain  R07.2 EKG 12-Lead    CT CORONARY MORPH W/CTA COR W/SCORE W/CA W/CM &/OR WO/CM    2. Exertional dyspnea  R06.09 EKG 12-Lead    ECHOCARDIOGRAM COMPLETE    CT CORONARY MORPH W/CTA COR W/SCORE W/CA W/CM &/OR WO/CM    3. Essential hypertension  I10 EKG 12-Lead    Basic metabolic panel with GFR       Stacy Henry is a 78 y.o. female with hypertension, hyperlipidemia, family history of early CAD, precordial pain, exertional dyspnea  Assessment & Plan Exertional dyspnea, precordial pain: Dyspnea and chest heaviness with exertion  and orthopnea for six months.  Physical exam unremarkable for any signs of heart failure.  Suspect obstructive CAD, does have strong family history. Resting EKG is abnormal with possible old anterior, inferior infarct. Recommend echocardiogram, coronary CT angiogram. Recommend aspirin  81 mg daily, sublingual nitroglycerin  as needed. Continue Crestor  5 mg daily, lipids very well-controlled.  Hypertension: Blood pressure slightly elevated, no change made today.  Continue current medication losartan .       Meds ordered this encounter  Medications    aspirin  EC 81 MG tablet    Sig: Take 1 tablet (81 mg total) by mouth daily. Swallow whole.   nitroGLYCERIN  (NITROSTAT ) 0.4 MG SL tablet    Sig: Place 1 tablet (0.4 mg total) under the tongue every 5 (five) minutes as needed for chest pain.    Dispense:  25 tablet    Refill:  3   metoprolol  tartrate (LOPRESSOR ) 100 MG tablet    Sig: Take 1 tablet (100 mg total) by mouth as directed. Take one tablet (2) hours before your CT scan    Dispense:  1 tablet    Refill:  0     F/u in 3 months  Signed, Cody Das, MD

## 2023-05-22 NOTE — Patient Instructions (Addendum)
 Medication Instructions:  START Aspirin  81 mg  START Nitroglycerin  as needed for chest pain   *If you need a refill on your cardiac medications before your next appointment, please call your pharmacy*  Lab Work: BMP  If you have labs (blood work) drawn today and your tests are completely normal, you will receive your results only by: MyChart Message (if you have MyChart) OR A paper copy in the mail If you have any lab test that is abnormal or we need to change your treatment, we will call you to review the results.  Testing/Procedures:   Your cardiac CT will be scheduled at one of the below locations:   [facility] [address] [place], [place] [address] [phone]  or  [facility] and Vascular Tower [address]  [place], [place] [address] Opening May 01, 2023  If scheduled at [facility], please arrive at the [facility] Entrance (Entrance C2) of [facility] 30 minutes prior to test start time. You can use the FREE valet parking offered at entrance C (encouraged to control the heart rate for the test)  Proceed to the [facility] Radiology Department (first floor) to check-in and test prep.   All radiology patients and guests should use entrance C2 at [facility], accessed from [facility], even though the hospital's physical address listed is [address]    If scheduled at the Heart and Vascular Tower at [address], please enter the parking lot using the [address] entrance and use the FREE valet service at the patient drop-off area. Enter the buidling and check-in with registration on the main floor.  Please follow these instructions carefully (unless otherwise directed):  An IV will be required for this test and Nitroglycerin  will be given.  Hold all erectile dysfunction medications at least 3 days (72 hrs) prior to test. (Ie viagra, cialis, sildenafil, tadalafil, etc)    On the Night Before the Test: Be sure to Drink plenty of water . Do not consume any caffeinated/decaffeinated beverages or chocolate 12 hours prior to your test. Do not take any antihistamines 12 hours prior to your test.  On the Day of the Test: Drink plenty of water  until 1 hour prior to the test. Do not eat any food 1 hour prior to test. You may take your regular medications prior to the test.  Take metoprolol  (Lopressor ) two hours prior to test. If you take Furosemide/Hydrochlorothiazide /Spironolactone/Chlorthalidone , please HOLD on the morning of the test. Patients who wear a continuous glucose monitor MUST remove the device prior to scanning. FEMALES- please wear underwire-free bra if available, avoid dresses & tight clothing  After the Test: Drink plenty of water . After receiving IV contrast, you may experience a mild flushed feeling. This is normal. On occasion, you may experience a mild rash up to 24 hours after the test. This is not dangerous. If this occurs, you can take Benadryl 25 mg, Zyrtec, Claritin, or Allegra  and increase your fluid intake. (Patients taking Tikosyn should avoid Benadryl, and may take Zyrtec, Claritin, or Allegra ) If you experience trouble breathing, this can be serious. If it is severe call 911 IMMEDIATELY. If it is mild, please call our office.  We will call to schedule your test 2-4 weeks out understanding that some insurance companies will need an authorization prior to the service being performed.   For more information and frequently asked questions, please visit our website : [email or web address]  For non-scheduling related  questions, please contact the cardiac imaging nurse navigator should you have any questions/concerns: Cardiac Imaging Nurse Navigators Direct Office Dial: [phone]   For scheduling needs, including cancellations and rescheduling, please call [place], [phone].  Echo  Your physician has requested that  you have an echocardiogram. Echocardiography is a painless test that uses sound waves to create images of your heart. It provides your doctor with information about the size and shape of your heart and how well your heart's chambers and valves are working. This procedure takes approximately one hour. There are no restrictions for this procedure. Please do NOT wear cologne, perfume, aftershave, or lotions (deodorant is allowed). Please arrive 15 minutes prior to your appointment time.  Please note: We ask at that you not bring children with you during ultrasound (echo/ vascular) testing. Due to room size and safety concerns, children are not allowed in the ultrasound rooms during exams. Our front office staff cannot provide observation of children in our lobby area while testing is being conducted. An adult accompanying a patient to their appointment will only be allowed in the ultrasound room at the discretion of the ultrasound technician under special circumstances. We apologize for any inconvenience.  Follow-Up: At [facility] you and your health needs are our priority.  As part of our continuing mission to provide you with exceptional heart care, our providers are all part of one team.  This team includes your primary Cardiologist (physician) and Advanced Practice Providers or APPs (Physician Assistants and Nurse Practitioners) who all work together to provide you with the care you need, when you need it.  Your next appointment:   3 month(s)  Provider:   One of our Advanced Practice Providers (APPs): Melita Springer, PA-C  Friddie Jetty, NP Evaline Hill, NP  Theotis Flake, PA-C Lawana Pray, NP  Willis Harter, PA-C Lovette Rud, PA-C  [place], [place] Charles Connor, NP  Marlana Silvan, NP Marcie Sever, PA-C  Laquita Plant, PA-C    Dayna Dunn, PA-C  Marlyse Single, PA-C Palmer Bobo, NP Katlyn West, NP Callie Goodrich, PA-C  Evan Williams, PA-C Sheng Haley, PA-C  Xika Zhao,  NP Kathleen Johnson, PA-C

## 2023-05-23 DIAGNOSIS — H02831 Dermatochalasis of right upper eyelid: Secondary | ICD-10-CM | POA: Diagnosis not present

## 2023-05-23 DIAGNOSIS — Z961 Presence of intraocular lens: Secondary | ICD-10-CM | POA: Diagnosis not present

## 2023-05-23 DIAGNOSIS — H02834 Dermatochalasis of left upper eyelid: Secondary | ICD-10-CM | POA: Diagnosis not present

## 2023-05-23 DIAGNOSIS — H0015 Chalazion left lower eyelid: Secondary | ICD-10-CM | POA: Diagnosis not present

## 2023-05-23 DIAGNOSIS — H526 Other disorders of refraction: Secondary | ICD-10-CM | POA: Diagnosis not present

## 2023-05-23 DIAGNOSIS — H26493 Other secondary cataract, bilateral: Secondary | ICD-10-CM | POA: Diagnosis not present

## 2023-05-23 DIAGNOSIS — H43813 Vitreous degeneration, bilateral: Secondary | ICD-10-CM | POA: Diagnosis not present

## 2023-05-23 LAB — BASIC METABOLIC PANEL WITH GFR
BUN/Creatinine Ratio: 24 (ref 12–28)
BUN: 27 mg/dL (ref 8–27)
CO2: 19 mmol/L — ABNORMAL LOW (ref 20–29)
Calcium: 9.6 mg/dL (ref 8.7–10.3)
Chloride: 106 mmol/L (ref 96–106)
Creatinine, Ser: 1.11 mg/dL — ABNORMAL HIGH (ref 0.57–1.00)
Glucose: 69 mg/dL — ABNORMAL LOW (ref 70–99)
Potassium: 4.6 mmol/L (ref 3.5–5.2)
Sodium: 140 mmol/L (ref 134–144)
eGFR: 51 mL/min/{1.73_m2} — ABNORMAL LOW (ref >59)

## 2023-05-24 DIAGNOSIS — M9901 Segmental and somatic dysfunction of cervical region: Secondary | ICD-10-CM | POA: Diagnosis not present

## 2023-05-24 DIAGNOSIS — M9902 Segmental and somatic dysfunction of thoracic region: Secondary | ICD-10-CM | POA: Diagnosis not present

## 2023-05-24 DIAGNOSIS — M9903 Segmental and somatic dysfunction of lumbar region: Secondary | ICD-10-CM | POA: Diagnosis not present

## 2023-05-24 DIAGNOSIS — M62830 Muscle spasm of back: Secondary | ICD-10-CM | POA: Diagnosis not present

## 2023-05-25 DIAGNOSIS — M9901 Segmental and somatic dysfunction of cervical region: Secondary | ICD-10-CM | POA: Diagnosis not present

## 2023-05-25 DIAGNOSIS — M62830 Muscle spasm of back: Secondary | ICD-10-CM | POA: Diagnosis not present

## 2023-05-25 DIAGNOSIS — M9902 Segmental and somatic dysfunction of thoracic region: Secondary | ICD-10-CM | POA: Diagnosis not present

## 2023-05-25 DIAGNOSIS — M9903 Segmental and somatic dysfunction of lumbar region: Secondary | ICD-10-CM | POA: Diagnosis not present

## 2023-05-30 DIAGNOSIS — M9903 Segmental and somatic dysfunction of lumbar region: Secondary | ICD-10-CM | POA: Diagnosis not present

## 2023-05-30 DIAGNOSIS — M9902 Segmental and somatic dysfunction of thoracic region: Secondary | ICD-10-CM | POA: Diagnosis not present

## 2023-05-30 DIAGNOSIS — M9901 Segmental and somatic dysfunction of cervical region: Secondary | ICD-10-CM | POA: Diagnosis not present

## 2023-05-30 DIAGNOSIS — M5412 Radiculopathy, cervical region: Secondary | ICD-10-CM | POA: Diagnosis not present

## 2023-05-30 DIAGNOSIS — M62830 Muscle spasm of back: Secondary | ICD-10-CM | POA: Diagnosis not present

## 2023-06-05 ENCOUNTER — Encounter (HOSPITAL_COMMUNITY): Payer: Self-pay

## 2023-06-05 DIAGNOSIS — M62830 Muscle spasm of back: Secondary | ICD-10-CM | POA: Diagnosis not present

## 2023-06-05 DIAGNOSIS — M9901 Segmental and somatic dysfunction of cervical region: Secondary | ICD-10-CM | POA: Diagnosis not present

## 2023-06-05 DIAGNOSIS — M9903 Segmental and somatic dysfunction of lumbar region: Secondary | ICD-10-CM | POA: Diagnosis not present

## 2023-06-05 DIAGNOSIS — M9902 Segmental and somatic dysfunction of thoracic region: Secondary | ICD-10-CM | POA: Diagnosis not present

## 2023-06-06 ENCOUNTER — Telehealth (HOSPITAL_COMMUNITY): Payer: Self-pay | Admitting: *Deleted

## 2023-06-06 NOTE — Telephone Encounter (Signed)

## 2023-06-07 ENCOUNTER — Ambulatory Visit (HOSPITAL_COMMUNITY)
Admission: RE | Admit: 2023-06-07 | Discharge: 2023-06-07 | Disposition: A | Discharge status: Home / Self Care | Source: Ambulatory Visit | Attending: Cardiology | Admitting: Cardiology

## 2023-06-07 DIAGNOSIS — M9902 Segmental and somatic dysfunction of thoracic region: Secondary | ICD-10-CM | POA: Diagnosis not present

## 2023-06-07 DIAGNOSIS — M9901 Segmental and somatic dysfunction of cervical region: Secondary | ICD-10-CM | POA: Diagnosis not present

## 2023-06-07 DIAGNOSIS — R072 Precordial pain: Secondary | ICD-10-CM | POA: Diagnosis not present

## 2023-06-07 DIAGNOSIS — R0609 Other forms of dyspnea: Secondary | ICD-10-CM

## 2023-06-07 DIAGNOSIS — M9903 Segmental and somatic dysfunction of lumbar region: Secondary | ICD-10-CM | POA: Diagnosis not present

## 2023-06-07 DIAGNOSIS — M62830 Muscle spasm of back: Secondary | ICD-10-CM | POA: Diagnosis not present

## 2023-06-07 MED ORDER — NITROGLYCERIN 0.4 MG SL SUBL
SUBLINGUAL_TABLET | SUBLINGUAL | Status: AC
  Filled 2023-06-07: qty 2

## 2023-06-07 MED ORDER — NITROGLYCERIN 0.4 MG SL SUBL
0.8000 mg | SUBLINGUAL_TABLET | Freq: Once | SUBLINGUAL | Status: AC
  Administered 2023-06-07: 0.8 mg via SUBLINGUAL

## 2023-06-07 MED ORDER — IOHEXOL 350 MG/ML SOLN
100.0000 mL | Freq: Once | INTRAVENOUS | Status: AC | PRN
Start: 2023-06-07 — End: 2023-06-07
  Administered 2023-06-07: 100 mL via INTRAVENOUS

## 2023-06-09 ENCOUNTER — Ambulatory Visit: Payer: Self-pay | Admitting: Cardiology

## 2023-06-09 NOTE — Progress Notes (Signed)
 Good news.  CT scan shows no coronary artery disease, no calcium  noted in the coronary arteries either.  Okay to stop aspirin  81 mg daily.  Continue Crestor  5 mg daily with which lipids are well-controlled.  Echocardiogram is pending.  If the flutter symptoms are recurrent and still persistent, recommend 2-week Zio monitor.  Thanks MJP

## 2023-06-12 DIAGNOSIS — M9903 Segmental and somatic dysfunction of lumbar region: Secondary | ICD-10-CM | POA: Diagnosis not present

## 2023-06-12 DIAGNOSIS — M62830 Muscle spasm of back: Secondary | ICD-10-CM | POA: Diagnosis not present

## 2023-06-12 DIAGNOSIS — M9901 Segmental and somatic dysfunction of cervical region: Secondary | ICD-10-CM | POA: Diagnosis not present

## 2023-06-12 DIAGNOSIS — M9902 Segmental and somatic dysfunction of thoracic region: Secondary | ICD-10-CM | POA: Diagnosis not present

## 2023-06-14 DIAGNOSIS — M9903 Segmental and somatic dysfunction of lumbar region: Secondary | ICD-10-CM | POA: Diagnosis not present

## 2023-06-14 DIAGNOSIS — M9902 Segmental and somatic dysfunction of thoracic region: Secondary | ICD-10-CM | POA: Diagnosis not present

## 2023-06-14 DIAGNOSIS — M9901 Segmental and somatic dysfunction of cervical region: Secondary | ICD-10-CM | POA: Diagnosis not present

## 2023-06-14 DIAGNOSIS — M62830 Muscle spasm of back: Secondary | ICD-10-CM | POA: Diagnosis not present

## 2023-06-19 DIAGNOSIS — M9902 Segmental and somatic dysfunction of thoracic region: Secondary | ICD-10-CM | POA: Diagnosis not present

## 2023-06-19 DIAGNOSIS — M62830 Muscle spasm of back: Secondary | ICD-10-CM | POA: Diagnosis not present

## 2023-06-19 DIAGNOSIS — M9901 Segmental and somatic dysfunction of cervical region: Secondary | ICD-10-CM | POA: Diagnosis not present

## 2023-06-19 DIAGNOSIS — M9903 Segmental and somatic dysfunction of lumbar region: Secondary | ICD-10-CM | POA: Diagnosis not present

## 2023-06-27 ENCOUNTER — Ambulatory Visit (HOSPITAL_COMMUNITY)
Admission: RE | Admit: 2023-06-27 | Discharge: 2023-06-27 | Disposition: A | Discharge status: Home / Self Care | Source: Ambulatory Visit | Attending: Cardiology | Admitting: Cardiology

## 2023-06-27 DIAGNOSIS — R0609 Other forms of dyspnea: Secondary | ICD-10-CM | POA: Diagnosis not present

## 2023-06-27 LAB — ECHOCARDIOGRAM COMPLETE
Area-P 1/2: 4.17 cm2
P 1/2 time: 372 ms
S' Lateral: 2.4 cm

## 2023-06-27 NOTE — Progress Notes (Signed)
 Normal heart function.  Mild leakiness of the aortic valve.  In absence of any recurrent dyspnea symptoms, do not think she needs repeat echocardiogram.  Keep follow-up with Tessa in August.  Thanks MJP

## 2023-06-28 NOTE — Progress Notes (Signed)
 What I meant is the aortic regurgitation should not be the reason for her dyspnea and may not need monitoring. We will await stress test results.  Thanks MJP

## 2023-06-28 NOTE — Progress Notes (Signed)
 When is there stress test going to be? I am hoping that will give more information.   Thanks MJP

## 2023-06-29 NOTE — Progress Notes (Signed)
 You are right.  We will await coronary CT angiogram results, no stress tests needed.  CCTA will give us  adequate information.  Thanks MJP

## 2023-07-06 ENCOUNTER — Encounter: Payer: Self-pay | Admitting: Family Medicine

## 2023-07-06 ENCOUNTER — Ambulatory Visit (INDEPENDENT_AMBULATORY_CARE_PROVIDER_SITE_OTHER): Payer: Medicare Other | Admitting: Family Medicine

## 2023-07-06 ENCOUNTER — Other Ambulatory Visit

## 2023-07-06 VITALS — BP 137/79 | HR 92 | Ht 65.0 in | Wt 163.0 lb

## 2023-07-06 DIAGNOSIS — R0602 Shortness of breath: Secondary | ICD-10-CM

## 2023-07-06 DIAGNOSIS — Z23 Encounter for immunization: Secondary | ICD-10-CM

## 2023-07-06 DIAGNOSIS — E782 Mixed hyperlipidemia: Secondary | ICD-10-CM | POA: Diagnosis not present

## 2023-07-06 DIAGNOSIS — I10 Essential (primary) hypertension: Secondary | ICD-10-CM | POA: Diagnosis not present

## 2023-07-06 DIAGNOSIS — Z1231 Encounter for screening mammogram for malignant neoplasm of breast: Secondary | ICD-10-CM | POA: Diagnosis not present

## 2023-07-06 DIAGNOSIS — R002 Palpitations: Secondary | ICD-10-CM | POA: Diagnosis not present

## 2023-07-06 DIAGNOSIS — K219 Gastro-esophageal reflux disease without esophagitis: Secondary | ICD-10-CM | POA: Diagnosis not present

## 2023-07-06 DIAGNOSIS — N1832 Chronic kidney disease, stage 3b: Secondary | ICD-10-CM

## 2023-07-06 DIAGNOSIS — B001 Herpesviral vesicular dermatitis: Secondary | ICD-10-CM

## 2023-07-06 DIAGNOSIS — I129 Hypertensive chronic kidney disease with stage 1 through stage 4 chronic kidney disease, or unspecified chronic kidney disease: Secondary | ICD-10-CM | POA: Diagnosis not present

## 2023-07-06 LAB — LIPID PANEL

## 2023-07-06 MED ORDER — VALACYCLOVIR HCL 1 G PO TABS: ORAL_TABLET | ORAL | 2 refills | Status: AC

## 2023-07-06 MED ORDER — LOSARTAN POTASSIUM 100 MG PO TABS
100.0000 mg | ORAL_TABLET | Freq: Every day | ORAL | 3 refills | Status: DC
Start: 2023-07-06 — End: 2023-08-22

## 2023-07-06 MED ORDER — PANTOPRAZOLE SODIUM 40 MG PO TBEC: 40.0000 mg | DELAYED_RELEASE_TABLET | Freq: Every day | ORAL | 3 refills | Status: AC

## 2023-07-06 NOTE — Progress Notes (Signed)
 BP 137/79   Pulse 92   Ht 5' 5 (1.651 m)   Wt 163 lb (73.9 kg)   SpO2 96%   BMI 27.12 kg/m    Subjective:   Patient ID: Stacy Henry, female    DOB: 01/26/1945, 78 y.o.   MRN: [id number]  HPI: Stacy Henry is a 78 y.o. female presenting on 07/06/2023 for Medical Management of Chronic Issues, Chronic Kidney Disease, Hyperlipidemia, and Hypertension   HPI Hypertension and CKD recheck Patient is currently on losartan  and metoprolol , and their blood pressure today is 137/79. Patient denies any lightheadedness or dizziness. Patient denies headaches, blurred vision, chest pains, shortness of breath, or weakness. Denies any side effects from medication and is content with current medication.   Hyperlipidemia Patient is coming in for recheck of his hyperlipidemia. The patient is currently taking Crestor . They deny any issues with myalgias or history of liver damage from it. They deny any focal numbness or weakness or chest pain.   GERD Patient is currently on pantoprazole .  She denies any major symptoms or abdominal pain or belching or burping. She denies any blood in her stool or lightheadedness or dizziness.   Palpitations and exertional shortness of breath Patient is coming in today with complaints of intermittent palpitations and occasional shortness of breath.  She says that after she is up and doing something like working in the garden she will get short of breath and feel like her heart is racing and feel like she cannot catch her breath and that she has to go sit down.  She says sometimes it will happen when she is walking across her house and short distances where she will feel like that as well.  She says that this does not happen every day but only some of the time but has been happening more frequently over the past 6 months.  She says that that she did go see a cardiologist and they did some testing and said the heart looked okay.  Relevant past medical, surgical,  family and social history reviewed and updated as indicated. Interim medical history since our last visit reviewed. Allergies and medications reviewed and updated.  Review of Systems  Constitutional:  Negative for chills and fever.  HENT:  Negative for congestion, ear discharge and ear pain.   Eyes:  Negative for redness and visual disturbance.  Respiratory:  Positive for shortness of breath. Negative for cough, chest tightness and wheezing.   Cardiovascular:  Positive for palpitations. Negative for chest pain and leg swelling.  Genitourinary:  Negative for difficulty urinating and dysuria.  Musculoskeletal:  Negative for back pain and gait problem.  Skin:  Negative for rash.  Neurological:  Negative for light-headedness and headaches.  Psychiatric/Behavioral:  Negative for agitation and behavioral problems.   All other systems reviewed and are negative.   Per HPI unless specifically indicated above   Allergies as of 07/06/2023       Reactions   Sulfa Antibiotics         Medication List        Accurate as of July 06, 2023  8:59 AM. If you have any questions, ask your nurse or doctor.          STOP taking these medications    nitroGLYCERIN  0.4 MG SL tablet Commonly known as: NITROSTAT  Stopped by: [facility] LABOR Mahogani Holohan   oxyCODONE-acetaminophen 10-325 MG tablet Commonly known as: PERCOCET Stopped by: Fonda LABOR Xuan Mateus       TAKE these  medications    aspirin  EC 81 MG tablet Take 1 tablet (81 mg total) by mouth daily. Swallow whole.   fluticasone  50 MCG/ACT nasal spray Commonly known as: FLONASE  Place 2 sprays into both nostrils daily.   losartan  100 MG tablet Commonly known as: COZAAR  Take 1 tablet (100 mg total) by mouth daily.   metoprolol  tartrate 100 MG tablet Commonly known as: LOPRESSOR  Take 1 tablet (100 mg total) by mouth as directed. Take one tablet (2) hours before your CT scan   multivitamin tablet Take 1 tablet by mouth daily.   pantoprazole   40 MG tablet Commonly known as: PROTONIX  Take 1 tablet (40 mg total) by mouth daily.   PROBIOTIC PO Take by mouth.   rosuvastatin  5 MG tablet Commonly known as: CRESTOR  Take 1 tablet (5 mg total) by mouth at bedtime.   valACYclovir  1000 MG tablet Commonly known as: VALTREX  2gm twice a day for 24h for symptoms         Objective:   BP 137/79   Pulse 92   Ht 5' 5 (1.651 m)   Wt 163 lb (73.9 kg)   SpO2 96%   BMI 27.12 kg/m   Wt Readings from Last 3 Encounters:  07/06/23 163 lb (73.9 kg)  05/22/23 162 lb 12.8 oz (73.8 kg)  03/30/23 162 lb (73.5 kg)    Physical Exam Vitals and nursing note reviewed.  Constitutional:      General: She is not in acute distress.    Appearance: She is well-developed. She is not diaphoretic.  Eyes:     Conjunctiva/sclera: Conjunctivae normal.  Cardiovascular:     Rate and Rhythm: Normal rate and regular rhythm.     Heart sounds: Normal heart sounds. No murmur heard. Pulmonary:     Effort: Pulmonary effort is normal. No respiratory distress.     Breath sounds: Normal breath sounds. No wheezing, rhonchi or rales.  Musculoskeletal:        General: No swelling.  Skin:    General: Skin is warm and dry.     Findings: No rash.  Neurological:     Mental Status: She is alert and oriented to person, place, and time.     Coordination: Coordination normal.  Psychiatric:        Behavior: Behavior normal.       Assessment & Plan:   Problem List Items Addressed This Visit       Cardiovascular and Mediastinum   Essential hypertension   Relevant Medications   losartan  (COZAAR ) 100 MG tablet     Digestive   GERD   Relevant Medications   pantoprazole  (PROTONIX ) 40 MG tablet     Genitourinary   Chronic kidney disease (CKD) stage G3b/A1, moderately decreased glomerular filtration rate (GFR) between 30-44 mL/min/1.73 square meter and albuminuria creatinine ratio less than 30 mg/g (HCC)   Relevant Orders   CBC with Differential/Platelet    CMP14+EGFR   Chronic kidney disease due to hypertension   Relevant Orders   CBC with Differential/Platelet   CMP14+EGFR     Other   Hyperlipidemia - Primary   Relevant Medications   losartan  (COZAAR ) 100 MG tablet   Other Relevant Orders   CMP14+EGFR   Lipid panel   Other Visit Diagnoses       Herpes labialis       Relevant Medications   valACYclovir  (VALTREX ) 1000 MG tablet     Palpitations       Relevant Orders   LONG TERM MONITOR XT (  3-14 DAYS)     Exertional shortness of breath       Relevant Orders   LONG TERM MONITOR XT (3-14 DAYS)     Encounter for screening mammogram for malignant neoplasm of breast       Relevant Orders   MM 3D DIAGNOSTIC MAMMOGRAM BILATERAL BREAST       Will put a Zio patch on the patient to wear for 2 weeks and if everything looks normal with that then we will send her to pulmonology.  She has already had cardiac testing. Follow up plan: Return in about 6 months (around 01/06/2024), or if symptoms worsen or fail to improve, for Physical exam hypertension and hyperlipidemia.  Counseling provided for all of the vaccine components Orders Placed This Encounter  Procedures   MM 3D DIAGNOSTIC MAMMOGRAM BILATERAL BREAST   CBC with Differential/Platelet   CMP14+EGFR   Lipid panel   LONG TERM MONITOR XT (3-14 DAYS)    Fonda Levins, MD [facility] 07/06/2023, 8:59 AM

## 2023-07-07 LAB — CBC WITH DIFFERENTIAL/PLATELET
Basophils Absolute: 0.1 x10E3/uL (ref 0.0–0.2)
Basos: 1 %
EOS (ABSOLUTE): 0.5 x10E3/uL — ABNORMAL HIGH (ref 0.0–0.4)
Eos: 5 %
Hematocrit: 41.1 % (ref 34.0–46.6)
Hemoglobin: 13 g/dL (ref 11.1–15.9)
Immature Grans (Abs): 0 x10E3/uL (ref 0.0–0.1)
Immature Granulocytes: 0 %
Lymphocytes Absolute: 3.4 x10E3/uL — ABNORMAL HIGH (ref 0.7–3.1)
Lymphs: 32 %
MCH: 29.2 pg (ref 26.6–33.0)
MCHC: 31.6 g/dL (ref 31.5–35.7)
MCV: 92 fL (ref 79–97)
Monocytes Absolute: 1.1 x10E3/uL — ABNORMAL HIGH (ref 0.1–0.9)
Monocytes: 10 %
Neutrophils Absolute: 5.5 x10E3/uL (ref 1.4–7.0)
Neutrophils: 52 %
Platelets: 268 x10E3/uL (ref 150–450)
RBC: 4.45 x10E6/uL (ref 3.77–5.28)
RDW: 13.2 % (ref 11.7–15.4)
WBC: 10.5 x10E3/uL (ref 3.4–10.8)

## 2023-07-07 LAB — CMP14+EGFR
ALT: 14 IU/L (ref 0–32)
AST: 23 IU/L (ref 0–40)
Albumin: 4.1 g/dL (ref 3.8–4.8)
Alkaline Phosphatase: 72 IU/L (ref 44–121)
BUN/Creatinine Ratio: 16 (ref 12–28)
BUN: 18 mg/dL (ref 8–27)
Bilirubin Total: 0.6 mg/dL (ref 0.0–1.2)
CO2: 22 mmol/L (ref 20–29)
Calcium: 9.8 mg/dL (ref 8.7–10.3)
Chloride: 104 mmol/L (ref 96–106)
Creatinine, Ser: 1.16 mg/dL — ABNORMAL HIGH (ref 0.57–1.00)
Globulin, Total: 2.7 g/dL (ref 1.5–4.5)
Glucose: 129 mg/dL — ABNORMAL HIGH (ref 70–99)
Potassium: 4.8 mmol/L (ref 3.5–5.2)
Sodium: 142 mmol/L (ref 134–144)
Total Protein: 6.8 g/dL (ref 6.0–8.5)
eGFR: 48 mL/min/1.73 — ABNORMAL LOW (ref >59)

## 2023-07-07 LAB — LIPID PANEL
Chol/HDL Ratio: 2.6 ratio (ref 0.0–4.4)
Cholesterol, Total: 143 mg/dL (ref 100–199)
HDL: 56 mg/dL (ref >39)
LDL Chol Calc (NIH): 66 mg/dL (ref 0–99)
Triglycerides: 121 mg/dL (ref 0–149)
VLDL Cholesterol Cal: 21 mg/dL (ref 5–40)

## 2023-07-10 ENCOUNTER — Ambulatory Visit: Payer: Self-pay | Admitting: Family Medicine

## 2023-07-25 DIAGNOSIS — R0602 Shortness of breath: Secondary | ICD-10-CM | POA: Diagnosis not present

## 2023-07-25 DIAGNOSIS — R002 Palpitations: Secondary | ICD-10-CM | POA: Diagnosis not present

## 2023-07-31 ENCOUNTER — Telehealth: Payer: Self-pay

## 2023-07-31 NOTE — Telephone Encounter (Signed)
 Pt changed her mind and wanted to come Wednesday July 30.

## 2023-07-31 NOTE — Telephone Encounter (Signed)
 Copied from CRM [id number]. Topic: Appointments - Appointment Cancel/Reschedule >> Jul 28, 2023  4:51 PM Nathanel BROCKS wrote: Patient/patient representative is calling to cancel or reschedule an appointment. Refer to attachments for appointment information.    Pt had to cancel mammo on Wednesday could you please call to reschedule.

## 2023-08-01 ENCOUNTER — Other Ambulatory Visit: Payer: Self-pay | Admitting: Nurse Practitioner

## 2023-08-01 DIAGNOSIS — Z1231 Encounter for screening mammogram for malignant neoplasm of breast: Secondary | ICD-10-CM

## 2023-08-02 ENCOUNTER — Ambulatory Visit
Admission: RE | Admit: 2023-08-02 | Discharge: 2023-08-02 | Disposition: A | Discharge status: Home / Self Care | Source: Ambulatory Visit | Attending: Family Medicine | Admitting: Family Medicine

## 2023-08-02 ENCOUNTER — Encounter

## 2023-08-02 DIAGNOSIS — Z1231 Encounter for screening mammogram for malignant neoplasm of breast: Secondary | ICD-10-CM | POA: Diagnosis not present

## 2023-08-18 DIAGNOSIS — H43813 Vitreous degeneration, bilateral: Secondary | ICD-10-CM | POA: Diagnosis not present

## 2023-08-20 NOTE — Progress Notes (Unsigned)
 Cardiology Office Note   Date:  08/22/2023  ID:  Stacy Henry, Stacy Henry November 01, 1945, MRN [id number] PCP: Dettinger, Fonda LABOR, MD  [facility] Providers Cardiologist:  Newman JINNY Lawrence, MD   History of Present Illness Stacy Henry is a 78 y.o. female with a past medical history of hypertension, hyperlipidemia, family history of early CAD, pericardial pain, exertional dyspnea here for follow-up appointment.  Patient was last seen 05/22/2023.  At that time she was having shortness of breath that was persistent for last 6 months worsening recently.  She also described a flutter for the second during activities like walking requiring deep breaths occurring multiple times a day and improvement with rest.  Chest pressure was noted especially when lying flat and relieved by head elevation, leading her to sleep and recliner for the past month.  Chest heaviness also improved with physical activity.  Lightheadedness occurs with walking and using stairs, lasting few seconds and improving with rest.  New ankle swelling was observed as well.  Her sugar is high with limited red meat and salt intake.  She consumes home-cooked meals using fresh and canned foods.  She usually drains the canned foods before cooking.  Significant history in her family of heart disease with many relatives dying young from heart attacks.  She does not smoke and remains active in her community and church.  Today, she with hypertension who presents with palpitations and ankle swelling.  She experiences palpitations described as a fluttering sensation, particularly during exertion such as walking up and down steps, present since at least May. Symptoms improve with rest. A monitor placed on July 22 revealed less than 1% PVCs and 13 episodes of supraventricular tachycardia. She has not been on consistent beta blocker therapy, having only taken one dose during a CT scan. An echocardiogram revealed a mild aortic valve leak.  She has not experienced atrial fibrillation.  She is currently on losartan  100 mg for hypertension, which is typically high at home. A recent blood pressure reading was 178/90, which later normalized. No headaches associated with her high blood pressure readings.  She reports new onset swelling in her ankles over the past three weeks, significantly noticed this morning.  She has skipped wearing shoes this past 2 days due to the swelling.  The swelling has been persistent.  Reports no shortness of breath nor dyspnea on exertion. Reports no chest pain, pressure, or tightness. No edema, orthopnea, PND.  Discussed the use of AI scribe software for clinical note transcription with the patient, who gave verbal consent to proceed.   ROS: Pertinent ROS in HPI  Studies Reviewed  Coronary CTA June 2025 with a calcium  score of 0 and no CAD noted  Echocardiogram 06/23/2023 showed LVEF 65% with no regional wall motion abnormality.  Mild left ventricular perjury.  Trivial mitral valve regurgitation.  Mild aortic valve regurgitation.     Cardiac monitor 07/06/2023 Patch Wear Time:  13 days and 9 hours (2025-07-03T09:13:04-398 to 2025-07-16T19:03:47-0400)   Patient had a min HR of 54 bpm, max HR of 218 bpm, and avg HR of 88 bpm. Predominant underlying rhythm was Sinus Rhythm. 13 Supraventricular Tachycardia runs occurred, the run with the fastest interval lasting 4 beats with a max rate of 218 bpm, the  longest lasting 8 beats with an avg rate of 92 bpm. Isolated SVEs were rare (<1.0%), SVE Couplets were rare (<1.0%), and SVE Triplets were rare (<1.0%). Isolated VEs were rare (<1.0%), and no VE Couplets or VE Triplets  were present.    Patient had tachycardia or high heart rate a few times, likely with activity.  She had minimal low heart rate of 54 which can be normal.  She does have some PVCs frequently and that is likely what is causing the palpitation symptoms but those are of no concern and we do not have  to treat unless they are too symptomatic and then we could give a medication to help with the symptoms but found nothing concerning. Fonda Levins, MD [facility] 08/11/2023, 8:40 AM    Physical Exam VS:  BP (!) 158/84 (BP Location: Left Arm)   Pulse 72   Ht 5' 5 (1.651 m)   Wt 166 lb 9.6 oz (75.6 kg)   SpO2 97%   BMI 27.72 kg/m        Wt Readings from Last 3 Encounters:  08/22/23 166 lb 9.6 oz (75.6 kg)  07/06/23 163 lb (73.9 kg)  05/22/23 162 lb 12.8 oz (73.8 kg)    GEN: Well nourished, well developed in no acute distress NECK: No JVD; No carotid bruits CARDIAC: RRR, no murmurs, rubs, gallops RESPIRATORY:  Clear to auscultation without rales, wheezing or rhonchi  ABDOMEN: Soft, non-tender, non-distended EXTREMITIES:  1+ bilateral pitting edema; No deformity   ASSESSMENT AND PLAN  Palpitations due to premature ventricular contractions and supraventricular tachycardia Palpitations due to PVCs and non-sustained SVT. No atrial fibrillation detected, ruling out significant stroke risk. Symptoms mild, occur with exertion. - Monitor symptoms and report if bothersome. - Consider low-dose beta blocker if symptoms worsen.  Essential hypertension Hypertension not controlled on losartan  100 mg. Discussed switching to valsartan  for better control and dosage flexibility. Emphasized monitoring kidney function due to renal effects. - Switch to valsartan  160 mg daily.  Add HCTZ 12.5 mg daily for lower extremity edema - Order kidney function tests in two weeks. - Advise taking valsartan  in the morning.  Bilateral lower extremity pitting edema New bilateral lower extremity pitting edema for three weeks. Discussed dependent edema versus pharmacological intervention. Consider low-dose diuretic with kidney function monitoring. - Prescribed HCTZ 12.5 mg daily in addition to valsartan  160 mg daily - Monitor kidney function in two weeks. - Evaluate edema response to  diuretic therapy. - BMP in 2 weeks to assess kidney function -Encouraged her to wear lower extremity compression when she is going to be up on her feet much throughout the day - Elevate legs when able  Mild aortic valve regurgitation Echocardiogram shows mild aortic valve regurgitation with normal heart function. Common with aging, may not progress. No immediate intervention required. - Continue routine monitoring with echocardiograms as needed.   Dispo: She can follow-up in 8 months with Dr. Kate  Signed, Orren LOISE Fabry, PA-C

## 2023-08-22 ENCOUNTER — Ambulatory Visit: Attending: Physician Assistant | Admitting: Physician Assistant

## 2023-08-22 VITALS — BP 158/84 | HR 72 | Ht 65.0 in | Wt 166.6 lb

## 2023-08-22 DIAGNOSIS — Z79899 Other long term (current) drug therapy: Secondary | ICD-10-CM

## 2023-08-22 DIAGNOSIS — I10 Essential (primary) hypertension: Secondary | ICD-10-CM | POA: Diagnosis not present

## 2023-08-22 DIAGNOSIS — R600 Localized edema: Secondary | ICD-10-CM | POA: Diagnosis not present

## 2023-08-22 DIAGNOSIS — R072 Precordial pain: Secondary | ICD-10-CM

## 2023-08-22 DIAGNOSIS — R0609 Other forms of dyspnea: Secondary | ICD-10-CM | POA: Diagnosis not present

## 2023-08-22 MED ORDER — VALSARTAN-HYDROCHLOROTHIAZIDE 160-12.5 MG PO TABS: 1.0000 | ORAL_TABLET | Freq: Every day | ORAL | 3 refills | Status: DC

## 2023-08-22 NOTE — Patient Instructions (Signed)
 Medication Instructions:  Your physician has recommended you make the following change in your medication:  STOP LOSARTAN  START VALSARTAN -HYDROCHLOROTHIAZIDE  160-12.5 DAILY   *If you need a refill on your cardiac medications before your next appointment, please call your pharmacy*  Lab Work: TO BE DONE IN 2 WEEKS: BMET If you have labs (blood work) drawn today and your tests are completely normal, you will receive your results only by: MyChart Message (if you have MyChart) OR A paper copy in the mail If you have any lab test that is abnormal or we need to change your treatment, we will call you to review the results.  Testing/Procedures: NONE  Follow-Up: At [facility], you and your health needs are our priority.  As part of our continuing mission to provide you with exceptional heart care, our providers are all part of one team.  This team includes your primary Cardiologist (physician) and Advanced Practice Providers or APPs (Physician Assistants and Nurse Practitioners) who all work together to provide you with the care you need, when you need it.  Your next appointment:   8 month(s)  Provider:   DR. KATE   We recommend signing up for the patient portal called MyChart.  Sign up information is provided on this After Visit Summary.  MyChart is used to connect with patients for Virtual Visits (Telemedicine).  Patients are able to view lab/test results, encounter notes, upcoming appointments, etc.  Non-urgent messages can be sent to your provider as well.   To learn more about what you can do with MyChart, go to [email or web address].   Other Instructions Please check your blood pressure 1-2 times per day for 2 weeks and send readings to [facility], PA-C.

## 2023-08-23 ENCOUNTER — Ambulatory Visit: Admitting: Cardiology

## 2023-10-23 ENCOUNTER — Other Ambulatory Visit

## 2023-10-23 ENCOUNTER — Ambulatory Visit

## 2023-11-07 ENCOUNTER — Other Ambulatory Visit

## 2023-11-07 ENCOUNTER — Ambulatory Visit (INDEPENDENT_AMBULATORY_CARE_PROVIDER_SITE_OTHER): Payer: Self-pay | Admitting: *Deleted

## 2023-11-07 ENCOUNTER — Other Ambulatory Visit: Payer: Self-pay

## 2023-11-07 DIAGNOSIS — Z23 Encounter for immunization: Secondary | ICD-10-CM

## 2023-11-08 LAB — BASIC METABOLIC PANEL WITH GFR
BUN/Creatinine Ratio: 19 (ref 12–28)
BUN: 26 mg/dL (ref 8–27)
CO2: 23 mmol/L (ref 20–29)
Calcium: 9.5 mg/dL (ref 8.7–10.3)
Chloride: 104 mmol/L (ref 96–106)
Creatinine, Ser: 1.34 mg/dL — AB (ref 0.57–1.00)
Glucose: 107 mg/dL — AB (ref 70–99)
Potassium: 4.4 mmol/L (ref 3.5–5.2)
Sodium: 143 mmol/L (ref 134–144)
eGFR: 40 mL/min/1.73 — AB (ref >59)

## 2023-11-11 ENCOUNTER — Telehealth: Admitting: Family Medicine

## 2023-11-11 DIAGNOSIS — R399 Unspecified symptoms and signs involving the genitourinary system: Secondary | ICD-10-CM

## 2023-11-11 MED ORDER — CEPHALEXIN 500 MG PO CAPS: 500.0000 mg | ORAL_CAPSULE | Freq: Two times a day (BID) | ORAL | 0 refills | Status: AC

## 2023-11-11 NOTE — Progress Notes (Signed)

## 2023-11-13 ENCOUNTER — Other Ambulatory Visit: Payer: Self-pay

## 2023-11-13 NOTE — Progress Notes (Signed)
 Pharmacy Quality Measure Review  This patient is appearing on a report for being at risk of failing the adherence measure for cholesterol (statin) medications this calendar year.   Medication: rosuvastatin  5 mg Last fill date: 06/28/23 for 90 day supply  Verified fill record with [facility]. They confirmed that the last fill was for a 90 day supply on 06/28/23 with refills remaining. They are in the process of refilling her medication.   Spoke with Gayle over the phone in regard to medication management. She states that she has been taking rosuvastatin  on and off. Reports feeling washed out after taking it but denies any symptoms of muscle cramps or pains. Encouraged her to retry taking rosuvastatin  daily and reassess symptoms. Counseled on calling the office back if she continued to have symptoms so that we could possibly trial a different medication. She is amenable to retrying statin therapy and states that she will call back if she has any issues or concerns.   Woodie Jock, PharmD PGY1 Pharmacy Resident  11/13/2023

## 2023-11-15 ENCOUNTER — Ambulatory Visit: Payer: Self-pay | Admitting: Physician Assistant

## 2023-11-15 DIAGNOSIS — R0609 Other forms of dyspnea: Secondary | ICD-10-CM

## 2023-11-15 DIAGNOSIS — I10 Essential (primary) hypertension: Secondary | ICD-10-CM

## 2023-11-15 DIAGNOSIS — Z79899 Other long term (current) drug therapy: Secondary | ICD-10-CM

## 2023-11-23 NOTE — Progress Notes (Signed)
 Called patient, NA left message to contact our office.

## 2023-11-23 NOTE — Progress Notes (Signed)
 Patient advised to track her BP at least daily and 1 hour after taking medication. Patient aware.

## 2023-11-24 ENCOUNTER — Other Ambulatory Visit

## 2023-11-24 DIAGNOSIS — Z79899 Other long term (current) drug therapy: Secondary | ICD-10-CM

## 2023-11-24 DIAGNOSIS — I10 Essential (primary) hypertension: Secondary | ICD-10-CM

## 2023-11-24 DIAGNOSIS — R0609 Other forms of dyspnea: Secondary | ICD-10-CM

## 2023-11-24 LAB — BASIC METABOLIC PANEL WITH GFR
BUN/Creatinine Ratio: 23 (ref 12–28)
BUN: 31 mg/dL — ABNORMAL HIGH (ref 8–27)
CO2: 23 mmol/L (ref 20–29)
Calcium: 9.5 mg/dL (ref 8.7–10.3)
Chloride: 105 mmol/L (ref 96–106)
Creatinine, Ser: 1.37 mg/dL — ABNORMAL HIGH (ref 0.57–1.00)
Glucose: 98 mg/dL (ref 70–99)
Potassium: 5.1 mmol/L (ref 3.5–5.2)
Sodium: 141 mmol/L (ref 134–144)
eGFR: 39 mL/min/1.73 — ABNORMAL LOW (ref >59)

## 2023-11-28 ENCOUNTER — Ambulatory Visit: Payer: Self-pay | Admitting: Physician Assistant

## 2023-12-21 ENCOUNTER — Ambulatory Visit: Payer: Self-pay

## 2023-12-21 VITALS — BP 158/84 | HR 72 | Ht 65.0 in | Wt 166.0 lb

## 2023-12-21 DIAGNOSIS — Z0000 Encounter for general adult medical examination without abnormal findings: Secondary | ICD-10-CM

## 2023-12-21 NOTE — Progress Notes (Signed)
 Chief Complaint  Patient presents with   Medicare Wellness     Subjective:   Stacy Henry is a 78 y.o. female who presents for a Medicare Annual Wellness Visit.  Visit info / Clinical Intake: Medicare Wellness Visit Type:: Subsequent Annual Wellness Visit Persons participating in visit and providing information:: patient Medicare Wellness Visit Mode:: Telephone If telephone:: video declined Since this visit was completed virtually, some vitals may be partially provided or unavailable. Missing vitals are due to the limitations of the virtual format.: Unable to obtain vitals - no equipment (last ov w/Cardio 08/22/23) If Telephone or Video please confirm:: I connected with patient using audio/video enable telemedicine. I verified patient identity with two identifiers, discussed telehealth limitations, and patient agreed to proceed. Patient Location:: home Provider Location:: home office Interpreter Needed?: No Pre-visit prep was completed: yes AWV questionnaire completed by patient prior to visit?: no Living arrangements:: lives with spouse/significant other Patient's Overall Health Status Rating: very good Typical amount of pain: none Does pain affect daily life?: no Are you currently prescribed opioids?: no  Dietary Habits and Nutritional Risks How many meals a day?: 2 Eats fruit and vegetables daily?: yes Most meals are obtained by: preparing own meals In the last 2 weeks, have you had any of the following?: none Diabetic:: no  Functional Status Activities of Daily Living (to include ambulation/medication): Independent Ambulation: Independent Medication Administration: Independent Home Management (perform basic housework or laundry): Independent Manage your own finances?: yes Primary transportation is: driving Concerns about vision?: no *vision screening is required for WTM* (last ov 2024) Concerns about hearing?: no  Fall Screening Falls in the past year?:  0 Number of falls in past year: 0 Was there an injury with Fall?: 0 Fall Risk Category Calculator: 0 Patient Fall Risk Level: Low Fall Risk  Fall Risk Patient at Risk for Falls Due to: No Fall Risks Fall risk Follow up: Falls evaluation completed; Education provided  Home and Transportation Safety: All rugs have non-skid backing?: yes All stairs or steps have railings?: yes Grab bars in the bathtub or shower?: yes Have non-skid surface in bathtub or shower?: yes Good home lighting?: yes Regular seat belt use?: yes Hospital stays in the last year:: no  Cognitive Assessment Difficulty concentrating, remembering, or making decisions? : no Will 6CIT or Mini Cog be Completed: yes What year is it?: 0 points What month is it?: 0 points Give patient an address phrase to remember (5 components): [address] [place] what time is it?: 0 points Count backwards from 20 to 1: 0 points Say the months of the year in reverse: 0 points Repeat the address phrase from earlier: 0 points 6 CIT Score: 0 points  Advance Directives (For Healthcare) Does Patient Have a Medical Advance Directive?: No Would patient like information on creating a medical advance directive?: No - Patient declined  Reviewed/Updated  Reviewed/Updated: Reviewed All (Medical, Surgical, Family, Medications, Allergies, Care Teams, Patient Goals); Medical History; Surgical History; Family History; Medications; Allergies; Care Teams; Patient Goals    Allergies (verified) Sulfa antibiotics   Current Medications (verified) Outpatient Encounter Medications as of 12/21/2023  Medication Sig   fluticasone  (FLONASE ) 50 MCG/ACT nasal spray Place 2 sprays into both nostrils daily.   Multiple Vitamin (MULTIVITAMIN) tablet Take 1 tablet by mouth daily.   pantoprazole  (PROTONIX ) 40 MG tablet Take 1 tablet (40 mg total) by mouth daily.   rosuvastatin  (CRESTOR ) 5 MG tablet Take 1 tablet (5 mg total) by mouth  at bedtime.    valACYclovir  (VALTREX ) 1000 MG tablet 2gm twice a day for 24h for symptoms   valsartan -hydrochlorothiazide  (DIOVAN  HCT) 160-12.5 MG tablet Take 1 tablet by mouth daily.   Probiotic Product (PROBIOTIC PO) Take by mouth. (Patient not taking: Reported on 12/21/2023)   [DISCONTINUED] aspirin  EC 81 MG tablet Take 1 tablet (81 mg total) by mouth daily. Swallow whole. (Patient not taking: Reported on 08/22/2023)   [DISCONTINUED] metoprolol  tartrate (LOPRESSOR ) 100 MG tablet Take 1 tablet (100 mg total) by mouth as directed. Take one tablet (2) hours before your CT scan (Patient not taking: Reported on 08/22/2023)   No facility-administered encounter medications on file as of 12/21/2023.    History: Past Medical History:  Diagnosis Date   Allergy    GERD (gastroesophageal reflux disease)    HTN (hypertension)    Hyperlipidemia    Insomnia    Ischemic colitis 2011   Osteopenia    Past Surgical History:  Procedure Laterality Date   APPENDECTOMY  as teenager   BREAST BIOPSY Bilateral    years ago benign   BREAST EXCISIONAL BIOPSY Right    years ago benign   BREAST SURGERY Bilateral    lumpremoval   COLONOSCOPY  2006   RMR: normal.   COLONOSCOPY  2012   [facility] : most c/w ischemic colitis, no path   COLONOSCOPY N/A 12/03/2014   Procedure: COLONOSCOPY;  Surgeon: Lamar CHRISTELLA Hollingshead, MD;  Location: [facility];  Service: Endoscopy;  Laterality: N/A;  0900   EYE SURGERY Bilateral    FINGER SURGERY     Family History  Problem Relation Age of Onset   Alcohol abuse Sister    Alcoholism Sister    Diabetes Brother    Heart attack Brother 35   Aneurysm Brother    Alcoholism Brother    Diabetes Brother    Cirrhosis Brother    Alcohol abuse Brother    Alcoholism Brother    Hypertension Mother    Atrial fibrillation Mother    Macular degeneration Mother    Heart attack Father 47   Kidney disease Sister    Atrial fibrillation Sister    Colon cancer Neg Hx    Social  History   Occupational History   Occupation: Technical Sales Engineer  Tobacco Use   Smoking status: Never   Smokeless tobacco: Never  Vaping Use   Vaping status: Never Used  Substance and Sexual Activity   Alcohol use: Yes    Comment: ocassional wine - less than weekly   Drug use: No   Sexual activity: Yes    Partners: Male    Birth control/protection: Post-menopausal   Tobacco Counseling Counseling given: Yes  SDOH Screenings   Food Insecurity: No Food Insecurity (12/21/2023)  Housing: Unknown (12/21/2023)  Transportation Needs: No Transportation Needs (12/21/2023)  Utilities: Not At Risk (12/21/2023)  Alcohol Screen: Low Risk (10/12/2022)  Depression (PHQ2-9): Low Risk (12/21/2023)  Financial Resource Strain: Low Risk (10/12/2022)  Physical Activity: Insufficiently Active (12/21/2023)  Social Connections: Socially Integrated (12/21/2023)  Stress: No Stress Concern Present (12/21/2023)  Tobacco Use: Low Risk (12/21/2023)  Health Literacy: Adequate Health Literacy (12/21/2023)   See flowsheets for full screening details  Depression Screen PHQ 2 & 9 Depression Scale- Over the past 2 weeks, how often have you been bothered by any of the following problems? Little interest or pleasure in doing things: 0 Feeling down, depressed, or hopeless (PHQ Adolescent also includes...irritable): 0 PHQ-2 Total Score: 0  Goals Addressed             This Visit's Progress    Patient Stated   On track    08/20/21 - I hope to stay as healthy and active as I already am and help get Ray out of his slump             Objective:    Today's Vitals   12/21/23 1006  BP: (!) 158/84  Pulse: 72  Weight: 166 lb (75.3 kg)  Height: 5' 5 (1.651 m)   Body mass index is 27.62 kg/m.  Hearing/Vision screen No results found. Immunizations and Health Maintenance Health Maintenance  Topic Date Due   Zoster Vaccines- Shingrix  (2 of 2) 08/31/2023   COVID-19 Vaccine (4 - 2025-26  season) 09/04/2023   Bone Density Scan  07/05/2024   Mammogram  08/01/2024   Medicare Annual Wellness (AWV)  12/20/2024   DTaP/Tdap/Td (3 - Td or Tdap) 09/15/2032   Pneumococcal Vaccine: 50+ Years  Completed   Influenza Vaccine  Completed   Hepatitis C Screening  Completed   Meningococcal B Vaccine  Aged Out   Colonoscopy  Discontinued        Assessment/Plan:  This is a routine wellness examination for Taelar.  Patient Care Team: Dettinger, Fonda LABOR, MD as PCP - General (Family Medicine) Dettinger, Fonda LABOR, MD as PCP - Family Medicine (Family Medicine) Elmira Newman PARAS, MD as PCP - Cardiology (Cardiology) Ivin Kocher, MD as Consulting Physician (Dermatology) Shaaron Lamar HERO, MD as Consulting Physician (Gastroenterology)  I have personally reviewed and noted the following in the patients chart:   Medical and social history Use of alcohol, tobacco or illicit drugs  Current medications and supplements including opioid prescriptions. Functional ability and status Nutritional status Physical activity Advanced directives List of other physicians Hospitalizations, surgeries, and ER visits in previous 12 months Vitals Screenings to include cognitive, depression, and falls Referrals and appointments  No orders of the defined types were placed in this encounter.  In addition, I have reviewed and discussed with patient certain preventive protocols, quality metrics, and best practice recommendations. A written personalized care plan for preventive services as well as general preventive health recommendations were provided to patient.   Ozie Ned, CMA   12/21/2023   Return in 1 year (on 12/20/2024).  After Visit Summary: (MyChart) Due to this being a telephonic visit, the after visit summary with patients personalized plan was offered to patient via MyChart   Nurse Notes: Pt declined Covid, will get the 2nd Shingles vaccine at next OV w/pcp

## 2023-12-21 NOTE — Patient Instructions (Signed)
 Ms. Osoria,  Thank you for taking the time for your Medicare Wellness Visit. I appreciate your continued commitment to your health goals. Please review the care plan we discussed, and feel free to reach out if I can assist you further.  Please note that Annual Wellness Visits do not include a physical exam. Some assessments may be limited, especially if the visit was conducted virtually. If needed, we may recommend an in-person follow-up with your provider.  Ongoing Care Seeing your primary care provider every 3 to 6 months helps us  monitor your health and provide consistent, personalized care.   Referrals If a referral was made during today's visit and you haven't received any updates within two weeks, please contact the referred provider directly to check on the status.  Recommended Screenings:  Health Maintenance  Topic Date Due   Zoster (Shingles) Vaccine (2 of 2) 08/31/2023   COVID-19 Vaccine (4 - 2025-26 season) 09/04/2023   Medicare Annual Wellness Visit  10/12/2023   Osteoporosis screening with Bone Density Scan  07/05/2024   Breast Cancer Screening  08/01/2024   DTaP/Tdap/Td vaccine (3 - Td or Tdap) 09/15/2032   Pneumococcal Vaccine for age over 32  Completed   Flu Shot  Completed   Hepatitis C Screening  Completed   Meningitis B Vaccine  Aged Out   Colon Cancer Screening  Discontinued       12/21/2023    9:56 AM  Advanced Directives  Does Patient Have a Medical Advance Directive? No  Would patient like information on creating a medical advance directive? No - Patient declined    Vision: Annual vision screenings are recommended for early detection of glaucoma, cataracts, and diabetic retinopathy. These exams can also reveal signs of chronic conditions such as diabetes and high blood pressure.  Dental: Annual dental screenings help detect early signs of oral cancer, gum disease, and other conditions linked to overall health, including heart disease and  diabetes.  Please see the attached documents for additional preventive care recommendations.

## 2024-01-03 ENCOUNTER — Other Ambulatory Visit: Payer: Self-pay | Admitting: Physician Assistant

## 2024-01-10 ENCOUNTER — Encounter: Payer: Self-pay | Admitting: Family Medicine

## 2024-03-06 ENCOUNTER — Encounter: Admitting: Family Medicine
